# Patient Record
Sex: Female | Born: 1965
Health system: Southern US, Community
[De-identification: ages and names within clinical notes are randomized; demographics above are authoritative.]

## PROBLEM LIST (undated history)

## (undated) DIAGNOSIS — E785 Hyperlipidemia, unspecified: Secondary | ICD-10-CM

## (undated) DIAGNOSIS — F329 Major depressive disorder, single episode, unspecified: Secondary | ICD-10-CM

## (undated) DIAGNOSIS — I10 Essential (primary) hypertension: Secondary | ICD-10-CM

## (undated) DIAGNOSIS — K219 Gastro-esophageal reflux disease without esophagitis: Secondary | ICD-10-CM

## (undated) DIAGNOSIS — F32A Depression, unspecified: Secondary | ICD-10-CM

## (undated) HISTORY — PX: VESICOVAGINAL FISTULA CLOSURE W/ TAH: SUR271

## (undated) HISTORY — PX: GANGLION CYST EXCISION: SHX1691

## (undated) HISTORY — DX: Depression, unspecified: F32.A

## (undated) HISTORY — PX: ROTATOR CUFF REPAIR: SHX139

## (undated) HISTORY — PX: LUMBAR MICRODISCECTOMY: SHX99

## (undated) HISTORY — DX: Gastro-esophageal reflux disease without esophagitis: K21.9

## (undated) HISTORY — DX: Hyperlipidemia, unspecified: E78.5

## (undated) HISTORY — PX: ABDOMINAL HYSTERECTOMY: SHX81

## (undated) HISTORY — PX: MARSUPIALIZATION URETHRAL DIVERTICULUM: SUR844

## (undated) HISTORY — DX: Essential (primary) hypertension: I10

## (undated) HISTORY — DX: Major depressive disorder, single episode, unspecified: F32.9

---

## 1997-11-07 ENCOUNTER — Encounter: Admission: RE | Admit: 1997-11-07 | Discharge: 1998-02-05 | Payer: Self-pay | Admitting: Orthopedic Surgery

## 1999-01-22 ENCOUNTER — Other Ambulatory Visit: Admission: RE | Admit: 1999-01-22 | Discharge: 1999-01-22 | Payer: Self-pay | Admitting: Obstetrics and Gynecology

## 1999-02-18 ENCOUNTER — Emergency Department (HOSPITAL_COMMUNITY): Admission: EM | Admit: 1999-02-18 | Discharge: 1999-02-18 | Payer: Self-pay | Admitting: *Deleted

## 1999-02-18 ENCOUNTER — Encounter: Payer: Self-pay | Admitting: *Deleted

## 1999-05-26 ENCOUNTER — Other Ambulatory Visit: Admission: RE | Admit: 1999-05-26 | Discharge: 1999-05-26 | Payer: Self-pay | Admitting: Obstetrics and Gynecology

## 1999-07-20 ENCOUNTER — Encounter: Payer: Self-pay | Admitting: Orthopedic Surgery

## 1999-07-20 ENCOUNTER — Encounter: Admission: RE | Admit: 1999-07-20 | Discharge: 1999-07-20 | Payer: Self-pay | Admitting: Orthopedic Surgery

## 1999-08-26 ENCOUNTER — Encounter: Payer: Self-pay | Admitting: Orthopedic Surgery

## 1999-08-28 ENCOUNTER — Encounter (INDEPENDENT_AMBULATORY_CARE_PROVIDER_SITE_OTHER): Payer: Self-pay | Admitting: *Deleted

## 1999-08-28 ENCOUNTER — Ambulatory Visit (HOSPITAL_COMMUNITY): Admission: RE | Admit: 1999-08-28 | Discharge: 1999-08-30 | Payer: Self-pay | Admitting: Orthopedic Surgery

## 2000-02-12 ENCOUNTER — Other Ambulatory Visit: Admission: RE | Admit: 2000-02-12 | Discharge: 2000-02-12 | Payer: Self-pay | Admitting: *Deleted

## 2000-04-25 ENCOUNTER — Encounter: Payer: Self-pay | Admitting: *Deleted

## 2000-04-25 ENCOUNTER — Ambulatory Visit (HOSPITAL_COMMUNITY): Admission: RE | Admit: 2000-04-25 | Discharge: 2000-04-25 | Payer: Self-pay | Admitting: *Deleted

## 2000-07-11 ENCOUNTER — Encounter: Payer: Self-pay | Admitting: Obstetrics and Gynecology

## 2000-07-11 ENCOUNTER — Ambulatory Visit (HOSPITAL_COMMUNITY): Admission: RE | Admit: 2000-07-11 | Discharge: 2000-07-11 | Payer: Self-pay | Admitting: Obstetrics and Gynecology

## 2000-07-22 ENCOUNTER — Inpatient Hospital Stay (HOSPITAL_COMMUNITY): Admission: AD | Admit: 2000-07-22 | Discharge: 2000-07-22 | Payer: Self-pay | Admitting: Obstetrics and Gynecology

## 2000-08-02 ENCOUNTER — Inpatient Hospital Stay (HOSPITAL_COMMUNITY): Admission: AD | Admit: 2000-08-02 | Discharge: 2000-08-02 | Payer: Self-pay | Admitting: Obstetrics and Gynecology

## 2000-08-12 ENCOUNTER — Ambulatory Visit (HOSPITAL_COMMUNITY): Admission: RE | Admit: 2000-08-12 | Discharge: 2000-08-12 | Payer: Self-pay | Admitting: Obstetrics and Gynecology

## 2000-08-12 ENCOUNTER — Encounter: Payer: Self-pay | Admitting: Obstetrics and Gynecology

## 2000-08-25 ENCOUNTER — Inpatient Hospital Stay (HOSPITAL_COMMUNITY): Admission: AD | Admit: 2000-08-25 | Discharge: 2000-08-25 | Payer: Self-pay | Admitting: Obstetrics and Gynecology

## 2000-09-11 ENCOUNTER — Inpatient Hospital Stay (HOSPITAL_COMMUNITY): Admission: AD | Admit: 2000-09-11 | Discharge: 2000-09-14 | Payer: Self-pay | Admitting: Obstetrics and Gynecology

## 2000-09-16 ENCOUNTER — Encounter: Admission: RE | Admit: 2000-09-16 | Discharge: 2000-10-16 | Payer: Self-pay | Admitting: Obstetrics and Gynecology

## 2000-11-30 ENCOUNTER — Encounter: Admission: RE | Admit: 2000-11-30 | Discharge: 2001-02-28 | Payer: Self-pay | Admitting: Internal Medicine

## 2001-04-03 ENCOUNTER — Other Ambulatory Visit: Admission: RE | Admit: 2001-04-03 | Discharge: 2001-04-03 | Payer: Self-pay | Admitting: Obstetrics and Gynecology

## 2001-07-17 ENCOUNTER — Encounter: Admission: RE | Admit: 2001-07-17 | Discharge: 2001-07-17 | Payer: Self-pay | Admitting: Internal Medicine

## 2001-07-24 ENCOUNTER — Observation Stay (HOSPITAL_COMMUNITY): Admission: AD | Admit: 2001-07-24 | Discharge: 2001-07-26 | Payer: Self-pay | Admitting: Internal Medicine

## 2001-07-24 ENCOUNTER — Encounter: Payer: Self-pay | Admitting: Internal Medicine

## 2002-06-25 ENCOUNTER — Other Ambulatory Visit: Admission: RE | Admit: 2002-06-25 | Discharge: 2002-06-25 | Payer: Self-pay | Admitting: Obstetrics and Gynecology

## 2002-07-04 ENCOUNTER — Ambulatory Visit (HOSPITAL_COMMUNITY): Admission: RE | Admit: 2002-07-04 | Discharge: 2002-07-04 | Payer: Self-pay | Admitting: Obstetrics and Gynecology

## 2002-07-04 ENCOUNTER — Encounter: Payer: Self-pay | Admitting: Obstetrics and Gynecology

## 2002-10-04 ENCOUNTER — Encounter: Admission: RE | Admit: 2002-10-04 | Discharge: 2002-10-04 | Payer: Self-pay | Admitting: Internal Medicine

## 2002-11-09 ENCOUNTER — Encounter: Payer: Self-pay | Admitting: Internal Medicine

## 2002-11-09 ENCOUNTER — Encounter: Admission: RE | Admit: 2002-11-09 | Discharge: 2002-11-09 | Payer: Self-pay | Admitting: Internal Medicine

## 2003-07-26 ENCOUNTER — Emergency Department (HOSPITAL_COMMUNITY): Admission: AD | Admit: 2003-07-26 | Discharge: 2003-07-26 | Payer: Self-pay | Admitting: Family Medicine

## 2004-06-17 ENCOUNTER — Encounter: Admission: RE | Admit: 2004-06-17 | Discharge: 2004-06-17 | Payer: Self-pay | Admitting: Occupational Medicine

## 2004-11-26 ENCOUNTER — Emergency Department (HOSPITAL_COMMUNITY): Admission: EM | Admit: 2004-11-26 | Discharge: 2004-11-26 | Payer: Self-pay | Admitting: Family Medicine

## 2004-12-17 ENCOUNTER — Encounter: Admission: RE | Admit: 2004-12-17 | Discharge: 2004-12-17 | Payer: Self-pay | Admitting: Orthopedic Surgery

## 2005-01-18 ENCOUNTER — Ambulatory Visit (HOSPITAL_COMMUNITY): Admission: RE | Admit: 2005-01-18 | Discharge: 2005-01-18 | Payer: Self-pay | Admitting: Obstetrics and Gynecology

## 2005-10-10 ENCOUNTER — Emergency Department (HOSPITAL_COMMUNITY): Admission: AD | Admit: 2005-10-10 | Discharge: 2005-10-10 | Payer: Self-pay | Admitting: Family Medicine

## 2005-10-13 ENCOUNTER — Ambulatory Visit: Payer: Self-pay | Admitting: Internal Medicine

## 2006-01-27 ENCOUNTER — Ambulatory Visit: Payer: Self-pay | Admitting: Internal Medicine

## 2006-03-15 ENCOUNTER — Ambulatory Visit (HOSPITAL_COMMUNITY): Admission: RE | Admit: 2006-03-15 | Discharge: 2006-03-15 | Payer: Self-pay | Admitting: Obstetrics and Gynecology

## 2006-06-24 ENCOUNTER — Observation Stay (HOSPITAL_COMMUNITY): Admission: RE | Admit: 2006-06-24 | Discharge: 2006-06-25 | Payer: Self-pay | Admitting: Obstetrics & Gynecology

## 2006-06-24 ENCOUNTER — Encounter (INDEPENDENT_AMBULATORY_CARE_PROVIDER_SITE_OTHER): Payer: Self-pay | Admitting: Specialist

## 2006-09-16 ENCOUNTER — Emergency Department (HOSPITAL_COMMUNITY): Admission: EM | Admit: 2006-09-16 | Discharge: 2006-09-16 | Payer: Self-pay | Admitting: Family Medicine

## 2006-12-30 ENCOUNTER — Emergency Department (HOSPITAL_COMMUNITY): Admission: EM | Admit: 2006-12-30 | Discharge: 2006-12-30 | Payer: Self-pay | Admitting: Emergency Medicine

## 2007-03-20 ENCOUNTER — Ambulatory Visit (HOSPITAL_COMMUNITY): Admission: RE | Admit: 2007-03-20 | Discharge: 2007-03-20 | Payer: Self-pay | Admitting: Obstetrics and Gynecology

## 2007-05-20 ENCOUNTER — Emergency Department (HOSPITAL_COMMUNITY): Admission: EM | Admit: 2007-05-20 | Discharge: 2007-05-20 | Payer: Self-pay | Admitting: Emergency Medicine

## 2007-11-20 ENCOUNTER — Emergency Department (HOSPITAL_COMMUNITY): Admission: EM | Admit: 2007-11-20 | Discharge: 2007-11-20 | Payer: Self-pay | Admitting: Emergency Medicine

## 2008-03-25 ENCOUNTER — Ambulatory Visit (HOSPITAL_COMMUNITY): Admission: RE | Admit: 2008-03-25 | Discharge: 2008-03-25 | Payer: Self-pay | Admitting: Obstetrics and Gynecology

## 2008-08-12 ENCOUNTER — Emergency Department (HOSPITAL_COMMUNITY): Admission: EM | Admit: 2008-08-12 | Discharge: 2008-08-12 | Payer: Self-pay | Admitting: Emergency Medicine

## 2008-09-11 ENCOUNTER — Ambulatory Visit: Payer: Self-pay | Admitting: Sports Medicine

## 2008-09-11 DIAGNOSIS — M25539 Pain in unspecified wrist: Secondary | ICD-10-CM | POA: Insufficient documentation

## 2008-09-11 DIAGNOSIS — M6740 Ganglion, unspecified site: Secondary | ICD-10-CM | POA: Insufficient documentation

## 2008-11-30 ENCOUNTER — Emergency Department (HOSPITAL_COMMUNITY): Admission: EM | Admit: 2008-11-30 | Discharge: 2008-11-30 | Payer: Self-pay | Admitting: Family Medicine

## 2009-01-17 ENCOUNTER — Ambulatory Visit: Payer: Self-pay | Admitting: Family Medicine

## 2009-01-17 DIAGNOSIS — M76899 Other specified enthesopathies of unspecified lower limb, excluding foot: Secondary | ICD-10-CM | POA: Insufficient documentation

## 2009-01-17 DIAGNOSIS — M2170 Unequal limb length (acquired), unspecified site: Secondary | ICD-10-CM | POA: Insufficient documentation

## 2009-02-04 ENCOUNTER — Ambulatory Visit: Payer: Self-pay | Admitting: Sports Medicine

## 2009-04-25 ENCOUNTER — Encounter: Admission: RE | Admit: 2009-04-25 | Discharge: 2009-04-25 | Payer: Self-pay | Admitting: Internal Medicine

## 2009-09-24 ENCOUNTER — Ambulatory Visit (HOSPITAL_COMMUNITY): Admission: RE | Admit: 2009-09-24 | Discharge: 2009-09-24 | Payer: Self-pay | Admitting: Internal Medicine

## 2010-02-11 ENCOUNTER — Encounter: Admission: RE | Admit: 2010-02-11 | Discharge: 2010-05-08 | Payer: Self-pay | Admitting: Internal Medicine

## 2010-06-20 ENCOUNTER — Ambulatory Visit: Payer: Self-pay | Admitting: Diagnostic Radiology

## 2010-06-20 ENCOUNTER — Emergency Department (HOSPITAL_BASED_OUTPATIENT_CLINIC_OR_DEPARTMENT_OTHER): Admission: EM | Admit: 2010-06-20 | Discharge: 2010-06-20 | Payer: Self-pay | Admitting: Emergency Medicine

## 2010-09-10 ENCOUNTER — Encounter (HOSPITAL_COMMUNITY)
Admission: RE | Admit: 2010-09-10 | Discharge: 2010-09-10 | Disposition: A | Discharge status: Home / Self Care | Payer: 59 | Source: Ambulatory Visit | Attending: Obstetrics & Gynecology | Admitting: Obstetrics & Gynecology

## 2010-09-10 DIAGNOSIS — Z01812 Encounter for preprocedural laboratory examination: Secondary | ICD-10-CM | POA: Insufficient documentation

## 2010-09-10 LAB — CBC
Hemoglobin: 10.6 g/dL — ABNORMAL LOW (ref 12.0–15.0)
MCH: 26.9 pg (ref 26.0–34.0)
MCV: 84 fL (ref 78.0–100.0)
Platelets: 267 10*3/uL (ref 150–400)
RBC: 3.94 MIL/uL (ref 3.87–5.11)
WBC: 10.5 10*3/uL (ref 4.0–10.5)

## 2010-09-10 LAB — BASIC METABOLIC PANEL
BUN: 8 mg/dL (ref 6–23)
CO2: 30 mEq/L (ref 19–32)
Calcium: 9.1 mg/dL (ref 8.4–10.5)
Chloride: 101 mEq/L (ref 96–112)
Creatinine, Ser: 0.92 mg/dL (ref 0.4–1.2)
GFR calc Af Amer: >60 mL/min (ref >60)
Glucose, Bld: 96 mg/dL (ref 70–99)
Potassium: 3.3 mEq/L — ABNORMAL LOW (ref 3.5–5.1)
Sodium: 137 mEq/L (ref 135–145)

## 2010-09-18 ENCOUNTER — Ambulatory Visit (HOSPITAL_COMMUNITY)
Admission: RE | Admit: 2010-09-18 | Discharge: 2010-09-18 | Disposition: A | Discharge status: Home / Self Care | Payer: 59 | Source: Ambulatory Visit | Attending: Obstetrics & Gynecology | Admitting: Obstetrics & Gynecology

## 2010-09-18 DIAGNOSIS — N750 Cyst of Bartholin's gland: Secondary | ICD-10-CM | POA: Insufficient documentation

## 2010-09-18 LAB — PREGNANCY, URINE: Preg Test, Ur: NEGATIVE

## 2010-09-18 LAB — GLUCOSE, CAPILLARY: Glucose-Capillary: 103 mg/dL — ABNORMAL HIGH (ref 70–99)

## 2010-10-20 LAB — COMPREHENSIVE METABOLIC PANEL
ALT: 21 U/L (ref 0–35)
AST: 19 U/L (ref 0–37)
Albumin: 3.9 g/dL (ref 3.5–5.2)
Alkaline Phosphatase: 81 U/L (ref 39–117)
BUN: 9 mg/dL (ref 6–23)
CO2: 27 mEq/L (ref 19–32)
Calcium: 9.2 mg/dL (ref 8.4–10.5)
Chloride: 104 mEq/L (ref 96–112)
Creatinine, Ser: 0.8 mg/dL (ref 0.4–1.2)
GFR calc Af Amer: >60 mL/min (ref >60)
GFR calc non Af Amer: >60 mL/min (ref >60)
Glucose, Bld: 111 mg/dL — ABNORMAL HIGH (ref 70–99)
Potassium: 3.9 mEq/L (ref 3.5–5.1)
Sodium: 143 mEq/L (ref 135–145)
Total Bilirubin: 0.4 mg/dL (ref 0.3–1.2)
Total Protein: 7.6 g/dL (ref 6.0–8.3)

## 2010-10-20 LAB — CBC
HCT: 31.5 % — ABNORMAL LOW (ref 36.0–46.0)
Hemoglobin: 10.8 g/dL — ABNORMAL LOW (ref 12.0–15.0)
MCH: 28.4 pg (ref 26.0–34.0)
MCHC: 34.3 g/dL (ref 30.0–36.0)
MCV: 82.8 fL (ref 78.0–100.0)
Platelets: 242 10*3/uL (ref 150–400)
RBC: 3.8 MIL/uL — ABNORMAL LOW (ref 3.87–5.11)
RDW: 14.4 % (ref 11.5–15.5)
WBC: 9.7 10*3/uL (ref 4.0–10.5)

## 2010-10-20 LAB — DIFFERENTIAL
Basophils Absolute: 0 10*3/uL (ref 0.0–0.1)
Basophils Relative: 0 % (ref 0–1)
Eosinophils Absolute: 0 10*3/uL (ref 0.0–0.7)
Eosinophils Relative: 0 % (ref 0–5)
Lymphocytes Relative: 25 % (ref 12–46)
Lymphs Abs: 2.4 10*3/uL (ref 0.7–4.0)
Monocytes Absolute: 0.6 10*3/uL (ref 0.1–1.0)
Monocytes Relative: 6 % (ref 3–12)
Neutro Abs: 6.7 10*3/uL (ref 1.7–7.7)
Neutrophils Relative %: 69 % (ref 43–77)

## 2010-10-20 LAB — URINALYSIS, ROUTINE W REFLEX MICROSCOPIC
Bilirubin Urine: NEGATIVE
Glucose, UA: NEGATIVE mg/dL
Hgb urine dipstick: NEGATIVE
Ketones, ur: NEGATIVE mg/dL
Nitrite: NEGATIVE
Protein, ur: NEGATIVE mg/dL
Specific Gravity, Urine: 1.012 (ref 1.005–1.030)
Urobilinogen, UA: 0.2 mg/dL (ref 0.0–1.0)
pH: 6.5 (ref 5.0–8.0)

## 2010-10-20 LAB — LIPASE, BLOOD: Lipase: 48 U/L (ref 23–300)

## 2010-10-21 NOTE — Op Note (Signed)
  NAME:  Denise Stevens, Denise Stevens                ACCOUNT NO.:  [id number]  MEDICAL RECORD NO.:  [id number]           PATIENT TYPE:  O  LOCATION:  [facility]                          FACILITY:  [facility]  PHYSICIAN:  Genia Del, M.D.DATE OF BIRTH:  1966/02/07  DATE OF PROCEDURE:  09/18/2010 DATE OF DISCHARGE:                              OPERATIVE REPORT   PREOPERATIVE DIAGNOSIS:  Right recurrent Bartholin gland cyst.  POSTOPERATIVE DIAGNOSIS:  Right recurrent Bartholin gland cyst.  PROCEDURE:  Right Bartholin gland marsupialization.  SURGEON:  Genia Del, MD  ASSISTANT:  None.  PROCEDURE:  Under general anesthesia with endotracheal intubation, the patient was in lithotomy position.  She was prepped with Betadine on the suprapubic vulvar and vaginal areas and draped as usual.  The bladder was catheterized.  We palpated the right Bartholin gland which was not very large at this point, maximum 2 cm.  We incised at the site of previous drainage with the scalpel over 1 cm.  We drained the Bartholin gland.  A dark fluid was evacuated, probably old blood as part of it. No evidence of pus.  We verified hemostasis and it was good.  There was no loculation inside.  We then proceeded with the marsupialization.  We made separate stitches with Vicryl Rapide 4-0 all around the opening starting at 12 and coming back to the same place with about 6 stitches. Hemostasis was adequate.  We infiltrated the subcutaneous tissue with lidocaine 1%, 10 mL.  We packed with a very small length of half plain packing.  The estimated blood loss was minimal.  The patient received Flagyl 500 IV before the procedure.  The count of instruments and sponges was complete.  The patient was brought to the recovery room in good and stable status.     Genia Del, M.D.     ML/MEDQ  D:  09/18/2010  T:  09/19/2010  Job:  [id number]  Electronically Signed by Genia Del M.D. on 10/21/2010 06:22:37 PM

## 2010-11-02 ENCOUNTER — Other Ambulatory Visit (HOSPITAL_COMMUNITY): Payer: Self-pay | Admitting: Obstetrics & Gynecology

## 2010-11-02 DIAGNOSIS — Z1231 Encounter for screening mammogram for malignant neoplasm of breast: Secondary | ICD-10-CM

## 2010-11-24 ENCOUNTER — Ambulatory Visit (HOSPITAL_COMMUNITY)
Admission: RE | Admit: 2010-11-24 | Discharge: 2010-11-24 | Disposition: A | Discharge status: Home / Self Care | Payer: 59 | Source: Ambulatory Visit | Attending: Obstetrics & Gynecology | Admitting: Obstetrics & Gynecology

## 2010-11-24 DIAGNOSIS — Z1231 Encounter for screening mammogram for malignant neoplasm of breast: Secondary | ICD-10-CM | POA: Insufficient documentation

## 2010-12-25 NOTE — Op Note (Signed)
Denise Stevens, Denise Stevens                ACCOUNT NO.:  [id number]   MEDICAL RECORD NO.:  [id number]          PATIENT TYPE:  INP   LOCATION:  1619                         FACILITY:  [facility]   PHYSICIAN:  Genia Del, M.D.DATE OF BIRTH:  06-Mar-1966   DATE OF PROCEDURE:  06/24/2006  DATE OF DISCHARGE:                               OPERATIVE REPORT   PREOPERATIVE DIAGNOSIS:  Menorrhagia secondary to uterine myoma.   POSTOPERATIVE DIAGNOSIS:  Menorrhagia secondary to uterine myoma.   INTERVENTION:  Total laparoscopy-assisted hysterectomy with Engineer, building services  robot.   SURGEON:  Genia Del, M.D.   ASSISTANT:  Richardean Sale, M.D.   PROCEDURE:  Under general anesthesia with endotracheal intubation, the  patient is in lithotomy position.  She is prepped with Betadine in the  abdominal, suprapubic vulvar and vaginal areas and draped as usual.  The  bladder is catheterized and the RUMI with the co-ring are put vaginally  to manipulate the uterus.  We measure the first trocar position for the  camera at about 5 cm above the umbilicus.  We inject with Marcaine 0.25%  plain at all incision sites and use the scalpel to make the incision.  We open layer by layer at the supraumbilical incision.  We open the  aponeurosis with Mayo scissors under direct vision and open the parietal  peritoneum bluntly with the finger.  We then put a pursestring stitch of  0 Vicryl at the aponeurosis and insert the Hasson at that level.  The  camera was inserted.  The abdominal and pelvic cavities are within  normal with no adhesions.  We have created a pneumoperitoneum with CO2.  We then measure all other trocar position sites make the incision with a  scalpel at all levels after injecting with Marcaine.  We insert the  trocars at those levels.  The two robotic arms are put in place.  The  fourth arm of the robot was put in but because of crowding with the left  arm, the decision was made not to pursue that fourth  arm.  We also  insert the assistant port.  We put the patient in steep Trendelenburg,  the robot is docked and the instruments are put in place, a fenestrated  bipolar on the left arm and the shear scissors on the right arm.  We  start then at the console.  We start on the left side, coagulating and  sectioning successively the left round ligament, the left tube and the  left utero-ovarian ligament.  The left ovary is normal in appearance and  will be left in place.  We then go down on the lateral side of the  uterus, open the visceral peritoneum over the lower uterine segment  anteriorly and recline the bladder downward.  We proceed exactly the  same way on the right side.  We then coagulate and section the left  uterine artery and the right uterine artery.  We see the uterus  blanching after that.  We then push the co-ring in as much as possible  and use that as a guide to make the  circumferential incision with the  tip of the shear scissors all around the superior vaginal vault.  The  uterus is completely detached that way and removed from the pelvic  cavity.  It is left in the mid vagina to assure that we will keep the  pneumoperitoneum during the closure of the vaginal vault.  We change  instruments to two needle drivers and close the vaginal vault with 0  Vicryl on a CT1.  We closed with figure-of-eights all along the vaginal  vault.  Hemostasis is adequate at all levels.  The abdominopelvic cavity  is irrigated and suctioned.  All instruments are then removed.  We  undock the robot and evacuate the CO2.  Trocars are then removed and the  suture at the aponeurosis of the supraumbilical incision is  attached.  We close the skin with a subcuticular Vicryl 4-0 at that  incision and at the assistant port.  We close the other incisions with  Dermabond.  The count of instruments and sponges was complete.  The  estimated blood loss was 100 mL.  No complications occurred, and the  patient  was brought to recovery room in good status.      Genia Del, M.D.  Electronically Signed     ML/MEDQ  D:  06/24/2006  T:  06/24/2006  Job:  [id number]

## 2010-12-25 NOTE — Discharge Summary (Signed)
[facility]  Patient:    Denise Stevens, Denise Stevens                       MRN: [id number] Adm. Date:  [id number] Disc. Date: [id number] Attending:  Shaune Spittle Dictator:   Mack Guise, C.N.M.                           Discharge Summary  ADMITTING DIAGNOSES:          Intrauterine pregnancy at 38 weeks, chronic high blood pressure, preeclampsia superimposed on chronic high blood pressure, Bells palsy, fetal macrosomia, maternal age 44, amniocentesis declined.  PROCEDURE:                    Normal spontaneous vaginal delivery, repair of second degree midline laceration.  DISCHARGE DIAGNOSES:          Intrauterine pregnancy at term, delivered, preeclampsia, Bells palsy, normal spontaneous vaginal delivery, repair of second degree midline laceration.  HISTORY:                      Ms. Tullius is a 45 year old gravida 2, para 1-0-0-1 at 21 weeks who presents with complaint of right-sided facial weakness and was subsequently diagnosed with Bells palsy.  On admission her blood pressures were elevated and she has a history of chronic high blood pressure. Proteinuria on admission signified preeclampsia and decision was made for induction of labor.  She went on to a normal spontaneous vaginal delivery with the birth of a 7 pound 2 ounce female infant named Lobbyist.  Apgars were 8 at one minute, 9 at five minutes.  Ms. Salmi has done well in the postpartum period and was continued on magnesium sulfate for 24 hours and then begun on Aldomet 500 p.o. t.i.d.  Her hemoglobin on the first postpartum day was 10.0.  Her blood pressures have been stable and on the second postpartum day she is judged to be in satisfactory condition for discharge.  DISCHARGE INSTRUCTIONS:       Per [facility] handout.  DISCHARGE MEDICATIONS:        1. Motrin 600 mg p.o. q.6h. p.r.n. pain.                               2. Prenatal vitamins.  DISCHARGE FOLLOW-UP:          At  [facility] in six weeks. DD:  09/14/00 TD:  09/14/00 Job: [id number]

## 2010-12-25 NOTE — Discharge Summary (Signed)
[place]. [facility]  Patient:    Denise Stevens, Denise Stevens Visit Number: [id number] MRN: [id number]          Service Type: MED Location: [phone] Attending Physician:  Alva Garnet. Dictated by:   Merlene Laughter Renae Gloss, M.D. Admit Date:  07/24/2001 Discharge Date: 07/26/2001                             Discharge Summary  DISCHARGE DIAGNOSES: 1. Atypical chest pain. 2. Hypertension. 3. Obesity. 4. Hyperlipidemia. 5. Hypokalemia.  HOSPITAL COURSE:  Denise Stevens presented with the sudden onset of chest pain that radiates to her back and was associated with nausea and dizziness.  It was also noted that she had an abnormal EKG with T wave inversions in V2 through V6.  She was admitted for further cardiology evaluation.  Her cardiac enzymes including her CK-MB and troponin I enzymes were unremarkable as well as her telemetry.  However given recurrence of chest pain while hospitalized as well as significant cardiac risk factors including obesity, hypertension, family history of hypertension, it was decided that a catheterization procedure would be the best diagnostic study to rule out myocardial ischemia. Her cardiac catheterization was done on day #2 of admission and was absolutely normal.  Protonix was also begun on day #2 of admission and she did not have any further serious chest pain episodes.  I suspected her chest pain was largely due to a GI etiology such as gastroesophageal reflux disease or maybe esophageal spasm.  She will continue Protonix for now and further GI workup will be persued as an outpatient if her chest pain persists or worsens.  DISCHARGE MEDICATIONS: 1. Celexa 20 mg one p.o. q.h.s. 2. Maxzide 37.5/25 one p.o. q.d. 3. Protonix 40 mg p.o. q.d. 4. Potassium 10 mEq p.o. q.d.  FOLLOW-UP:  Denise Stevens will be seen by Merlene Laughter. Renae Gloss, M.D. on Thursday, December 19, at 10 a.m.  DISPOSITION:  Upon discharge, Denise Stevens had had no  further episodes of chest pain. She was ambulating and eating without complications. Dictated by:   Merlene Laughter Renae Gloss, M.D. Attending Physician:  Andi Devon R. DD:  07/26/01 TD:  07/26/01 Job: [id number]

## 2010-12-25 NOTE — H&P (Signed)
[place]. [facility]  Patient:    Denise Stevens, Denise Stevens Visit Number: [id number] MRN: [id number]          Service Type: MED Location: [phone] Attending Physician:  Alva Garnet. Dictated by:   Merlene Laughter Renae Gloss, M.D. Admit Date:  07/24/2001                           History and Physical  CHIEF COMPLAINT:  Chest pain.  HISTORY OF PRESENT ILLNESS:  Denise Stevens is a 45 year old lady who presents with a sudden one-day history of midsternal chest pain which radiates to the back.  She states that the pain is associated with nausea and vomiting as well as dizziness.  She denies shortness of breath or palpitations.  She has had no previous episodes of chest pain.  EKG that was obtained in the office revealed ST depression in III and aVF as well as T wave inversions in V2 through V6. She is admitted for further evaluation of her chest pain.  ALLERGIES:  HYDROCODONE.  MEDICATIONS:  Maxzide 37.5/25 one p.o. q day, Celexa 20 mg p.o. q day.  PAST MEDICAL HISTORY:  Hypertension, obesity, hyperlipidemia.  FAMILY HISTORY:  Consistent with diabetes in mother and grandmother, hypertension in mother, heart disease in grandmother.  SOCIAL HISTORY:  Denise Stevens is employed at [employer].  She denies alcohol, tobacco, or drugs of abuse.  REVIEW OF SYSTEMS:  As per HPI and patient history assessment.  Greater than 10 systems were reviewed.  PHYSICAL EXAMINATION:  GENERAL APPEARANCE:  Well-developed, well-nourished black female in no acute distress.  VITAL SIGNS:  Blood pressure 142/84, pulse 96.  Height five feet six inches. Weight 296.5 pounds.  Temperature 97.5 degrees.  HEENT:  TMs within normal limits bilaterally.  PERRLA.  NECK:  Supple, no masses.  Carotids 2+.  No bruits.  No thyromegaly.  LUNGS:  Clear to auscultation bilaterally.  HEART:  S1, S2, regular rate and rhythm.  No murmurs, rubs, or gallops.  ABDOMEN:  Soft, nontender,  nondistended.  Positive bowel sounds.  EXTREMITIES:  No clubbing, cyanosis, or edema.  NEUROLOGIC:  Alert and oriented x 3.  Cranial nerves intact.  ASSESSMENT AND PLAN: 1. Precordial chest pain.  Denise Stevens chest pain is concerning for a    possible cardiac etiology, especially considering her cardiac risk factors    which include hyperlipidemia, obesity, hypertension, and family history.    She will be admitted to telemetry and serial EKG and troponin as well as    CK-MB labs will be obtained.  A cardiology consult will be obtained since    she does have several risk factors as well as an abnormal EKG. 2. Hypertension.  Improving control.  Her blood pressure will be closely    monitored and her antihypertensive regimen will be adjusted if needed. 3. Hyperlipidemia.  In April 2002, Denise Stevens total cholesterol was 206 with    an LDL of 139.  Her triglyceride level was 124, HDL 42.  She has been    trying to reduce her cholesterol values with behavior modifications which    have been strongly emphasized as an outpatient. 4. Obesity. Dictated by:   Merlene Laughter Renae Gloss, M.D. Attending Physician:  Andi Devon R. DD:  07/24/01 TD:  07/24/01 Job: [id number]
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
## 2010-12-25 NOTE — Consult Note (Signed)
[facility]  Patient:    Denise Stevens, Denise Stevens                       MRN: [id number] Adm. Date:  [id number] Attending:  Shaune Spittle                          Consultation Report  EMERGENCY ROOM CONSULT  DATE OF BIRTH:                Jun 06, 1966  REQUESTING PHYSICIAN:         Dr. Dierdre Forth.  REASON FOR EVALUATION:        Right facial weakness.  HISTORY OF PRESENT ILLNESS:   The initial emergency room consultation evaluation of this 45 year old, black female with past medical history of pregnancy induced hypertension and obesity who presents to the Maternity Admissions Unit with weakness of the right face of about four hours duration. The patient reports that she noticed about 3 oclock this afternoon that she was having some "film" over the right eye with some visual changes. She also noticed that she was not talking very well, her speech was a little bit slurred. She also then went to drink from a straw and found that she was unable to pucker her lips due to weakness on the right side. She reports having a little bit of an ear infection for the past few days but has not really had any pain in that distribution. Other than the film over the eye, she has had no visual changes and denies diplopia. She has had no facial numbness. She has no weakness or numbness of the extremities, no dysphagia, no bowel or bladder symptoms, no changes in gait, and no falls. She has never had anything like this before.  PAST MEDICAL HISTORY:         Remarkable for pregnancy induced hypertension with this pregnancy and with her first. She is presently 38 weeks into an intrauterine pregnancy and is actually presently being admitted over concern over preeclampsia.  REVIEW OF SYSTEMS:            Neurologic: See above. Specifically, no facial numbness. No diplopia. No dysphasia. No extremity weakness or numbness. No bowel or bladder symptoms. No gait changes. No  falls.  PHYSICAL EXAMINATION:  VITAL SIGNS:                  Blood pressure 140-164/80-102, pulse 100-120, respirations 20.  GENERAL:                      She is alert in no evidence of distress. Her speech is fluent and not dysarthric.  NEUROLOGIC:                   Mental status: She is awake, alert, and completely oriented. The speech is not dysarthric. Mood is euthymic and affect appropriate. Cranial nerves, funduscopic exam is benign. Pupils are equal and briskly reactive. Extraocular movements are normal. Visual fields are full to confrontation. Visual acuity is 20/20 OS and 20/25 OD. There is an incomplete right facial paresis with weakness of frontalis and eye closure muscles as well as intrinsic muscles of the face. Tongue and palate move normally in the midline. Facial sensation is normal. Motor exam: Normal bulk and tone. Normal strength in all tested extremity muscles. Sensation: Tactile, light touch, vibration, and pinprick in all extremities. Cerebellar: Rapid alternating  movements are normal. Finger-to-nose is performed well. Reflexes are 2+ and symmetric. Gait exam is deferred.  IMPRESSION:                   1. Right facial nerve palsy (Bells palsy).                               2. A 38-week intrauterine pregnancy, with                                  pregnancy induced hypertension and presumed                                  preeclampsia.  RECOMMENDATIONS:              1. Due to the patients gravid state, would not                                  treat with medications at this time. She has                                  an incomplete lesion and her prognosis for                                  full recovery is very favorable.                               2. Advised her to wear an eye patch. I advised                                  to keep the eye patched at night and                                  intermittently throughout the day and to use                                   artificial tears to keep the right eye from                                  drying out.                               3. If pain develops, would treat with pain                                  medications, trial of low-dose Neurontin,  100 to 300 mg t.i.d. may be helpful.                               4. Further neuro imaging or neurological workup                                  is not necessary at this time.                               5. If symptoms persist, she may call [facility] at [phone] for                                  followup. More likely, symptoms will                                  resolve in a few weeks. DD:  09/11/00 TD:  09/12/00 Job: [id number]

## 2010-12-25 NOTE — Cardiovascular Report (Signed)
[place]. [facility]  Patient:    FORREST, DEMURO Visit Number: [id number] MRN: [id number]          Service Type: MED Location: [phone] Attending Physician:  Alva Garnet. Dictated by:   Pearletha Furl Alanda Amass, M.D. Proc. Date: 07/25/01 Admit Date:  07/24/2001 Discharge Date: 07/26/2001   CC:         Cala Bradford R. Renae Gloss, M.D.  CP Lab  Lenise Herald, M.D.   Cardiac Catheterization  PROCEDURES:  Retrograde central aortic catheterization; selective coronary angiography by Judkins technique; left ventricular angiogram RAO, LAO projection; abdominal aortic angiogram, midstream PA projection.  DESCRIPTION OF PROCEDURE:  The patient was brought to the second floor CP lab in a postabsorptive state after premedication with 5 mg Valium p.o.  She was given 2 mg of Versed and 2 mg of Nubain for sedation in the lab in divided doses.  Xylocaine 1% was used for local anesthesia.  The RFA was entered with a single anterior puncture using an 18 thin-walled needle, and a #4 [place] short Cordis sidearm sheath was inserted without difficulty.  Catheterization was done with 4 cm tapered 4 [place] Cordis preformed coronary and pigtail catheters.  LV angiogram was done in the RAO and LAO projection at 25 cc, 14 cc per second, 20 cc, 12 cc per second, respectively.  Pullback pressure to the CA showed no gradient across the aortic valve.  Abdominal aortic angiogram was done above the level of the renal arteries in the midstream PA projection due to the patients known hypertension at 25 cc, 20 cc per second.  Catheters were removed, sidearm sheath was flushed.  Patient tolerated the procedure well.  She was brought to the holding area for sheath removal and pressure hemostasis in stable condition.  RESULTS:  PRESSURES:  LV:  130/0; LVEDP 14 mmHg.  CA:  130/75 mmHg.  There was no gradient across the aortic valve on catheter pullback.  Fluoroscopy did not show  any coronary, intracardiac, or valvular calcification.  The main left coronary was normal.  The left anterior descending artery was widely patent and normal throughout its course.  It was tortuous and coursed to the apex of the heart, where it bifurcated.  There were two small diagonal branches after SP-2 in the junction of the proximal third and the mid-third that were normal.  The circumflex gave off a large first marginal branch very proximally that was widely patent, tortuous, and normal.  The remainder of the circumflex was of moderate size and nondominant, with a very small OM-2 and OM-3 branches, which were normal, normal PAVG and left atrial branch.  The right coronary artery was a dominant and large vessel.  There was very minimal luminal irregularity in the proximal third with no significant narrowing (less than 10%) with very large residual lumen.  The remainder of the vessel was widely patent and smooth throughout its course with a normal large PDA and PLA and normal large RV branch from the midportion and before the acute margin.  Abdominal aortic angiogram in the midstream PA projection revealed widely patent, normal single renal arteries bilaterally and normal infrarenal abdominal aorta.  The SMA and celiac axis proximally appeared normal.  LV angiogram in the RAO and LAO projection showed a normally-contracting ventricle with sinus beats.  There were no segmental wall motion abnormalities.  Estimated EF was approximately 60%.  There was angiographic LVH present of at least a mild degree.  There was no mitral regurgitation  present.  DISCUSSION:  This very pleasant 45 year old married nonsmoker, mother of two has a history of hypertension, hyperlipidemia, severe exogenous obesity.  She was admitted with hypokalemia that has been treated on diuretic therapy. Admission symptoms included substernal chest pain radiating to the interscapular area, lasting three to four  minutes at a time, over the last week.  She had a nocturnal episode in the hospital that was relieved with nitroglycerin administration.  She has also had some atypical left upper chest pain with radiation to the left shoulder area.  There is a remote history of hiatal hernia on endoscopy in the early 1990s, but these symptoms were similar to that.  Myocardial infarction was ruled out by serial enzymes.  EKG showed lateral T-wave inversion and nonspecific inferolateral ST changes. Catheterization was prompted by symptoms, EKG abnormality, risk factors as outlined above.  Fortunately, the patient has normal coronary arteries and left ventricle and renal arteries with no evidence of FMD.  Recommend reassurance, continued medical therapy of her systemic hypertension, and assessment of her lipid status.  Empiric GI therapy and further follow-up as per Dr. Renae Gloss.  CATHETERIZATION DIAGNOSES: 1. Chest pain, etiology not determined. 2. Essentially normal coronary arteries and left ventricle. 3. Systemic hypertension with probable left ventricular hypertrophy. 4. Hypokalemia on diuretic therapy, treated. 5. History of hiatal hernia. 6. Possible gastroesophageal reflux disease, esophageal spasm as the etiology    of her chest symptoms. Dictated by:   Pearletha Furl Alanda Amass, M.D. Attending Physician:  Andi Devon R. DD:  07/25/01 TD:  07/26/01 Job: [id number]
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
## 2011-01-22 ENCOUNTER — Ambulatory Visit: Payer: 59 | Admitting: Family Medicine

## 2011-04-02 ENCOUNTER — Encounter: Payer: Self-pay | Admitting: Internal Medicine

## 2011-04-02 ENCOUNTER — Ambulatory Visit (INDEPENDENT_AMBULATORY_CARE_PROVIDER_SITE_OTHER): Payer: 59 | Admitting: Internal Medicine

## 2011-04-02 VITALS — BP 120/70 | HR 58 | Temp 97.7°F | Wt 313.6 lb

## 2011-04-02 DIAGNOSIS — M549 Dorsalgia, unspecified: Secondary | ICD-10-CM

## 2011-04-02 NOTE — Patient Instructions (Signed)
You were seen today for trigger point injections in your shoulder and back. Avoid heavy lifting today. If possible, ice the areas today. You may get return of pain or stiffness after the lidocaine wears off in 5 hours. If you need to return please feel free to call our office. Our number is [phone].  Trigger Point Therapy Your exam shows your pain is coming from one or more trigger points. These points are places where the muscles of the neck, shoulder, back, and legs are tender to touch and cause pain, spasm, and fatigue. This condition can cause headaches, painful neck spasms, and low back pain. Trigger points can be thought of as weak areas of muscle that spasm and form hard knots when under chronic strain or stress. Treatment of trigger points may include medicines to reduce pain and inflammation and relax the muscles. Physical therapy can include several techniques. Deep friction massage over the tender areas can often help relieve pain and stiffness. Relaxation exercises and stretching may be helpful; cervical and lumbar traction can be used in severe cases. Cold therapy and hot packs have both proven helpful. Injection therapy with saline, anesthetics, or cortisone medicines, has also been very successful at reducing symptoms. Two or more injections are often needed over several weeks to gain the greatest benefit. Call your doctor to arrange for further treatment as recommended. Document Released: 09/02/2004 Document Re-Released: 10/20/2009 [facility] Patient Information 2011 [place], [place].
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
## 2011-10-20 ENCOUNTER — Other Ambulatory Visit (HOSPITAL_COMMUNITY): Payer: Self-pay | Admitting: Obstetrics & Gynecology

## 2011-10-20 DIAGNOSIS — Z1231 Encounter for screening mammogram for malignant neoplasm of breast: Secondary | ICD-10-CM

## 2011-11-06 ENCOUNTER — Encounter: Payer: Self-pay | Admitting: Emergency Medicine

## 2011-11-06 ENCOUNTER — Emergency Department: Admit: 2011-11-06 | Discharge: 2011-11-06 | Disposition: A | Discharge status: Home / Self Care | Payer: 59

## 2011-11-06 ENCOUNTER — Emergency Department
Admission: EM | Admit: 2011-11-06 | Discharge: 2011-11-06 | Disposition: A | Discharge status: Home / Self Care | Payer: 59 | Source: Home / Self Care

## 2011-11-06 DIAGNOSIS — S93409A Sprain of unspecified ligament of unspecified ankle, initial encounter: Secondary | ICD-10-CM

## 2011-11-06 DIAGNOSIS — S93401A Sprain of unspecified ligament of right ankle, initial encounter: Secondary | ICD-10-CM

## 2011-11-06 DIAGNOSIS — M25571 Pain in right ankle and joints of right foot: Secondary | ICD-10-CM

## 2011-11-06 MED ORDER — DICLOFENAC POTASSIUM 50 MG PO TABS: 50.0000 mg | ORAL_TABLET | Freq: Three times a day (TID) | ORAL | Status: DC

## 2011-11-06 NOTE — ED Provider Notes (Signed)
History     CSN: [id number]  Arrival date & time 11/06/11  1048   None     Chief Complaint  Patient presents with  . Ankle Pain    (Consider location/radiation/quality/duration/timing/severity/associated sxs/prior treatment) HPI Pt reports right ankle pain after falling coming down steps four days ago, impact to bilateral knees but during fall her right ankle twisted inward and back. Ace wrap applied to ankle within two hours and she began to take Ibuprofen 800mg  TID for pain.  Some relief noted but continues to have pain. Pain is worse with driving and walking, does not walk with limp.  Denies hx of bone diseases or previous ankle injuries.    Past Medical History  Diagnosis Date  . HTN (hypertension)   . Hyperlipidemia   . Depression   . Asthma     no intubation or hospitalization hx  . GERD (gastroesophageal reflux disease)   . Diabetes mellitus     Past Surgical History  Procedure Date  . Vesicovaginal fistula closure w/ tah     nov 07  . Rotator cuff repair     R 2000  . Ganglion cyst excision     R wrist 2000  . Marsupialization urethral diverticulum     2012  . Abdominal hysterectomy     Family History  Problem Relation Age of Onset  . Hyperlipidemia Mother   . Hypertension Mother   . Kidney disease Mother   . Cancer Father     prostate    History  Substance Use Topics  . Smoking status: Never Smoker   . Smokeless tobacco: Not on file  . Alcohol Use: Not on file    OB History    Grav Para Term Preterm Abortions TAB SAB Ect Mult Living                  Review of Systems  All other systems reviewed and are negative.    Allergies  Codeine and Morphine and related  Home Medications   Current Outpatient Rx  Name Route Sig Dispense Refill  . BUPROPION HCL ER (XL) 150 MG PO TB24 Oral Take 150 mg by mouth daily.      Marland Kitchen VITAMIN D3 2000 UNITS PO TABS Oral Take by mouth.      . DICLOFENAC POTASSIUM 50 MG PO TABS Oral Take 1 tablet (50 mg  total) by mouth 3 (three) times daily. As needed for pain 1 tablet 0  . ESCITALOPRAM OXALATE 10 MG PO TABS Oral Take 10 mg by mouth daily.      Marland Kitchen ESOMEPRAZOLE MAGNESIUM 40 MG PO CPDR Oral Take 40 mg by mouth daily before breakfast.      . OMEGA-3 FATTY ACIDS 1000 MG PO CAPS Oral Take 2 g by mouth daily.      Marland Kitchen METFORMIN HCL ER (MOD) 500 MG PO TB24 Oral Take 500 mg by mouth daily with breakfast.      . OLMESARTAN MEDOXOMIL-HCTZ 40-25 MG PO TABS Oral Take 1 tablet by mouth daily.      Marland Kitchen VITAMIN B-12 100 MCG PO TABS Oral Take 50 mcg by mouth daily. Extended release OTC       BP 98/63  Pulse 92  Temp(Src) 98.6 F (37 C) (Oral)  Resp 18  Ht 5' 6.5" (1.689 m)  Wt 311 lb (141.069 kg)  BMI 49.44 kg/m2  SpO2 98%  Physical Exam  Constitutional: She is oriented to person, place, and time. Vital signs are normal.  She appears well-developed and well-nourished. She is active and cooperative.  HENT:  Head: Normocephalic.  Eyes: Conjunctivae are normal. Pupils are equal, round, and reactive to light. No scleral icterus.  Neck: Trachea normal. Neck supple.  Cardiovascular: Normal rate and regular rhythm.   Pulmonary/Chest: Effort normal and breath sounds normal.  Musculoskeletal: Normal range of motion.       Right ankle: She exhibits swelling. She exhibits normal range of motion, no ecchymosis and no deformity. tenderness. Lateral malleolus and medial malleolus tenderness found. Achilles tendon normal.       Left ankle: Normal.  Neurological: She is alert and oriented to person, place, and time. She has normal reflexes. No cranial nerve deficit or sensory deficit.  Skin: Skin is warm and dry.  Psychiatric: She has a normal mood and affect. Her speech is normal and behavior is normal. Judgment and thought content normal. Cognition and memory are normal.    ED Course  Procedures (including critical care time)  Labs Reviewed - No data to display Dg Ankle Complete Right  11/06/2011  *RADIOLOGY  REPORT*  Clinical Data: Lateral pain after fall  RIGHT ANKLE - COMPLETE 3+ VIEW  Comparison: July 26, 2003  Findings: There is lateral soft tissue swelling; however, there is no evidence of fracture or dislocation.  The ankle mortise is intact.  Posterior and plantar calcaneal spurs are present.  IMPRESSION: There is no evidence of fracture or dislocation.  Original Report Authenticated By: Brandon Melnick, M.D.     1. Ankle pain, right   2. Right ankle sprain       MDM  No ankle fracture found on xray.  Begin Cataflam twice daily, may increase to three times a day for the next two weeks.  Apply ice pack for 30 minutes every 1 to 2 hours today and tomorrow.  Elevate as much as possible.  Wear ankle brace for about 2 to 3 weeks.  Begin range of motion and stretching exercises in about 5 days.  If symptoms not improved follow up with orthopedist.        Johnsie Kindred, NP 11/06/11 1221

## 2011-11-06 NOTE — Discharge Instructions (Signed)
No ankle fracture found on xray.  Begin Cataflam twice daily, may increase to three times a day for the next two weeks.  Apply ice pack for 30 minutes every 1 to 2 hours today and tomorrow.  Elevate as much as possible.  Wear ankle brace for about 2 to 3 weeks.  Begin range of motion and stretching exercises in about 5 days.  If symptoms not improved follow up with orthopedist.  Ankle Sprain An ankle sprain is an injury to the strong, fibrous tissues (ligaments) that hold the bones of your ankle joint together.  CAUSES Ankle sprain usually is caused by a fall or by twisting your ankle. People who participate in sports are more prone to these types of injuries.  SYMPTOMS  Symptoms of ankle sprain include:  Pain in your ankle. The pain may be present at rest or only when you are trying to stand or walk.   Swelling.   Bruising. Bruising may develop immediately or within 1 to 2 days after your injury.   Difficulty standing or walking.  DIAGNOSIS  Your caregiver will ask you details about your injury and perform a physical exam of your ankle to determine if you have an ankle sprain. During the physical exam, your caregiver will press and squeeze specific areas of your foot and ankle. Your caregiver will try to move your ankle in certain ways. An X-ray exam may be done to be sure a bone was not broken or a ligament did not separate from one of the bones in your ankle (avulsion).  TREATMENT  Certain types of braces can help stabilize your ankle. Your caregiver can make a recommendation for this. Your caregiver may recommend the use of medication for pain. If your sprain is severe, your caregiver may refer you to a surgeon who helps to restore function to parts of your skeletal system (orthopedist) or a physical therapist. HOME CARE INSTRUCTIONS  Apply ice to your injury for 1 to 2 days or as directed by your caregiver. Applying ice helps to reduce inflammation and pain.  Put ice in a plastic bag.    Place a towel between your skin and the bag.   Leave the ice on for 15 to 20 minutes at a time, every 2 hours while you are awake.   Take over-the-counter or prescription medicines for pain, discomfort, or fever only as directed by your caregiver.   Keep your injured leg elevated, when possible, to lessen swelling.   If your caregiver recommends crutches, use them as instructed. Gradually, put weight on the affected ankle. Continue to use crutches or a cane until you can walk without feeling pain in your ankle.   If you have a plaster splint, wear the splint as directed by your caregiver. Do not rest it on anything harder than a pillow the first 24 hours. Do not put weight on it. Do not get it wet. You may take it off to take a shower or bath.   You may have been given an elastic bandage to wear around your ankle to provide support. If the elastic bandage is too tight (you have numbness or tingling in your foot or your foot becomes cold and blue), adjust the bandage to make it comfortable.   If you have an air splint, you may blow more air into it or let air out to make it more comfortable. You may take your splint off at night and before taking a shower or bath.   Wiggle  your toes in the splint several times per day if you are able.  SEEK MEDICAL CARE IF:   You have an increase in bruising, swelling, or pain.   Your toes feel cold.   Pain relief is not achieved with medication.  SEEK IMMEDIATE MEDICAL CARE IF: Your toes are numb or blue or you have severe pain. MAKE SURE YOU:   Understand these instructions.   Will watch your condition.   Will get help right away if you are not doing well or get worse.  Document Released: 07/26/2005 Document Revised: 07/15/2011 Document Reviewed: 02/28/2008 [facility] Patient Information 2012 [place], [place].

## 2011-11-06 NOTE — ED Notes (Signed)
Fell on 2 steps 3 days ago and rolled right ankle.

## 2011-11-08 NOTE — ED Provider Notes (Signed)
Agree with exam, assessment, and plan.    Lattie Haw, MD 11/08/11 [phone]

## 2011-11-19 ENCOUNTER — Encounter: Payer: Self-pay | Admitting: Family Medicine

## 2011-11-19 ENCOUNTER — Ambulatory Visit (INDEPENDENT_AMBULATORY_CARE_PROVIDER_SITE_OTHER): Payer: 59 | Admitting: Family Medicine

## 2011-11-19 VITALS — BP 121/78 | HR 101 | Temp 98.2°F | Ht 67.0 in | Wt 311.0 lb

## 2011-11-19 DIAGNOSIS — S93409A Sprain of unspecified ligament of unspecified ankle, initial encounter: Secondary | ICD-10-CM

## 2011-11-19 DIAGNOSIS — S93401A Sprain of unspecified ligament of right ankle, initial encounter: Secondary | ICD-10-CM

## 2011-11-19 NOTE — Patient Instructions (Signed)
You have an ankle sprain. Ice the area for 15 minutes at a time, 3-4 times a day Take diclofenac twice a day with food as you have been until pain has resolved. Elevate above the level of your heart when possible Cane if needed to help with walking Bear weight when tolerated Wear laceup ankle brace to help with stability while you recover from this injury - only wear when up and walking around. Come out of the brace twice a day to do Up/down and alphabet exercises 2-3 sets of each - add strengthening with theraband 3 sets of 10 (can start 3 sets of 6) once a day each direction. Consider physical therapy for strengthening and balance exercises. If not improving as expected, we may repeat x-rays or consider further testing like an MRI. Follow up with me in 4 weeks for reevaluation.

## 2011-11-21 ENCOUNTER — Encounter: Payer: Self-pay | Admitting: Family Medicine

## 2011-11-21 DIAGNOSIS — S93401A Sprain of unspecified ligament of right ankle, initial encounter: Secondary | ICD-10-CM | POA: Insufficient documentation

## 2011-11-21 NOTE — Assessment & Plan Note (Signed)
likely Grade 2.  Given different ASO that felt more comfortable to her and is the right size.  Continue diclofenac.  Discussed icing, elevation.  Cane to help with ambulation.  Start ROM exercises - shown strengthening exercises as well to do once daily.  F/u in 4 weeks for recheck.  See instructions for further.

## 2011-11-21 NOTE — Progress Notes (Signed)
Subjective:    Patient ID: Denise Stevens, female    DOB: [birth year], 46 y.o.   MRN: [id number]  PCP: Genella Mech  HPI 46 yo F here for right ankle injury.  Patient reports she was coming down stairs on 3/27. She missed on the last two and inverted/hyperdorsiflexed right ankle. DIfficulty bearing weight right after this. Has improved since then. Had gone to ED on 3/30 and had x-rays negative for fracture. Has been using ASO but this is too large. + swelling and did have some bruising but none now. No prior ankle injuries.  Past Medical History  Diagnosis Date  . HTN (hypertension)   . Hyperlipidemia   . Depression   . Asthma     no intubation or hospitalization hx  . GERD (gastroesophageal reflux disease)   . Diabetes mellitus     Current Outpatient Prescriptions on File Prior to Visit  Medication Sig Dispense Refill  . buPROPion (WELLBUTRIN XL) 150 MG 24 hr tablet Take 150 mg by mouth daily.        . Cholecalciferol (VITAMIN D3) 2000 UNITS TABS Take by mouth.        . diclofenac (CATAFLAM) 50 MG tablet Take 1 tablet (50 mg total) by mouth 3 (three) times daily. As needed for pain  1 tablet  0  . escitalopram (LEXAPRO) 10 MG tablet Take 10 mg by mouth daily.        Marland Kitchen esomeprazole (NEXIUM) 40 MG capsule Take 40 mg by mouth daily before breakfast.        . fish oil-omega-3 fatty acids 1000 MG capsule Take 2 g by mouth daily.        . metFORMIN (GLUMETZA) 500 MG (MOD) 24 hr tablet Take 500 mg by mouth daily with breakfast.        . olmesartan-hydrochlorothiazide (BENICAR HCT) 40-25 MG per tablet Take 1 tablet by mouth daily.        . vitamin B-12 (CYANOCOBALAMIN) 100 MCG tablet Take 50 mcg by mouth daily. Extended release OTC         Past Surgical History  Procedure Date  . Vesicovaginal fistula closure w/ tah     nov 07  . Rotator cuff repair     R 2000  . Ganglion cyst excision     R wrist 2000  . Marsupialization urethral diverticulum     2012  . Abdominal  hysterectomy     Allergies  Allergen Reactions  . Codeine Itching  . Morphine And Related Itching    History   Social History  . Marital Status: Married    Spouse Name: N/A    Number of Children: N/A  . Years of Education: N/A   Occupational History  . Not on file.   Social History Main Topics  . Smoking status: Never Smoker   . Smokeless tobacco: Not on file  . Alcohol Use: Not on file  . Drug Use: Not on file  . Sexually Active: Not on file   Other Topics Concern  . Not on file   Social History Narrative  . No narrative on file    Family History  Problem Relation Age of Onset  . Hyperlipidemia Mother   . Hypertension Mother   . Kidney disease Mother   . Diabetes Mother   . Cancer Father     prostate  . Heart attack Neg Hx     BP 121/78  Pulse 101  Temp(Src) 98.2 F (36.8 C) (Oral)  Ht 5\' 7"  (1.702 m)  Wt 311 lb (141.069 kg)  BMI 48.71 kg/m2  Review of Systems See HPI above.    Objective:   Physical Exam Gen:NAD  R ankle: Mild lateral ankle swelling.  No bruising or other deformity. Mild limitation motion all directions - able to resist all motions. TTP over ATFL.  No medial/lateral malleoli, base 5th, navicular, fibular head, other ankle TTP. 2+ talar tilt, 1+ ant drawer. Negative syndesmotic compression. Thompsons test negative. NV intact distally.    Assessment & Plan:  1. Right ankle sprain - likely Grade 2.  Given different ASO that felt more comfortable to her and is the right size.  Continue diclofenac.  Discussed icing, elevation.  Cane to help with ambulation.  Start ROM exercises - shown strengthening exercises as well to do once daily.  F/u in 4 weeks for recheck.  See instructions for further.

## 2011-11-25 ENCOUNTER — Ambulatory Visit (HOSPITAL_COMMUNITY)
Admission: RE | Admit: 2011-11-25 | Discharge: 2011-11-25 | Disposition: A | Discharge status: Home / Self Care | Payer: 59 | Source: Ambulatory Visit | Attending: Obstetrics & Gynecology | Admitting: Obstetrics & Gynecology

## 2011-11-25 DIAGNOSIS — Z1231 Encounter for screening mammogram for malignant neoplasm of breast: Secondary | ICD-10-CM | POA: Insufficient documentation

## 2011-12-17 ENCOUNTER — Ambulatory Visit: Payer: 59 | Admitting: Family Medicine

## 2012-03-13 ENCOUNTER — Ambulatory Visit (INDEPENDENT_AMBULATORY_CARE_PROVIDER_SITE_OTHER): Payer: 59 | Admitting: Internal Medicine

## 2012-03-13 VITALS — BP 124/77 | HR 96 | Temp 97.5°F | Resp 20 | Ht 66.5 in | Wt 310.1 lb

## 2012-03-13 DIAGNOSIS — R51 Headache: Secondary | ICD-10-CM

## 2012-03-13 MED ORDER — KETOROLAC TROMETHAMINE 30 MG/ML IM SOLN: 30.0000 mg | Freq: Once | INTRAMUSCULAR | Status: DC

## 2012-03-13 MED ORDER — KETOROLAC TROMETHAMINE 30 MG/ML IJ SOLN: 30.0000 mg | Freq: Once | INTRAMUSCULAR | Status: DC

## 2012-03-13 MED ORDER — KETOROLAC TROMETHAMINE 30 MG/ML IJ SOLN
30.0000 mg | Freq: Once | INTRAMUSCULAR | Status: AC
  Administered 2012-03-13: 30 mg via INTRAMUSCULAR

## 2012-03-13 NOTE — Patient Instructions (Signed)
You were seen today for a headache. We think this is coming from muscle stress and strain. We have given you an injection (shot) of toradol which will help to reduce inflammation and irritation of the muscles in your neck and head. This shot will take 30-60 minutes to take affect and should break the headache. If possible, please avoid tylenol and ibuprofen for headache in the next several days as intake of these medicines can cause headaches to come when you stop taking these medicines giving you another headache. If this headache should come back or worsen please call our office for further instructions. Our number is [phone]. We will see you back as needed.

## 2012-03-13 NOTE — Progress Notes (Signed)
Subjective:     Patient ID: Denise Stevens, female   DOB: 1965/12/23, 46 y.o.   MRN: [id number]  HPI The patient is a 46 year-old female who comes in today for a headache. This headache has been going on off and on for 2 weeks. She states that she has been using ibuprofen and Tylenol quite regularly during this time period. She states that she did use to get some migraine headaches before hysterectomy that were alleviated by Toradol when they got especially severe. She does have some new stressors in her life however they do not appear to be especially severe. She's not had any recent trauma or falls. She does not have any blurry vision, shortness of breath, chest pain, fevers, chills, weight loss, weight gain. She has been taking her medications as prescribed. Her blood pressure is not elevated at today's visit. She is not having light sensitivity however she is having sensitivity to sound. She was feeling slightly nauseous earlier however she's not thrown up. The headache did get acutely worse this morning around 3 to 4 AM.  the pain does start in the back of her neck/upper shoulders and does radiate into the front of her head.   Review of Systems  Constitutional: Negative for fever, chills, diaphoresis, activity change, appetite change, fatigue and unexpected weight change.  HENT: Positive for neck pain. Negative for hearing loss, ear pain, nosebleeds, congestion, sore throat, facial swelling, rhinorrhea, sneezing, drooling, mouth sores, trouble swallowing, neck stiffness, dental problem, voice change, postnasal drip, sinus pressure, tinnitus and ear discharge.   Respiratory: Negative.   Cardiovascular: Negative.   Gastrointestinal: Positive for nausea. Negative for vomiting, abdominal pain, constipation and abdominal distention.  Skin: Negative.   Neurological: Positive for headaches. Negative for dizziness, tremors, seizures, syncope, facial asymmetry, speech difficulty, weakness, light-headedness  and numbness.  Hematological: Negative.   Psychiatric/Behavioral: Negative.        Objective:   Physical Exam  Constitutional: She is oriented to person, place, and time. She appears well-developed and well-nourished.  HENT:  Head: Normocephalic and atraumatic.  Eyes: Conjunctivae and EOM are normal. Pupils are equal, round, and reactive to light. Right eye exhibits no discharge. Left eye exhibits no discharge. No scleral icterus.  Neck: Normal range of motion. Neck supple. No JVD present. No tracheal deviation present. No thyromegaly present.       Some tenderness and muscular spasm in the neck area.   Cardiovascular: Normal rate and regular rhythm.   Pulmonary/Chest: Effort normal and breath sounds normal. No stridor.  Abdominal: Soft. Bowel sounds are normal. She exhibits no distension and no mass. There is no tenderness. There is no rebound and no guarding.  Musculoskeletal: She exhibits tenderness.       In the neck.  Lymphadenopathy:    She has no cervical adenopathy.  Neurological: She is alert and oriented to person, place, and time.  Skin: Skin is warm and dry.  Psychiatric: She has a normal mood and affect. Her behavior is normal. Judgment and thought content normal.       Assessment/Plan:   1. Headache-patient does seem to have a stress headache at this time. Other differential include migraine versus rebound headache. No worrisome signs symptoms or recent head trauma. Will try a shot of Toradol to see if this does improve the pain and breakthrough the headache cycle. Advised that if this does take away the pain she should avoid taking ibuprofen or Tylenol next several days to avoid rebound headaches. Advised  her that she should work on loosing of her neck and shoulder muscles. Advised massage if able or water exercises. She does have access to a pool.  2. Disposition-patient be seen back as needed if this pain worsens or does not alleviate.

## 2012-03-13 NOTE — Progress Notes (Signed)
Pt sent message to physician:I feel like a new woman.  Thank You so much!  I FEEL GREAT!Mon 03/13/2012 12:26 PM - Pain level decreased but pt not available in clinic to assign numeric relief. Trixie Dredge, late entry 03/13/2012, [phone]

## 2012-03-13 NOTE — Addendum Note (Signed)
Addended by: Dorie Rank E on: 03/13/2012 12:04 PM   Modules accepted: Orders

## 2012-07-28 ENCOUNTER — Ambulatory Visit: Payer: 59

## 2012-08-04 ENCOUNTER — Ambulatory Visit: Payer: 59

## 2012-08-14 ENCOUNTER — Ambulatory Visit: Payer: 59

## 2012-09-15 ENCOUNTER — Ambulatory Visit (INDEPENDENT_AMBULATORY_CARE_PROVIDER_SITE_OTHER): Payer: Self-pay | Admitting: Family Medicine

## 2012-09-15 DIAGNOSIS — E119 Type 2 diabetes mellitus without complications: Secondary | ICD-10-CM

## 2012-09-15 NOTE — Progress Notes (Signed)
Patient presents today for 3 month DM follow-up as part of the employer-sponsored Link to Wellness program. Medications and glucose readings have been reviewed. I have also discussed with patient lifestyle interventions such as healthy eating choices and physical activity. Details of this visit can be found in Care Tracker documenting program available through [facility] ([facility]). Patient has set a series of personal goals and will follow-up in 3 months for further review of DM.  

## 2012-09-23 ENCOUNTER — Other Ambulatory Visit: Payer: Self-pay

## 2012-09-25 ENCOUNTER — Encounter: Payer: Self-pay | Admitting: Family Medicine

## 2012-09-25 NOTE — Progress Notes (Signed)
Patient ID: Denise Stevens, female   DOB: 03-29-1966, 47 y.o.   MRN: [id number] Reviewed: Agree with the documentation and management.

## 2012-12-08 ENCOUNTER — Other Ambulatory Visit: Payer: Self-pay

## 2012-12-08 DIAGNOSIS — Z1231 Encounter for screening mammogram for malignant neoplasm of breast: Secondary | ICD-10-CM

## 2013-01-05 ENCOUNTER — Ambulatory Visit (INDEPENDENT_AMBULATORY_CARE_PROVIDER_SITE_OTHER): Payer: Self-pay | Admitting: Family Medicine

## 2013-01-05 VITALS — BP 137/90 | HR 81 | Ht 66.0 in | Wt 308.8 lb

## 2013-01-05 DIAGNOSIS — E1169 Type 2 diabetes mellitus with other specified complication: Secondary | ICD-10-CM | POA: Insufficient documentation

## 2013-01-05 DIAGNOSIS — E119 Type 2 diabetes mellitus without complications: Secondary | ICD-10-CM

## 2013-01-05 NOTE — Progress Notes (Signed)
Established Patient - Follow-Up Evaluation  Link To Wellness Class - Clinical Pharmacist  Care Team Member: Gerre Pebbles    Subjective:  Patient presents today for 3 month diabetes follow-up as part of the employer-sponsored Link to Wellness program. Current diabetes regimen includes metformin 250 mg once daily (although it is prescribed as 500 mg once daily). Patient also continues on daily ARB and statin. Most recent MD follow-up was in April with Dr. Renae Gloss. No med changes or major health changes at this time.   Other Diabetes History: She has not been checking her blood sugar. She has checked it a few times since her last appointment.   She has been taking metformin on a more frequent basis.    Social History:  Exercise habits: minimal; Denies alcohol use; Denies drug use; Diet adherence 50-75% of the time; Exercise adherence Never; Patient cannot afford medications; Patient knows the purpose/use of medications; Pharmacy assist consult was not needed; Pharmacist consult was done; Caffeine use: soda 1 per day [facility]; 0 minutes of exercise per week; Medication adherence adherent.  Home Environment: SafeRacial background: African American  Occupation: Diplomatic Services operational officer   Physical Activity- Since her last visit she is walking 4 days a week for 15 minutes (started April 21st). She has lost 4 pounds since her last appointment with me and she reports that she is sleeping better. She went to zumba for one class and hurt her hip.   Nutrition- typical day  B- 2 pieces wheat toast, 3 slices bacon, Diet Mountain Dew, boiled egg  L- grilled chicken breast, salad, green beans. She has not been eating the rolls. She is still drinking sweet tea.   afternoon snack- grapes and cheese cup from the cafeteria; or the pepperoni crackers and cheese.   D- this past week she reports that it hasn't been as good. She had pizza every day (thin crust), 2 slices, water, no vegetables.   Testing:   Blood Sugar Tests:  Hemoglobin A1c: 7.1       Assessment/Plan:  Patient is a 47 year old female with DM2 who is currently slightly above A1C goal with an A1C of 7.1% at her last visit with Dr. Renae Gloss (this is increased from 6.6% at her last visit with me). Patient presents today in good spirits. She has lost 4 pounds since her last appointment with me. I congratulated patient on her success so far and encouraged her to continue making healthy changes. She has started walking daily at work for about 15 minutes with a Radio broadcast assistant. This is a big change as she was completing zero physical activity previously. She has been walking for over a month so I am hoping she is developing walking as a habit. She states that she would like to do water aerobics again but cannot afford a membership to the [employer] at the moment. She has a pool in her apartment complex but hasn't gone to use it yet. She is anxious to get in the pool and be seen in a bathing suit. I encouraged to use the pool during the week when it isn't as busy.   Eating habits have also improved. She is only eating 1 brownie at lunch and is sending the rest of the brownies to the internal medicine residents. She is eating more salads and vegetables at lunch. The rest of the day, however, she is hardly getting any fruits or vegetables. She lives alone and states that she doesn't like to cook just for herself. I  encouraged her to add raw vegetables with dinner or for snacks and to use frozen vegetables in a microwavable steam bag with dinner. She is still drinking sweet tea. We reviewed nutritional information for sweet tea and discussed calorie balance for weight loss. She agreed to cut back sweet tea consumption to once a week.   Patient recently had a physical exam with Dr. Renae Gloss. At that visit Dr. Renae Gloss discussed with her starting Bydureon, but we never received a prescription at the pharmacy to start the medication. I called Dr. Mathews Robinsons office today  but was only able to leave a message with the triage nurse. I will follow up with their office to see if we can get a prescription called over to the pharmacy.   She is not currently checking her blood sugar. She states that she will try to start checking once a week. Patient has started to be more consistent with taking her medication on a daily basis. She has restarted metformin and is taking 250 mg once daily at dinner. She states that she wants to increase to 1 whole tablet with dinner (how it is prescribed), but is worried about side effects as she has had N/V issues in the past when she has taken it.   Follow up with patient in 3 months..    Goals for Next Visit-  1. Eat one serving of vegetables with dinner. Ideas- microwave steam vegetable bags, cucumbers and carrots, or broccoli.  2. Limit sweet tea to once a week. Get the crystal light or mio flavoring.  3. Continue physical activity. To increase physical activity either add a lap of walking at work OR start using the pool for water jogging/water aerobics.   Next appointment with me is Friday August 29th at 8:30 AM.

## 2013-01-11 ENCOUNTER — Encounter: Payer: Self-pay | Admitting: Internal Medicine

## 2013-01-11 ENCOUNTER — Ambulatory Visit (INDEPENDENT_AMBULATORY_CARE_PROVIDER_SITE_OTHER): Payer: 59 | Admitting: Internal Medicine

## 2013-01-11 VITALS — BP 132/85 | HR 99 | Temp 98.7°F | Ht 66.0 in | Wt 315.8 lb

## 2013-01-11 DIAGNOSIS — R52 Pain, unspecified: Secondary | ICD-10-CM

## 2013-01-11 DIAGNOSIS — R1031 Right lower quadrant pain: Secondary | ICD-10-CM

## 2013-01-11 LAB — COMPLETE METABOLIC PANEL WITH GFR
ALT: 11 U/L (ref 0–35)
AST: 11 U/L (ref 0–37)
Albumin: 3.4 g/dL — ABNORMAL LOW (ref 3.5–5.2)
BUN: 7 mg/dL (ref 6–23)
Calcium: 8.9 mg/dL (ref 8.4–10.5)
Chloride: 104 mEq/L (ref 96–112)
Potassium: 3.8 mEq/L (ref 3.5–5.3)
Total Protein: 6.7 g/dL (ref 6.0–8.3)

## 2013-01-11 NOTE — Progress Notes (Signed)
Patient ID: Denise Stevens, female   DOB: Sep 06, 1965, 47 y.o.   MRN: [id number]  Subjective:   Patient ID: Denise Stevens female   DOB: October 02, 1965 47 y.o.   MRN: [id number]  HPI: Denise Stevens is a 47 y.o. female with history of HTN, HLD, DM presenting to the clinic for acute visit for R side pain. She follows at [facility] for her primary care.  Patient presents for evaluation of right sided abdominal pain. She noticed discomfort after eating the [employer] meal and also eating blueberries or free. Over the past couple days, she's had intermittent right mid to lower abdominal pain. She describes it as dull in nature and 6/10 on pain scale. Pain is improved with lying on her right side. She says she's been a little bit constipated over the past few days, with stools hard than usual. She also feels a little bit bloated and has been belching more than usual. Prior to coming in for her appointment just now, she had a loose bowel movement. She's not had any blood in her stool. She cannot identify any aggravating factors of pain. She denies any recent vigorous exercise, muscle strain, or injury. She's not had any fever, chills, nausea, or vomiting. She has not had any dysuria, frequency, urgency, hematuria, or pain radiating to back. She denies any vaginal discharge or bleeding. She is status post partial hysterectomy. She is concerned that pain may be related to restarting her statin recently.  Past Medical History  Diagnosis Date  . HTN (hypertension)   . Hyperlipidemia   . Depression   . Asthma     no intubation or hospitalization hx  . GERD (gastroesophageal reflux disease)   . Diabetes mellitus    Current Outpatient Prescriptions  Medication Sig Dispense Refill  . buPROPion (WELLBUTRIN XL) 150 MG 24 hr tablet Take 150 mg by mouth daily.        . diclofenac sodium (VOLTAREN) 1 % GEL Apply 2 g topically 4 (four) times daily.      Marland Kitchen escitalopram (LEXAPRO) 10 MG tablet Take 10 mg  by mouth daily.        Marland Kitchen esomeprazole (NEXIUM) 40 MG capsule Take 40 mg by mouth daily before breakfast.      . metFORMIN (GLUMETZA) 500 MG (MOD) 24 hr tablet Take 250 mg by mouth daily before supper.       . pravastatin (PRAVACHOL) 40 MG tablet Take 40 mg by mouth daily.      . valsartan-hydrochlorothiazide (DIOVAN-HCT) 160-25 MG per tablet Take 1 tablet by mouth daily.       No current facility-administered medications for this visit.   Family History  Problem Relation Age of Onset  . Hyperlipidemia Mother   . Hypertension Mother   . Kidney disease Mother   . Diabetes Mother   . Cancer Father     prostate  . Heart attack Neg Hx    History   Social History  . Marital Status: Married    Spouse Name: N/A    Number of Children: N/A  . Years of Education: N/A   Social History Main Topics  . Smoking status: Never Smoker   . Smokeless tobacco: None  . Alcohol Use: Yes     Comment: wine  . Drug Use: No  . Sexually Active: None   Other Topics Concern  . None   Social History Narrative  . None   Review of Systems: 10 pt ROS performed, pertinent positives  and negatives noted in HPI Objective:  Physical Exam: Filed Vitals:   01/11/13 1412  BP: 132/85  Pulse: 99  Temp: 98.7 F (37.1 C)  TempSrc: Oral  Height: 5\' 6"  (1.676 m)  Weight: 315 lb 12.8 oz (143.246 kg)  SpO2: 96%   Vitals reviewed. General: pleasant woman sitting in chair, NAD HEENT: PERRL, EOMI, no scleral icterus Cardiac: RRR, no rubs, murmurs or gallops Pulm: clear to auscultation bilaterally, no wheezes, rales, or rhonchi Abd: soft w normoactive BS. Tenderness w deep palpation of R lower and upper quadrants that radiates to mid abdomen. No rebound or guarding. No CVA or suprapubic tenderness Ext: warm and well perfused Neuro: alert and oriented X3, cranial nerves II-XII grossly intact, strength and sensation to light touch equal in bilateral upper and lower extremities  Assessment & Plan:   Please  see problem-based charting for assessment and plan.

## 2013-01-11 NOTE — Assessment & Plan Note (Signed)
Has R sided abdominal pain primarily in lower quadrant but also w some pain in upper quadrant on palpation. Increased bloating/belching. Recent constipation then loose stool prior to visit.  Nontoxic and nonacute abdomen. Low suspicion for appendicitis, diverticulitis/losis. No dysuria/hematuria/flank pain or other sx to point to urinary source. No vaginal discharge or bleeding.  Ultimately I think pain may be related to constipation. Other ddx include a viral gastroenteritis syndrome or biliary colic. She did have some RUQ TTP and possibly a fatty meal trigger.  - Will check a CMet to look for any derangements of hepatic or biliary systems - Recommended conservative Rx w bland diet, fluids, tylenol/motrin for pain. Also recommended bulk-forming laxative (psyllium) for constipation. - Return/call parameters discussed: dizziness, intolerable pain, fevers, persistent N/V/D, blood in stool, dysuria, flank pain, hematuria

## 2013-01-11 NOTE — Patient Instructions (Addendum)
1. Keep hydrated as much as you can.  2. Try taking an over the counter bulk-forming laxative like psyllium powder - you can add this to yogurt or cereal. You can take tylenol or motrin for pain. Do not take more than 800 mg (4 pills) of motrin every 8 hours.  3. Avoid fried foods.  4. If you have: fevers, persistent nausea/vomiting, blood in your stools or urine, or intolerable pain, please seek immediate attention.  I will let you know if there is anything abnormal in your lab results.    Psyllium granules or powder for solution What is this medicine? PSYLLIUM (SIL i yum) is a bulk-forming fiber laxative. This medicine is used to treat constipation. This medicine may be used for other purposes; ask your health care provider or pharmacist if you have questions. What should I tell my health care provider before I take this medicine? They need to know if you have any of these conditions: -change in bowel habits for more than 14 days -blocked intestines or bowel -phenylketonuria -stomach pain, nausea, or vomiting -trouble swallowing -an unusual or allergic reaction to psyllium, tartrazine dye, other medicines, dyes, or preservatives -pregnant or trying or get pregnant -breast-feeding How should I use this medicine? Mix this medicine into a full glass of water or other drink. Take by mouth. Follow the directions on the package labeling, or take as directed by your health care professional. Mix this medicine with 8 ounces or more of fluid. If the drink thickens add more fluid before drinking. Take your medicine at regular intervals. Do not take your medicine more often than directed. Talk to your pediatrician regarding the use of this medicine in children. While this drug may be prescribed for children as young as 66 years old for selected conditions, precautions do apply. Overdosage: If you think you have taken too much of this medicine contact a poison control center or emergency room at  once. NOTE: This medicine is only for you. Do not share this medicine with others. What if I miss a dose? If you miss a dose, take it as soon as you can. If it is almost time for your next dose, take only that dose. Do not take double or extra doses. What may interact with this medicine? Interactions are not expected. This list may not describe all possible interactions. Give your health care provider a list of all the medicines, herbs, non-prescription drugs, or dietary supplements you use. Also tell them if you smoke, drink alcohol, or use illegal drugs. Some items may interact with your medicine. What should I watch for while using this medicine? This medicine can take up to 3 days to work. Check with your doctor or health care professional if your symptoms do not start to get better or if they get worse. See your doctor if you have to treat your constipation for more than 1 week. Avoid taking other medicines within 2 hours of taking this medicine. Drink several glasses of water a day while you are taking this medicine. This will help to relieve constipation and prevent dehydration. Avoid breathing in this medicine. This can cause an allergic reaction or make it difficult to breathe. What side effects may I notice from receiving this medicine? Side effects that you should report to your doctor or health care professional as soon as possible: -allergic reactions like skin rash, itching or hives, swelling of the face, lips, or tongue -breathing problems -chest pain -nausea, vomiting -rectal bleeding -trouble swallowing Side  effects that usually do not require medical attention (report to your doctor or health care professional if they continue or are bothersome): -bloated or 'gassy' feeling -diarrhea -headache -stomach cramps This list may not describe all possible side effects. Call your doctor for medical advice about side effects. You may report side effects to FDA at  1-800-FDA-1088. Where should I keep my medicine? Keep out of the reach of children. Store at room temperature between 15 and 30 degrees C (59 and 86 degrees F). Protect from moisture. Throw away any unused medicine after the expiration date. NOTE: This sheet is a summary. It may not cover all possible information. If you have questions about this medicine, talk to your doctor, pharmacist, or health care provider.  2012, Elsevier/Gold Standard. (02/26/2008 4:57:28 PM)

## 2013-01-15 NOTE — Progress Notes (Signed)
Case discussed with Dr. Ziemer immediately after the resident saw the patient.  We reviewed the residens history and exam and pertinent patient test results.  I agree with the assessment, diagnosis and plan of care documented in the resident's note. 

## 2013-01-23 ENCOUNTER — Ambulatory Visit
Admission: RE | Admit: 2013-01-23 | Discharge: 2013-01-23 | Disposition: A | Discharge status: Home / Self Care | Payer: 59 | Source: Ambulatory Visit

## 2013-01-23 DIAGNOSIS — Z1231 Encounter for screening mammogram for malignant neoplasm of breast: Secondary | ICD-10-CM

## 2013-01-29 NOTE — Progress Notes (Signed)
Patient ID: Denise Stevens, female   DOB: 1966/02/05, 47 y.o.   MRN: [id number] ATTENDING PHYSICIAN NOTE: I have reviewed the chart and agree with the plan as detailed above. Denny Levy MD Pager [phone]

## 2013-04-27 ENCOUNTER — Ambulatory Visit (INDEPENDENT_AMBULATORY_CARE_PROVIDER_SITE_OTHER): Payer: Self-pay | Admitting: Family Medicine

## 2013-04-27 VITALS — BP 131/78 | HR 93 | Ht 66.0 in | Wt 311.0 lb

## 2013-04-27 DIAGNOSIS — E119 Type 2 diabetes mellitus without complications: Secondary | ICD-10-CM

## 2013-04-27 NOTE — Progress Notes (Signed)
Subjective:  Patient presents today for 3 month diabetes follow-up as part of the employer-sponsored Link to Wellness program. Current diabetes regimen includes metformin, however patient is not taking it at present. Patient also continues on daily ARB and statin.   Patient states that her last appointment with Dr. Renae Gloss was over the summer. She had an abnormal pap smear result and Dr. Renae Gloss wants her to go and see an OB/GYN for further evaluation. Patient had a complete hysterectomy in 2007 so I am unsure what tissue was tested for the Pap smear. Patient states that she tested positive for HPV. She cannot afford to see an OB/GYN now so she will wait.   The last A1C she had was in April, with a result of 7.1%. She has an appointment pending with Dr. Renae Gloss in a few weeks. I will defer A1C testing to that visit.   Medication Issues- She is not taking the metformin because of GI upset. At last visit I had the metformin changed to the ER formulation. She took it for 2 weeks and could not handle the gas, diarrhea and the sounds her stomach would make.   She is 30 days late for a refill on escitalopram and 11 days late for bupropion. She admits that sometimes she does not take her medication on the weekend.    Disease Assessments:  Diabetes:  Type of Diabetes: Type 2; Year of diagnosis 2011; Sees Diabetes provider 3 times per year; Diabetes Education Formal classes at the [facility]; MD managing Diabetes Dr. Renae Gloss; checks feet daily; does not take an aspirin a day; does not use glucometer; hypoglycemia frequency never; takes medications as prescribed; checks blood glucose less than 1 time a week;   7 day CBG average 101; 14 day CBG average 98; 30 day CBG average 112; Highest CBG 78; Lowest CBG 130;   Other Diabetes History: She has not been checking her blood sugar. She has checked it a few times since her last appointment. She has checked a few times this past week and then no other checks since  August.    Physical Activity-   She was walking for about 6 weeks over the summer with a coworker but then stopped because she was sweating too much. Her cousins want to do a 5K in November.   Nutrition-   B- steak biscuit from the cafeteria (she states she has been on this kick for the last several weeks). Sometimes instead of the biscuit she has pancakes and scrambled eggs.  L- three times a week her department hosts lunch for the residents. She mostly has sandwiches or sometimes the pinwheel sandwiches or grilled chicken. Occasionally she has chicken wings from the cafeteria.   She has dropped back to just 1 brownie at lunch. She reports she has been better with snacking on sweets.   Afternoon snack- sugar free vanilla pudding or greek yogurt.   Dinner- last night was fried chicken and pinto beans. She reports that she isn't getting vegetables as much as she was before. Sometimes she has a salad but that is not common. She is eating beans. She picks up dinner from the cafeteria before she goes home. Usually it is a starchy vegetable.   She is not cooking at home at all, and mostly eats from [employer].   Assessment/Plan: Patient is a 47 year old female with DM2. She has not had an A1C since her last appointment with me, but she will have one at her upcoming appointment  with Dr. Renae Gloss in a few weeks. Her last A1C was close to goal at 7.1%.   Patient admits that since her last appointment she has fallen off the wagon. She is no longer exercising and no longer cooking for herself. She lives alone and states she has a hard time just cooking for 1 person. Weight is stable from her appointment 6 months ago, but she has gained back the few pounds she had lost at her last appointment.   Patient expressed the desire to walk a 5K with her sister and cousins in honor of her Aunt who died of breast cancer. I told her I thought this was a great idea that she have something she can work towards.  I encouraged her to start walking again. Patient admits that she felt better when she was walking and slept better. Patient agreed to start walking again with her coworker over lunch and to also start walking in the evening twice a week.   I discussed with patient ways that she can increase her vegetable intake and decrease carbohydrate intake with dinner. I encouraged her to start eating either salad or steamed vegetables with her dinner in the evening.   Patient admits to skipping doses of escitalopram and bupropion. I explained that skipping doses of these medications could be harmful and that it was more beneficial to take these medications on a regular basis. Patient reports she has had some side effects (dizziness) when she doesn't take her medication and agreed to be more consistent. She also agreed to restart metformin and to give it a 3 week trial before she discontinues. I encouraged her to take it with dinner and to start with a half tablet.   Follow up with patient in 3 months..    Goals for Next Visit-  1. Try taking the metformin again. Take 1/2 tablet with dinner. Try it for at least 3 weeks before you give up on it. See if the GI issues get better. 2. Add a non starchy vegetable with dinner. Ideas- bagged salad with cucumber and tomato or steamed frozen vegetables. Try to get 1 serving of vegetables with dinner every night. 3. Physical Activity- get yourself ready for the 5K in November. Start walking on your lunch break with your coworker most days of the week. In the evenings start with two nights a week walking around your complex. Best days to do it- Mondays and Thursdays.  Next appointment with me is Friday December 19th at 8:30 AM.

## 2013-06-14 ENCOUNTER — Other Ambulatory Visit: Payer: Self-pay

## 2013-06-22 ENCOUNTER — Other Ambulatory Visit: Payer: Self-pay | Admitting: Internal Medicine

## 2013-06-22 ENCOUNTER — Other Ambulatory Visit (INDEPENDENT_AMBULATORY_CARE_PROVIDER_SITE_OTHER): Payer: 59

## 2013-06-22 DIAGNOSIS — R1011 Right upper quadrant pain: Secondary | ICD-10-CM

## 2013-06-22 DIAGNOSIS — E119 Type 2 diabetes mellitus without complications: Secondary | ICD-10-CM

## 2013-06-22 LAB — COMPLETE METABOLIC PANEL WITH GFR
AST: 13 U/L (ref 0–37)
Albumin: 4 g/dL (ref 3.5–5.2)
BUN: 12 mg/dL (ref 6–23)
CO2: 32 mEq/L (ref 19–32)
Calcium: 9.6 mg/dL (ref 8.4–10.5)
Chloride: 98 mEq/L (ref 96–112)
Creat: 0.8 mg/dL (ref 0.50–1.10)
GFR, Est African American: >89 mL/min
GFR, Est Non African American: 87 mL/min
Glucose, Bld: 112 mg/dL — ABNORMAL HIGH (ref 70–99)
Potassium: 3.9 mEq/L (ref 3.5–5.3)

## 2013-06-22 LAB — CBC
HCT: 34.7 % — ABNORMAL LOW (ref 36.0–46.0)
Hemoglobin: 11.7 g/dL — ABNORMAL LOW (ref 12.0–15.0)
MCV: 80.9 fL (ref 78.0–100.0)
RBC: 4.29 MIL/uL (ref 3.87–5.11)
RDW: 15.3 % (ref 11.5–15.5)
WBC: 10.5 10*3/uL (ref 4.0–10.5)

## 2013-07-27 ENCOUNTER — Ambulatory Visit (INDEPENDENT_AMBULATORY_CARE_PROVIDER_SITE_OTHER): Payer: Self-pay | Admitting: Family Medicine

## 2013-07-27 VITALS — BP 149/82 | HR 80 | Ht 66.0 in | Wt 312.6 lb

## 2013-07-27 DIAGNOSIS — E119 Type 2 diabetes mellitus without complications: Secondary | ICD-10-CM

## 2013-07-27 NOTE — Progress Notes (Signed)
Subjective:  Patient presents today for 3 month diabetes follow-up as part of the employer-sponsored Link to Wellness program. She is not currently taking any medication for diabetes. Patient also continues on daily ARB and statin.   Last visit with Renae Gloss was in November. A1C at that point was 6.8%. She was having the abdominal pain and she had an ultrasound that showed fatty liver. Liver enzymes were all WNL. She follows up with Dr. Renae Gloss in February.   Patient is on a waiting list for the BELT program at [facility]. She will likely be able to start the program sometime this spring.   Patient is no longer taking Tanzeum (d/c due to N/V) or metformin (also d/c due to N/V).    Disease Assessments:  Diabetes:  Type of Diabetes: Type 2; Year of diagnosis 2011; Sees Diabetes provider 3 times per year; Diabetes Education Formal classes at the [facility]; MD managing Diabetes Dr. Renae Gloss; checks feet daily; does not take an aspirin a day; does not use glucometer; checks blood glucose Never; hypoglycemia frequency rare; does not take medications as prescribed;   Other Diabetes History: Patient has not been checking her blood sugar.   Patient states that she has felt like her blood sugar has gotten low a few times since her last appointment with me. She does not check her blood sugar at this point but has something to eat. She feels better after she has something to eat.   She requested a refill on her testing supplies today. She has not had test strips filled since 2013.     Physical Activity-   She is counting her steps with an app on her phone (she keeps the phone in her pocket). She states that on average she is getting 1800 steps a day. She reports that since she started tracking her steps they have increased some. One day she got 5000 steps and she reports ","I thought I was going to die",".   Nutrition-   No major changes. Patient is still eating breakfast from the cafeteria, lunch is catered and  then she is picking up dinner from the cafeteria to eat when she goes home. She doesn't cook at home during the week; she does cook some on the weekends. She usually eats out at [facility] on Saturdays with her son.   One of her goals last time was to increase vegetable intake with dinner. She has done this and is picking up a vegetable with dinner when she gets it from the cafeteria.   She requested a referral to the dietitian.        Hemoglobin A1c: 06/11/2013  via dr. Renae Gloss 6.8    Dilated Eye Exam: 09/08/2012  Flu vaccine: 04/09/2013  Foot Exam: 10/18/2011  Other Preventive Care Notes:  Dental visit- October        Vital Signs:  07/27/2013 9:20 AM (EST) BMI 50.4; Height 5 ft 6 in; Weight 312.6 lbs    Assessment/Plan: Patient is a 47 year old female with DM2. Most recent A1C was 6.8% and is meeting goal of less than 7%. She has made some beneficial changes since her last appointment with me. She is now eating more vegetables with lunch and dinner. However, she still continues to consume most of her meals from the cafeteria here at work or from eating out fast food. Patient expressed a desire to lose weight and asked for a referral for a dietitian. I gave her the information on how to schedule an appointment to  see either Wyona Almas or an RD with the [facility]. I also gave patient information on the weigh to wellness class offered by [facility]. Especially given other emotional and mental health issues, I think that the class could be very beneficial for her.   Patient has not been exercising since her last appointment with me. Both her sister and Mom backed out of the 5K in November and she never started training for it. Patient is now on the waiting list to star the BELT program at [facility], but she will likely not be able to start the program until April or May. We discussed ways that she could increased her physical activity now. She is tracking her steps with her phone and she is getting  around 1800. I explained that the eventual goal would be to get to 10,000 steps. Patient laughed out loud when I told her this and she told me there was "no way" she could do that. We set a short term goal to increase to 2500 steps.   We spent part of the visit discussing blood sugar goals. Patient agreed to start checking blood sugar daily in the morning. I contacted Dr. Mathews Robinsons office to get a refill on her testing supplies. Patient had several questions about the benefits of losing weight- whether it was better for her health or just better for her blood sugar to lose weight. I explained to her that losing weight would be beneficial for both her glycemic control and overall health. I encouraged her to shoot for a 7-10% weight loss and explained that even that small amount of weight loss could have a dramatic impact on her glycemic control.   Follow up with patient in 3 months..    Goals for Next Visit-  1. Increase your overall step count to 2500 steps a day. Ideas to accomplish this- walk around the halls of the hospital before you leave for the day; walk loops around the parking lot at your apartment complex before you go inside, walk the parking lot of the bowling alley on Saturday mornings when you are there to bowl.  2. Dietitian options:  Decide whether you want to take the weigh to wellness class or if you want to meet with a dietitian one on one.   [facility], [phone] [facility]) Wyona Almas, [facility], [phone] [facility]) (Please note that [facility] is currently not accepting new patients but is hoping to start again in January)  3. Start checking your blood sugar every day in the morning. Fasting goal is between 80-120; 2 hours after you eat the goal is less than 140.   Next appointment to see me is Friday March 20th at 8:45 AM.

## 2013-10-16 NOTE — Progress Notes (Signed)
Patient ID: Denise Stevens, female   DOB: 06/27/1966, 47 y.o.   MRN: [id number] ATTENDING PHYSICIAN NOTE: I have reviewed the chart and agree with the plan as detailed above. Sara Neal MD Pager [phone]  

## 2013-10-16 NOTE — Progress Notes (Signed)
Patient ID: Denise Stevens, female   DOB: 1966-06-01, 48 y.o.   MRN: [id number] ATTENDING PHYSICIAN NOTE: I have reviewed the chart and agree with the plan as detailed above. Dorcas Mcmurray MD Pager [phone]

## 2013-10-26 ENCOUNTER — Ambulatory Visit (INDEPENDENT_AMBULATORY_CARE_PROVIDER_SITE_OTHER): Payer: Self-pay | Admitting: Family Medicine

## 2013-10-26 VITALS — BP 124/78 | HR 78 | Ht 66.0 in | Wt 311.6 lb

## 2013-10-26 DIAGNOSIS — E119 Type 2 diabetes mellitus without complications: Secondary | ICD-10-CM

## 2013-10-26 NOTE — Progress Notes (Signed)
Subjective:  Patient presents today for 3 month diabetes follow-up as part of the employer-sponsored Link to Wellness program. Current diabetes regimen includes no medications for diabetes. Patient also continues on daily ARB and statin.   Patient states that she has not been doing well since her last appointment with me. She was walking for a few weeks and then stopped. She states that she doesn't know why she quit. She states that she thinks she will always be overweight and doesn't think that she will be able to lose weight.   However, she signed up for the the BELT program at [facility] and is signed up for the 16 week program. Her first day is next Friday March 27th.   She has a pending visit with Dr. Karlton Lemon in April. She needs to find a new PCP because Dr. Karlton Lemon is changing practices and will be charging a $1000 yearly fee which patient cannot afford.    Disease Assessments:  Diabetes:  Type of Diabetes: Type 2; Year of diagnosis 2011; Sees Diabetes provider 3 times per year; Diabetes Education Formal classes at the [facility]; MD managing Diabetes Dr. Karlton Lemon; checks feet daily; does not take an aspirin a day; does not use glucometer; checks blood glucose Never; hypoglycemia frequency rare; does not take medications as prescribed; Other Diabetes History: Patient has not been checking her blood sugar. She lost her blood sugar meter and just found it a few weeks ago. She did not bring her meter with her to the appointment.     Physical Activity-   She is counting her steps with an app on her phone (she keeps the phone in her pocket). She reports that she is usually getting 1500 steps daily. She was walking for a few weeks after her last appointment, but stopped that after February. She is signed up for the BELT program and starts next week.   Nutrition-   No major changes. Patient is still eating breakfast from the cafeteria, lunch is catered and then she is picking up dinner from the cafeteria to eat  when she goes home. She doesn't cook at home during the week; she does cook some on the weekends. She reports that she cooks at home every several weeks.   She reports that she has been doing better about eating vegetables with dinner. She is eating cabbage, green beans and some asparagus. She has also been eating fruits- oranges. At her last visit she wanted to see a dietitian. She states that she didn't follow through with the referral and is not interested in seeing an RD now.      Hemoglobin A1c: 10/26/2013  Via POCT 7.0    Dilated Eye Exam: 09/08/2012  Flu vaccine: 04/09/2013  Foot Exam: 10/18/2011  Other Preventive Care Notes:  Dental visit- October        Vital Signs:  10/26/2013 5:58 PM (EST) Blood Pressure 124 / 78 mm/HgBMI 50.3; Height 5 ft 6 in; Pulse Rate 78 bpm; Weight 311.6 lbs    Testing:  Blood Sugar Tests:  Hemoglobin A1c: 7.0 via POCT resulted on 10/26/2013    Assesment/Plan: Patient is a 48 year old female with DM2. A1C today is 7.0% and is meeting goal of 7% or less. However, this represents an increased since last visit when A1C was 6.8%, and a gradual increase from 6.6% in 2013. Patient was disappointed with the result today. Weight is stable from last visit with me. In the past patient was taking metformin but was unable to  tolerate the side effects. To my knowledge she hasn't taken any other medications for diabetes. She has a pending appointment with Dr. Karlton Lemon soon.   Patient could benefit from both changes in physical activity and in eating habits. She was walking for a few weeks at one point but states that it was difficult to stay motivated to keep walking. She is now signed up for the BELT program with [facility] and starts next week. We spent the majority of the visit discussing preparations for the BELT program and I tried to eliminate any excuses or reasons that patient might use to talk herself out of going. If she can stick with the program I think she could  be very successful and I explained to patient that regular physical activity and weight loss would lead to improved glycemic control.   I told patient that for now she needs to focus on physical activity, and at the next visit we would discuss nutrition changes. She previously was interested in seeing a dietitian, but then decided against it. She rarely cooks for herself and the majority of her meals come from the hospital cafeteria and from catered lunches through the internal medicine teaching service. She states that she cannot afford to eat healthy. I explained to her that it may be less expensive for her to cook for herself than to eat in the cafeteria.   I will fax A1C results to Dr. Karlton Lemon. Follow up with patient regarding physical activity. At next visit discuss nutrition changes.   Follow up with patient in 4 months when I return from maternity leave..    Goals for Next Visit-  1. This weekend, find clothes that you can wear for the BELT program. T-Shirt, shorts or capris, athletic socks and sports bra.  2. Bring a water bottle and towel with you to the BELT program.  3. Before Friday, pack a bag that has everything you will need to get ready for work. 4. Start checking your blood sugar again at least once a week.   Next appointment to see me is Monday August 3rd at 8:30 AM.

## 2014-01-04 ENCOUNTER — Other Ambulatory Visit: Payer: Self-pay

## 2014-01-04 DIAGNOSIS — Z1231 Encounter for screening mammogram for malignant neoplasm of breast: Secondary | ICD-10-CM

## 2014-01-21 ENCOUNTER — Ambulatory Visit (INDEPENDENT_AMBULATORY_CARE_PROVIDER_SITE_OTHER): Payer: 59 | Admitting: Internal Medicine

## 2014-01-21 ENCOUNTER — Encounter: Payer: Self-pay | Admitting: Internal Medicine

## 2014-01-21 VITALS — BP 122/78 | HR 88 | Ht 65.5 in | Wt 312.8 lb

## 2014-01-21 DIAGNOSIS — G4733 Obstructive sleep apnea (adult) (pediatric): Secondary | ICD-10-CM

## 2014-01-21 NOTE — Progress Notes (Signed)
01/21/14- 52 yoF referred for sleep medicine evaluation by Dr Ronal Fear using CPAP as she should.  NPSG 10/22/06 -mild OSA AHI 13.8/ hr, [facility] Dr Jaynie Collins. CPAP titrated to 8 CWP and she wore CPAP from [facility]. Leaking mask bothers her and her DME company no longer has a local office. She pulls the mask off after 4 hours at night, bothered by the noise. Nasal pillows mask. Bedtime 9:30 to 11 PM, sleep latency 40 minutes, waking once or twice before up at 8 AM. No ENT surgery. CPAP did seem to help her sleep better and prevented headaches which are now quite frequent. She is complaining of trouble concentrating and poor memory as she tries to study Careers information officer. She describes episodes of waking coughing and choking at night, having to go into the bathroom. 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 A brother and sister have sleep apnea  Prior to Admission medications   Medication Sig Start Date End Date Taking? Authorizing Provider  ALPRAZolam (XANAX) 0.25 MG tablet Take 0.25 mg by mouth 3 (three) times daily as needed for anxiety.   Yes Historical Provider, MD  buPROPion (WELLBUTRIN XL) 150 MG 24 hr tablet Take 150 mg by mouth daily.     Yes Historical Provider, MD  cetirizine (ZYRTEC) 10 MG tablet Take 10 mg by mouth at bedtime.   Yes Historical Provider, MD  cycloSPORINE (RESTASIS) 0.05 % ophthalmic emulsion Place 1 drop into both eyes 2 (two) times daily as needed.    Yes Historical Provider, MD  escitalopram (LEXAPRO) 10 MG tablet Take 10 mg by mouth daily.     Yes Historical Provider, MD  mometasone (ASMANEX) 220 MCG/INH inhaler Inhale 1 puff into the lungs daily.   Yes Historical Provider, MD  pantoprazole (PROTONIX) 40 MG tablet Take 40 mg by mouth daily.   Yes Historical Provider, MD  pravastatin (PRAVACHOL) 40 MG tablet Take 40 mg by mouth daily.   Yes  Historical Provider, MD  ranitidine (ZANTAC) 300 MG tablet Take 300 mg by mouth at bedtime.   Yes Historical Provider, MD  valsartan-hydrochlorothiazide (DIOVAN-HCT) 160-25 MG per tablet Take 1 tablet by mouth daily.   Yes Historical Provider, MD   Past Medical History  Diagnosis Date  . HTN (hypertension)   . Hyperlipidemia   . Depression   . Asthma     no intubation or hospitalization hx  . GERD (gastroesophageal reflux disease)   . Diabetes mellitus    Past Surgical History  Procedure Laterality Date  . Vesicovaginal fistula closure w/ tah      nov 07  . Rotator cuff repair      R 2000  . Ganglion cyst excision      R wrist 2000  . Marsupialization urethral diverticulum      2012  . Abdominal hysterectomy     Family History  Problem Relation Age of Onset  . Hyperlipidemia Mother   . Hypertension Mother   . Kidney disease Mother   . Diabetes Mother   . Cancer Father     prostate  . Heart attack Neg Hx    History   Social History  . Marital Status: Married    Spouse Name: N/A    Number of Children: N/A  . Years of Education: N/A   Occupational History  . Not on file.   Social History  Main Topics  . Smoking status: Never Smoker   . Smokeless tobacco: Not on file  . Alcohol Use: Yes     Comment: wine  . Drug Use: No  . Sexual Activity: Not on file   Other Topics Concern  . Not on file   Social History Narrative  . No narrative on file   ROS-see HPI Constitutional:   No-   weight loss, night sweats, fevers, chills, +fatigue, lassitude. HEENT:   + headaches,no- difficulty swallowing, +tooth/dental problems, +sore throat,       No-  sneezing, itching, ear ache, +nasal congestion, post nasal drip,  CV:  No-   chest pain, orthopnea, PND, swelling in lower extremities, anasarca,                                  dizziness, palpitations Resp: No-   shortness of breath with exertion or at rest.              No-   productive cough,  + non-productive cough,  No-  coughing up of blood.              No-   change in color of mucus.  No- wheezing.   Skin: No-   rash or lesions. GI:  +heartburn,+ indigestion, abdominal pain, nausea, vomiting, diarrhea,                 change in bowel habits, loss of appetite GU: No-   dysuria, change in color of urine, no urgency or frequency.  No- flank pain. MS:  + joint pain or swelling.  No- decreased range of motion.  No- back pain. Neuro-     nothing unusual Psych:  No- change in mood or affect. No depression or anxiety.  + memory loss.  OBJ- Physical Exam General- Alert, Oriented, Affect-appropriate, Distress- none acute, +obese Skin- rash-none, lesions- none, excoriation- none Lymphadenopathy- none Head- atraumatic            Eyes- Gross vision intact, PERRLA, conjunctivae and secretions clear            Ears- Hearing, canals-normal            Nose- Clear, no-Septal dev, mucus, polyps, erosion, perforation             Throat- Mallampati II , mucosa clear , drainage- none, tonsils- small Neck- flexible , trachea midline, no stridor , thyroid nl, carotid no bruit Chest - symmetrical excursion , unlabored           Heart/CV- RRR , no murmur , no gallop  , no rub, nl s1 s2                           - JVD- none , edema- none, stasis changes- none, varices- none           Lung- clear to P&A, wheeze- none, cough- none , dullness-none, rub- none           Chest wall-  Abd- tender-no, distended-no, bowel sounds-present, HSM- no Br/ Gen/ Rectal- Not done, not indicated Extrem- cyanosis- none, clubbing, none, atrophy- none, strength- nl Neuro- grossly intact to observation

## 2014-01-21 NOTE — Assessment & Plan Note (Signed)
Original study is now 50 years old and her DME company is no longer accessible to her. She is frustrated by poor mask fit. Plan-we reviewed the medical concerns of untreated sleep apnea and compared CPAP, oral appliances, surgery and weight loss interventions. Plan- update documentation with new sleep study. Finances are a concern and we will look at options.

## 2014-01-21 NOTE — Patient Instructions (Signed)
Order- Schedule Unattended home sleep study    Dx OSA  Please call as needed

## 2014-01-24 ENCOUNTER — Ambulatory Visit
Admission: RE | Admit: 2014-01-24 | Discharge: 2014-01-24 | Disposition: A | Discharge status: Home / Self Care | Payer: 59 | Source: Ambulatory Visit

## 2014-01-24 DIAGNOSIS — Z1231 Encounter for screening mammogram for malignant neoplasm of breast: Secondary | ICD-10-CM

## 2014-02-19 ENCOUNTER — Other Ambulatory Visit: Payer: Self-pay | Admitting: Internal Medicine

## 2014-02-19 DIAGNOSIS — R51 Headache: Secondary | ICD-10-CM

## 2014-02-19 MED ORDER — KETOROLAC TROMETHAMINE 30 MG/ML IJ SOLN
30.0000 mg | Freq: Once | INTRAMUSCULAR | Status: AC
  Administered 2014-02-19: 30 mg via INTRAMUSCULAR

## 2014-02-19 MED ORDER — KETOROLAC TROMETHAMINE 30 MG/ML IM SOLN: 30.0000 mg | Freq: Once | INTRAMUSCULAR | Status: DC

## 2014-02-26 ENCOUNTER — Telehealth: Payer: Self-pay | Admitting: Internal Medicine

## 2014-02-26 NOTE — Telephone Encounter (Signed)
Er 01/21/14; Return in about 1 month (around 02/20/2014 Order- Schedule Unattended home sleep study    Dx OSA --  Pt has pending appt 02/28/14. Pt has not heard back regarding HST. Please advise PCC's thanks

## 2014-02-26 NOTE — Telephone Encounter (Signed)
Called and spoke with patient. My device is going out tomorrow, however, Alida's device will be here. Pt can come and pick up machine on Wed 02/27/14 between 2:00 to 4:30 and return device on 02/28/14 when she has her appointment with Dr. Annamaria Boots. I checked with Dr. Annamaria Boots and he stated that patient could keep the appointment on 02/28/14 and he can go over the sleep study at the time of her appointment. Rhonda J Cobb LMOAM for pt to return my call. Rhonda J Cobb

## 2014-02-27 NOTE — Telephone Encounter (Signed)
Pt returned call and r/s HST for Wed 03/06/14. Pt will p/u device and return device on Thurs. 03/07/14. Pt is aware to come to front desk and ask for me. Rhonda J Cobb

## 2014-02-27 NOTE — Telephone Encounter (Signed)
lmomtcb x 2  

## 2014-02-27 NOTE — Telephone Encounter (Signed)
Attempted to call patient back at [phone], no answer and no voice mail to r/s her HST for next week. Will attempt to call back later. Rhonda J Cobb

## 2014-02-27 NOTE — Telephone Encounter (Signed)
Pt returned call.  Spoke with patient and discussed with her about the home sleep study machine will be available for pick up this afternoon and she may return the machine when she comes for her appt with [facility] tomorrow 7/23.  Pt stated that she is having some transportation/car issues and unfortunately will not be able to come pick up the machine today, nor will she be able to keep her appt tomorrow.  Pt is asking if there is anyway that she can pick up a machine next week?  She is aware that there is a waiting list.  PCC's please advise, thank you.  Pt's 7/23 appt has been cancelled.  **call pt back at her desk phone [phone]**

## 2014-02-28 ENCOUNTER — Ambulatory Visit: Payer: 59 | Admitting: Internal Medicine

## 2014-03-04 ENCOUNTER — Telehealth: Payer: Self-pay | Admitting: Internal Medicine

## 2014-03-04 NOTE — Telephone Encounter (Signed)
OK 

## 2014-03-04 NOTE — Telephone Encounter (Signed)
Denise Stevens had scheduled pt to come pick up her Denise Stevens to do HST on 5/99/35.  Due to Denise Stevens being off for bereavement leave until 03/07/14, I called pt to confirm the time she had set up to pick up Denise Stevens from [place].  Pt stated she was supposed to come at approx 4:30 pm on 03/06/14.  I advised since Denise Stevens is off due to a death in her family I would be the one setting her up now with the [place].  I advised pt I would be leaving the office early on 03/06/14 and asked if she could come a little earlier, by 3:30 if possible.  Pt stated that "my job is in jeopardy with [employer] & I do not need to take any more time off" therefore she stated she wished to cancel the HST.  I offered to get Denise Stevens or Denise Stevens to set the pt up for a later time but she still stated she wished to just cancel the HST Denise Stevens

## 2014-03-11 ENCOUNTER — Encounter: Payer: Self-pay | Admitting: Family Medicine

## 2014-03-11 NOTE — Progress Notes (Signed)
Patient ID: Denise Stevens, female   DOB: [birth year], 48 y.o.   MRN: [id number] Reviewed: Agree with our Pharmacologist's documentation and management.

## 2014-04-05 ENCOUNTER — Ambulatory Visit (INDEPENDENT_AMBULATORY_CARE_PROVIDER_SITE_OTHER): Payer: Self-pay | Admitting: Family Medicine

## 2014-04-05 VITALS — BP 126/70 | Ht 66.0 in | Wt 315.8 lb

## 2014-04-05 DIAGNOSIS — E119 Type 2 diabetes mellitus without complications: Secondary | ICD-10-CM

## 2014-04-05 DIAGNOSIS — E1165 Type 2 diabetes mellitus with hyperglycemia: Secondary | ICD-10-CM

## 2014-04-05 NOTE — Progress Notes (Signed)
Subjective:  Patient presents today for 3 month diabetes follow-up as part of the employer-sponsored Link to Wellness program. Current diabetes regimen includes no medications. Patient also continues on daily ARB, and statin. No major health changes at this time. She used to see Dr. Karlton Lemon, but now she is seeing Priscille Loveless, [place]. She is looking for a new PCP. Her last A1C was in April, 7.7%. She wants to start seeing Dr. Doug Sou with [place]. She was not started on any medications. She has not tolerated metformin, Victoza or Tanzeum in the past due to GI concerns.  She went to see an allergist over the summer when she had a URI that would not heal. The allergist put her on Asmanex and ranitidine for GERD and that seems to help her breathing. She saw Dr. Annamaria Boots over the summer to try and get restarted on a CPAP machine. She did not complete the sleep study. Her department was reorganized along with [facility] and Pediatric Teaching. 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 This came with a pay raise but her responsibilities have significantly increased. She is an exempt employee now and is working longer hours. She reports that she is not sleeping as much and is more stressed.   Disease Assessments:  Diabetes: Type of Diabetes: Type 2; Year of diagnosis 2011; Sees Diabetes provider 3 times per year; Diabetes Education Formal classes at the [facility]; MD managing Diabetes Dr. Karlton Lemon; checks feet daily; does not take an aspirin a day;  does not use glucometer; checks blood glucose Never; hypoglycemia frequency rare; does not take medications as prescribed; Other Diabetes History:  She has not been checking her blood sugar. She did not bring her meter with her to this appointment. A1C today was improved- 7.1%. I strongly encouraged her to check her CBG at least once a week. If she restarts the BELT program she will need to check it prior to each exercise day  (MWF) as she has to have a CBG in order to exercise.       Social History:  Exercise habits: minimal; No drug use; Diet adherence 50-75% of the time;  Exercise adherence Never; Patient cannot afford medications; Patient knows the purpose/use of medications; Pharmacy assist consult was not needed; Pharmacist consult was done; Caffeine use: soda 1 per day [facility]; 0 minutes of exercise per week; Alcohol use: 1 - 2 drinks per month;   Medication adherence adherent, cannot afford Patient is late on several refills for Asmanex and ranitidine. Patient states that she cannot refill them until her next paycheck in 2 weeks.Marland Kitchen      Physical Activity-  She completed 6 weeks of the BELT program over the summer. She stopped because she was discouraged that she wasn't making progress losing weight or losing inches. She does report that she is able to use the treadmill longer than she was before. She wants to start exercising because she thinks that it will help with stress.  Nutrition-  She reports that she has decreased portion sizes in the past few weeks. For Breakfast now she is either doing a boiled egg and raisin bran with nonfat milk. She has tried to eat more salads at lunch and also resist sweet treats when they come into the office.  She would like to see a dietitian for further assistance with her diet.  Assessment/Plan:  Patient is a 48 year old female with DM2. A1C today was 7.1% and is slightly above goal of less than 7%. However, this is a decrease from 7.7% in April. Patient is not taking any medication for DM and is attempting to manage it with lifestyle changes. She has failed metformin, Tanzeum and Victoza in the past. She has not tried any additional DM medications. She is in the middle of changing primary care providers and would like to see Dr. Doug Sou with [facility] in October. If A1C is still elevated at that visit patient could  benefit from the addition of an oral agent such as Januvia. Patient has made some changes to her eating habits in the past few weeks. Patient voiced a desire to see a dietitian again. She has been resistant to see one in the past so this is a change for her. I encouraged her to make an appointment to see an RD at the [facility] and gave her the number to call to make an appointment. Patient was briefly enrolled in the BELT program earlier this year and completed several weeks of the program. She states that she stopped the program because her weight had not changed any. She does report though that she was happier when she was in the program, she felt better, her stress level was improved and her clothes were getting loose. I strongly encouraged her to return to the BELT program. She has been more successful when she is in an organized physical activity program. I also told her not to focus on her actual weight and to have the trainers there not tell her what her weight was during weigh ins. I asked her to instead judge her success based on how she felt, how her stress levels were, how her clothes fit, how her exercise tolerance was improving and overall how she felt about herself. I will fax A1C results to Priscille Loveless, FNP. Follow up with patient in 3 months.       Goals for Next Visit- 1. Drink more water during the day. 2. Make an appointment to see a RD at the [facility]. [phone]. 3. Call the BELT program today and see if you can start going next week. Remember, this will help with stress, this will help you feel better, your clothes will fit better. Instead of looking for a number (weight) to meausre your success in exercise, ask yourself the following questions- 1. How do I feel? How is my stress level since I've been exercising? 2. How do my clothes fit? Are they looser or tighter? 3. How do I feel about myself? Am I feeling more confident in my abilities? 4. Have I improved my exercise  tolerance?   4. Start checking your blood sugar once a week on Mondays. 5. Call and make an appointment to see Dr. Doug Sou. Next appointment to see me is Monday November 30th at 8:30 AM.

## 2014-06-14 ENCOUNTER — Encounter: Payer: Self-pay | Admitting: Family Medicine

## 2014-06-14 NOTE — Progress Notes (Signed)
Patient ID: Denise Stevens, female   DOB: 02/11/1966, 47 y.o.   MRN: [id number] Reviewed: Agree with the documentation and management of our [place] Pharmacologist. 

## 2014-07-01 ENCOUNTER — Other Ambulatory Visit: Payer: Self-pay | Admitting: Internal Medicine

## 2014-07-01 MED ORDER — ALPRAZOLAM 0.25 MG PO TABS: 0.2500 mg | ORAL_TABLET | Freq: Three times a day (TID) | ORAL | Status: DC | PRN

## 2014-07-01 NOTE — Progress Notes (Unsigned)
Denise Stevens is between physicians right now. Dr Donneta Romberg appt is Dec 29th. Out of Xanax 0.25 prn. Hasn't used any for 2 months. Anxiety increased now with mother's health issues. Takes one half pill when cannot sleep. Last Rx'd 10/2013 for 30. Low risk so will refill #15 until she can get into Dr Doug Sou.

## 2014-07-01 NOTE — Progress Notes (Signed)
Called into pharmacy

## 2014-07-08 ENCOUNTER — Ambulatory Visit (INDEPENDENT_AMBULATORY_CARE_PROVIDER_SITE_OTHER): Payer: Self-pay | Admitting: Family Medicine

## 2014-07-08 VITALS — BP 126/68 | Wt 314.0 lb

## 2014-07-08 DIAGNOSIS — E119 Type 2 diabetes mellitus without complications: Secondary | ICD-10-CM

## 2014-07-08 NOTE — Assessment & Plan Note (Signed)
Subjective:  Patient presents today for 3 month diabetes follow-up as part of the employer-sponsored Link to Wellness program. Current diabetes regimen includes no medication for diabetes management. Patient also continues on daily ARB, and statin. She was previously a patient of Dr. Karlton Lemon but hasn't seen her since several months. She has selected a new PCP and her next appointment with Dr. Doug Sou (new PCP) is 12/29.  Patient admits that she hasn't been doing very well in taking care of her diabetes. She has not started back with the BELT program and she hasn't seen a dietitian (2 goals from last visit). She states that she is joining a weight loss competition at the beginning of the year. It is her office ([employer]) versus the [place] team. There is a Physiological scientist at the end of the competition. She is very excited about this competition and seems motivated to try and lose weight in the new year. She also has a new boyfriend that has been encouraging her to exercise and improve her eating habits.  Refilled several medications for patient today. She is adherent to escitalopram and bupropion, but is 30-90 days late filling everything else (BP, cholesterol, GI meds).   Disease Assessments:  Diabetes: Type of Diabetes: Type 2; Year of diagnosis 2011; Sees Diabetes provider 3 times per year; Diabetes Education Formal classes at the [employer]; MD managing Diabetes Dr. Karlton Lemon; checks feet daily; does not take an aspirin a day; does not use glucometer; checks blood glucose Never; hypoglycemia frequency rare; does not take medications as prescribed;   Other Diabetes History:  She states that she has not been regularly checking her blood sugar. She denies hypoglycemia. She is not currently taking any medications for Victoza. She has failed metformin due to diarrhea and stomach cramping. Failed both Victoza and Tanzeum due to nausea.          Tobacco Assessment: Smoking Status: Never smoker; Last Reviewed: 07/08/2014    Social History:  Exercise habits: minimal; No drug use; Diet adherence 50-75% of the time; Exercise adherence Never; Patient cannot afford medications; Patient knows the purpose/use of medications; Pharmacy assist consult was not needed; Pharmacist consult was done; Caffeine use: soda 1 per day [facility]; 0 minutes of exercise per week; Alcohol use: 1 - 2 drinks per month; Medication adherence adherent, cannot afford Refilled medications today. Several were late in filling. Date of Assessment: 07/08/2014.  Home Environment: SafeRacial background: African American  Occupation: Network engineer  Physical Activity-  Nothing in addition to daily walking. She has a new boyfriend and he likes to walk outside so she has been walking a little more on the weekends.  Nutrition-  She reports that she continues to decrease portion sizes. She has cut out pork since she started dating her new boyfriend who is Muslim. She has replaced this with [place] products.  For Breakfast now she is either doing a boiled egg and raisin bran with nonfat milk. She has tried to eat more salads at lunch and also resist sweet treats when they come into the office.  One of her goals last time was to make an appointment to see a dietitian. She has not made this appointment yet.    Preventive Care:    Hemoglobin A1c: 07/08/2014  Via POCT 7.2    Dilated Eye Exam: 09/07/2013  DTaP Vaccine: 12/06/2013  Flu vaccine: 04/09/2014  Foot Exam: 10/18/2011  Pneumovax: 12/06/2013  Other Preventive Care Notes:  Dental visit- June 2015      Vital  Signs:  07/08/2014 10:19 AM (EST)Height 5 ft 6 in 07/08/2014 9:23 AM (EST)Blood Pressure 126 / 68 mm/HgBMI 50.7; Height 5 ft 6 in; Weight 314 lbs 07/08/2014 8:57 AM (EST)Height 5 ft 6 in 07/08/2014 8:57 AM (EST)Height 5 ft 6 in   Testing:  Blood Sugar Tests: Hemoglobin A1c: 7.2 resulted on 07/08/2014   Care Planning:  Learning Preference Assessment:  Learner: Patient   Readiness to Learn Barriers: Emotional, Fatigue, Financial  Teaching Method: Explanation  Evaluation of Learning: Needs review and assistance  Care Plan:  07/08/2014 10:19 AM (EST) (1)  Problem: Physical Inactivity  Role: Clinical Pharmacist  Long Term Goal Enter weight loss competition and start physical activity 3 days a week.  Date Started: 07/08/2014  Short Term Goal #1: Go to the employee gym once this week.  Date Started: 07/08/2014     Assessment/Plan:  Patient is a 48 year old female with DM2. A1C today was 7.2% and is slightly above goal of less than 7%. It is stable compared to last visit with me (7.1%). Weight is also stable since her last visit with me. I spent the majority of the visit discussing with patient her plans for weight loss. She struggles with physical activity and states that she does not enjoy it. She acknowledges that she has a lot to gain from physical activity but she has a hard time completing it. She is very excited about an upcoming weight loss challenge and I attempted to help her come up with a plan for physical activity during that challenge.  Patient has made some changes to her eating habits and states that she is still eating smaller portions. She is still interested in seeing an RD.  Helped patient claim her wellness rewards.  I will fax A1C results to Dr. Doug Sou. Patient has not tolerated metformin or GLP-1 agonists in the past. She may be a candidate for Januvia or another DPP-4 inhibitor.  Follow up with patient in 3 months.  Goals for Next Visit- 1. Make a plan for how you are going to compete in the weight loss challenge. Write it down. Email it to me. Things to think about- Where am I going to exercise? What type of exercise am I going to do? Try to anticipate some road blocks that have held you back from succeeding in the past. How are you going to get around those this time? Think about your motivations and the rewards that you will gain by  exercising (feel better, back won't hurt, blood sugar will improve, clothes will fit better, $ for pedicure). 2. This week, go exercise in the employee gym at least once. 3. Make an appointment to see a RD at the [facility] [phone]. 4. Wear your jawbone fitness tracker so that you can earn more badges under the wellness program. Next appointment to see me is Friday March 4th at 9 AM.

## 2014-07-29 NOTE — Progress Notes (Signed)
Patient ID: Denise Stevens, female   DOB: 1966/04/12, 48 y.o.   MRN: [id number] Reviewed: Agree with the documentation and management of our [place].

## 2014-08-06 ENCOUNTER — Other Ambulatory Visit (INDEPENDENT_AMBULATORY_CARE_PROVIDER_SITE_OTHER): Payer: 59

## 2014-08-06 ENCOUNTER — Ambulatory Visit (INDEPENDENT_AMBULATORY_CARE_PROVIDER_SITE_OTHER): Payer: 59 | Admitting: Internal Medicine

## 2014-08-06 ENCOUNTER — Encounter: Payer: Self-pay | Admitting: Internal Medicine

## 2014-08-06 VITALS — BP 128/78 | HR 91 | Temp 98.4°F | Resp 16 | Ht 65.0 in | Wt 318.0 lb

## 2014-08-06 DIAGNOSIS — I10 Essential (primary) hypertension: Secondary | ICD-10-CM

## 2014-08-06 DIAGNOSIS — R4589 Other symptoms and signs involving emotional state: Secondary | ICD-10-CM

## 2014-08-06 DIAGNOSIS — M76892 Other specified enthesopathies of left lower limb, excluding foot: Secondary | ICD-10-CM

## 2014-08-06 DIAGNOSIS — E119 Type 2 diabetes mellitus without complications: Secondary | ICD-10-CM

## 2014-08-06 DIAGNOSIS — G4733 Obstructive sleep apnea (adult) (pediatric): Secondary | ICD-10-CM

## 2014-08-06 DIAGNOSIS — F603 Borderline personality disorder: Secondary | ICD-10-CM

## 2014-08-06 LAB — LIPID PANEL
CHOLESTEROL: 161 mg/dL (ref 0–200)
HDL: 40.4 mg/dL (ref >39.00)
LDL CALC: 106 mg/dL — AB (ref 0–99)
NonHDL: 120.6
TRIGLYCERIDES: 74 mg/dL (ref 0.0–149.0)
Total CHOL/HDL Ratio: 4
VLDL: 14.8 mg/dL (ref 0.0–40.0)

## 2014-08-06 LAB — BASIC METABOLIC PANEL
BUN: 11 mg/dL (ref 6–23)
CALCIUM: 8.9 mg/dL (ref 8.4–10.5)
CO2: 29 meq/L (ref 19–32)
Chloride: 105 mEq/L (ref 96–112)
Creatinine, Ser: 0.8 mg/dL (ref 0.4–1.2)
GFR: 100.54 mL/min (ref >60.00)
GLUCOSE: 125 mg/dL — AB (ref 70–99)
POTASSIUM: 3.6 meq/L (ref 3.5–5.1)
SODIUM: 139 meq/L (ref 135–145)

## 2014-08-06 NOTE — Progress Notes (Signed)
Pre visit review using our clinic review tool, if applicable. No additional management support is needed unless otherwise documented below in the visit note. 

## 2014-08-06 NOTE — Assessment & Plan Note (Signed)
Will forward to Dr. Annamaria Boots that she is able to pursue the in home sleep study at this time to see if it can be arranged.

## 2014-08-06 NOTE — Assessment & Plan Note (Signed)
She is well controlled with valsartan/hctz.

## 2014-08-06 NOTE — Progress Notes (Signed)
   Subjective:    Patient ID: Denise Stevens, female    DOB: [birth year], 48 y.o.   MRN: [id number]  HPI The patient is a 48 YO female who is coming in today to establish care. She has PMH of diabetes mellitus type 2 without complications. She has been unable to tolerate metformin in the past due to GI side effects. She is going through a stressful time with her mom recently diagnosed with breast cancer. She is also having some stress with her siblings. She denies any problems with chest pains, SOB, abdominal pains. She was supposed to do a home sleep study but was unable due to life stressors. She was doing an exercise program but is no longer.   Review of Systems  Constitutional: Negative for fever, activity change, appetite change, fatigue and unexpected weight change.  HENT: Negative.   Respiratory: Negative for cough, chest tightness, shortness of breath and wheezing.   Cardiovascular: Negative for chest pain, palpitations and leg swelling.  Gastrointestinal: Negative for nausea, abdominal pain, diarrhea, constipation and abdominal distention.  Musculoskeletal: Positive for back pain and arthralgias. Negative for myalgias and gait problem.  Skin: Negative.   Neurological: Negative for dizziness, tremors, syncope, weakness, light-headedness and headaches.      Objective:   Physical Exam  Constitutional: She is oriented to person, place, and time. She appears well-developed and well-nourished.  Overweight  HENT:  Head: Normocephalic and atraumatic.  Eyes: EOM are normal.  Neck: Normal range of motion.  Cardiovascular: Normal rate and regular rhythm.   Pulmonary/Chest: Effort normal and breath sounds normal. No respiratory distress. She has no wheezes. She has no rales.  Abdominal: Soft. Bowel sounds are normal. She exhibits no distension. There is no tenderness. There is no rebound.  Neurological: She is alert and oriented to person, place, and time. Coordination normal.  Skin: Skin  is warm and dry.   Filed Vitals:   08/06/14 0915  BP: 128/78  Pulse: 91  Temp: 98.4 F (36.9 C)  TempSrc: Oral  Resp: 16  Height: 5\' 5"  (1.651 m)  Weight: 318 lb (144.244 kg)  SpO2: 96%      Assessment & Plan:  Foot exam performed.

## 2014-08-06 NOTE — Assessment & Plan Note (Signed)
Doing well with lexapro. Needs rx for xanax once per year and uses in extreme emotional situations.

## 2014-08-06 NOTE — Assessment & Plan Note (Signed)
She states that she had a bad experience with injection several years ago and is not ready to try again. This pain does not limit her walking but causes her pain and stiffness with sitting.

## 2014-08-06 NOTE — Assessment & Plan Note (Signed)
She is on ARB, spoke with her about the fact that her weight is likely contributing. Last HgA1c 7.2 and will check with her insurance. Would like to trial invokana or similar. Check BMP today as last about 1 year ago. Foot exam done today. See her back in 3 months for close follow up. Advised to exercise regularly and work on diet.

## 2014-08-06 NOTE — Patient Instructions (Signed)
Work on getting back into doing some exercise. We will work on getting the home sleep study arranged for you.  We will try you on a new medicine for your diabetes. We will check with your insurance to see which medicine will be covered and we will let you know.   Think about getting a colonoscopy to screen for colon cancer in the next year.   We will see you back in about 3 months to check on your diabetes and the new medicine. If you have any problems or questions before then please feel free to call us.  Colonoscopy A colonoscopy is an exam to look at the entire large intestine (colon). This exam can help find problems such as tumors, polyps, inflammation, and areas of bleeding. The exam takes about 1 hour.  LET [facility] CARE PROVIDER KNOW ABOUT:   Any allergies you have.  All medicines you are taking, including vitamins, herbs, eye drops, creams, and over-the-counter medicines.  Previous problems you or members of your family have had with the use of anesthetics.  Any blood disorders you have.  Previous surgeries you have had.  Medical conditions you have. RISKS AND COMPLICATIONS  Generally, this is a safe procedure. However, as with any procedure, complications can occur. Possible complications include:  Bleeding.  Tearing or rupture of the colon wall.  Reaction to medicines given during the exam.  Infection (rare). BEFORE THE PROCEDURE   Ask your health care provider about changing or stopping your regular medicines.  You may be prescribed an oral bowel prep. This involves drinking a large amount of medicated liquid, starting the day before your procedure. The liquid will cause you to have multiple loose stools until your stool is almost clear or light green. This cleans out your colon in preparation for the procedure.  Do not eat or drink anything else once you have started the bowel prep, unless your health care provider tells you it is safe to do so.  Arrange for  someone to drive you home after the procedure. PROCEDURE   You will be given medicine to help you relax (sedative).  You will lie on your side with your knees bent.  A long, flexible tube with a light and camera on the end (colonoscope) will be inserted through the rectum and into the colon. The camera sends video back to a computer screen as it moves through the colon. The colonoscope also releases carbon dioxide gas to inflate the colon. This helps your health care provider see the area better.  During the exam, your health care provider may take a small tissue sample (biopsy) to be examined under a microscope if any abnormalities are found.  The exam is finished when the entire colon has been viewed. AFTER THE PROCEDURE   Do not drive for 24 hours after the exam.  You may have a small amount of blood in your stool.  You may pass moderate amounts of gas and have mild abdominal cramping or bloating. This is caused by the gas used to inflate your colon during the exam.  Ask when your test results will be ready and how you will get your results. Make sure you get your test results. Document Released: 07/23/2000 Document Revised: 05/16/2013 Document Reviewed: 04/02/2013 [facility] Patient Information 2015 [place], [place]. This information is not intended to replace advice given to you by your health care provider. Make sure you discuss any questions you have with your health care provider.

## 2014-08-08 ENCOUNTER — Other Ambulatory Visit: Payer: Self-pay | Admitting: Internal Medicine

## 2014-08-08 MED ORDER — CANAGLIFLOZIN 100 MG PO TABS: 100.0000 mg | ORAL_TABLET | Freq: Every day | ORAL | Status: DC

## 2014-08-17 ENCOUNTER — Ambulatory Visit: Payer: 59

## 2014-08-29 ENCOUNTER — Encounter: Payer: Self-pay | Admitting: Internal Medicine

## 2014-09-04 ENCOUNTER — Other Ambulatory Visit: Payer: Self-pay | Admitting: Geriatric Medicine

## 2014-09-04 MED ORDER — BUPROPION HCL ER (XL) 150 MG PO TB24: 150.0000 mg | ORAL_TABLET | Freq: Every day | ORAL | Status: DC

## 2014-09-04 MED ORDER — EXENATIDE ER 2 MG ~~LOC~~ PEN: 2.0000 mg | PEN_INJECTOR | SUBCUTANEOUS | Status: DC

## 2014-09-04 MED ORDER — PANTOPRAZOLE SODIUM 40 MG PO TBEC: 40.0000 mg | DELAYED_RELEASE_TABLET | Freq: Every day | ORAL | Status: DC

## 2014-09-04 MED ORDER — VALSARTAN-HYDROCHLOROTHIAZIDE 160-25 MG PO TABS: 1.0000 | ORAL_TABLET | Freq: Every day | ORAL | Status: DC

## 2014-09-04 NOTE — Addendum Note (Signed)
Addended by: Vertell Novak A on: 09/04/2014 07:57 AM   Modules accepted: Orders, Medications

## 2014-09-10 ENCOUNTER — Other Ambulatory Visit: Payer: Self-pay | Admitting: Internal Medicine

## 2014-09-10 MED ORDER — METRONIDAZOLE 500 MG PO TABS: 500.0000 mg | ORAL_TABLET | Freq: Three times a day (TID) | ORAL | Status: DC

## 2014-10-18 ENCOUNTER — Encounter: Payer: Self-pay | Admitting: Internal Medicine

## 2014-11-11 ENCOUNTER — Encounter: Payer: Self-pay | Admitting: Internal Medicine

## 2014-11-11 ENCOUNTER — Ambulatory Visit (INDEPENDENT_AMBULATORY_CARE_PROVIDER_SITE_OTHER): Payer: 59 | Admitting: Internal Medicine

## 2014-11-11 ENCOUNTER — Other Ambulatory Visit (INDEPENDENT_AMBULATORY_CARE_PROVIDER_SITE_OTHER): Payer: 59

## 2014-11-11 VITALS — BP 124/78 | HR 91 | Temp 98.8°F | Resp 18 | Wt 318.4 lb

## 2014-11-11 DIAGNOSIS — E119 Type 2 diabetes mellitus without complications: Secondary | ICD-10-CM | POA: Diagnosis not present

## 2014-11-11 DIAGNOSIS — G4733 Obstructive sleep apnea (adult) (pediatric): Secondary | ICD-10-CM

## 2014-11-11 DIAGNOSIS — I10 Essential (primary) hypertension: Secondary | ICD-10-CM | POA: Diagnosis not present

## 2014-11-11 LAB — VITAMIN D 25 HYDROXY (VIT D DEFICIENCY, FRACTURES): VITD: 16.87 ng/mL — ABNORMAL LOW (ref 30.00–100.00)

## 2014-11-11 LAB — HEMOGLOBIN A1C: Hgb A1c MFr Bld: 6.7 % — ABNORMAL HIGH (ref 4.6–6.5)

## 2014-11-11 MED ORDER — METFORMIN HCL ER 500 MG PO TB24: 500.0000 mg | ORAL_TABLET | Freq: Every day | ORAL | Status: DC

## 2014-11-11 MED ORDER — PANTOPRAZOLE SODIUM 40 MG PO TBEC: 40.0000 mg | DELAYED_RELEASE_TABLET | Freq: Every day | ORAL | Status: DC

## 2014-11-11 MED ORDER — VALSARTAN-HYDROCHLOROTHIAZIDE 160-25 MG PO TABS: 1.0000 | ORAL_TABLET | Freq: Every day | ORAL | Status: DC

## 2014-11-11 MED ORDER — BUPROPION HCL ER (XL) 150 MG PO TB24: 150.0000 mg | ORAL_TABLET | Freq: Every day | ORAL | Status: DC

## 2014-11-11 MED ORDER — ALPRAZOLAM 0.25 MG PO TABS: 0.2500 mg | ORAL_TABLET | Freq: Three times a day (TID) | ORAL | Status: DC | PRN

## 2014-11-11 NOTE — Progress Notes (Signed)
   Subjective:    Patient ID: Denise Stevens, female    DOB: February 17, 1966, 49 y.o.   MRN: [id number]  HPI The patient is a 49 YO female who is coming in today to follow up on her diabetes. She did try to do invokana since last visit and this gave her severe urinary symptoms and she was forced to stop taking it. So we went back to bydureon which she was doing okay with but it gives her itching and lumps under her skin where she does the injection so she stopped that about 6 weeks ago. She is not exercising lately due to the stress of her mother's breast cancer. She has been craving chocolate and has been eating a little more candy. Also wants to know if we can do the home sleep study now that her job is more secure.   Review of Systems  Constitutional: Negative for fever, activity change, appetite change, fatigue and unexpected weight change.  HENT: Negative.   Respiratory: Negative for cough, chest tightness, shortness of breath and wheezing.   Cardiovascular: Negative for chest pain, palpitations and leg swelling.  Gastrointestinal: Negative for nausea, abdominal pain, diarrhea, constipation and abdominal distention.  Musculoskeletal: Positive for back pain and arthralgias. Negative for myalgias and gait problem.  Skin: Negative.   Neurological: Negative for dizziness, tremors, syncope, weakness, light-headedness and headaches.      Objective:   Physical Exam  Constitutional: She is oriented to person, place, and time. She appears well-developed and well-nourished.  Overweight  HENT:  Head: Normocephalic and atraumatic.  Eyes: EOM are normal.  Neck: Normal range of motion.  Cardiovascular: Normal rate and regular rhythm.   Pulmonary/Chest: Effort normal and breath sounds normal. No respiratory distress. She has no wheezes. She has no rales.  Abdominal: Soft. Bowel sounds are normal. She exhibits no distension. There is no tenderness. There is no rebound.  Neurological: She is alert and  oriented to person, place, and time. Coordination normal.  Skin: Skin is warm and dry.   Filed Vitals:   11/11/14 0852  BP: 124/78  Pulse: 91  Temp: 98.8 F (37.1 C)  TempSrc: Oral  Resp: 18  Weight: 318 lb 6.4 oz (144.425 kg)  SpO2: 97%      Assessment & Plan:

## 2014-11-11 NOTE — Assessment & Plan Note (Signed)
BP controlled today and continue her valsartan/hctz.

## 2014-11-11 NOTE — Assessment & Plan Note (Signed)
On ARB for renal protection, check HgA1c today. She has failed GLP-1 agonist, invokana since last visit. She does get significant side effects from metformin but would like to try the extended release form to see if this does better with her stomach. Will start with metformin xr 500 mg daily to see if she can tolerate. Could consider januvia if unable to tolerate. She has not been exercising and reminded her that this is very important for her health and her joints. Weight stable but not decreased.

## 2014-11-11 NOTE — Patient Instructions (Signed)
We will do the home sleep study so you should hear back about that.   We will check the vitamin D level and the HgA1c levels today.   Work on exercising at least 3-4 times per week. Every 1 pound that you lose takes 4 pounds of pressure off your knee.   Diabetes and Exercise Exercising regularly is important. It is not just about losing weight. It has many health benefits, such as:  Improving your overall fitness, flexibility, and endurance.  Increasing your bone density.  Helping with weight control.  Decreasing your body fat.  Increasing your muscle strength.  Reducing stress and tension.  Improving your overall health. People with diabetes who exercise gain additional benefits because exercise:  Reduces appetite.  Improves the body's use of blood sugar (glucose).  Helps lower or control blood glucose.  Decreases blood pressure.  Helps control blood lipids (such as cholesterol and triglycerides).  Improves the body's use of the hormone insulin by:  Increasing the body's insulin sensitivity.  Reducing the body's insulin needs.  Decreases the risk for heart disease because exercising:  Lowers cholesterol and triglycerides levels.  Increases the levels of good cholesterol (such as high-density lipoproteins [HDL]) in the body.  Lowers blood glucose levels. YOUR ACTIVITY PLAN  Choose an activity that you enjoy and set realistic goals. Your health care provider or diabetes educator can help you make an activity plan that works for you. Exercise regularly as directed by your health care provider. This includes:  Performing resistance training twice a week such as push-ups, sit-ups, lifting weights, or using resistance bands.  Performing 150 minutes of cardio exercises each week such as walking, running, or playing sports.  Staying active and spending no more than 90 minutes at one time being inactive. Even short bursts of exercise are good for you. Three 10-minute  sessions spread throughout the day are just as beneficial as a single 30-minute session. Some exercise ideas include:  Taking the dog for a walk.  Taking the stairs instead of the elevator.  Dancing to your favorite song.  Doing an exercise video.  Doing your favorite exercise with a friend. RECOMMENDATIONS FOR EXERCISING WITH TYPE 1 OR TYPE 2 DIABETES   Check your blood glucose before exercising. If blood glucose levels are greater than 240 mg/dL, check for urine ketones. Do not exercise if ketones are present.  Avoid injecting insulin into areas of the body that are going to be exercised. For example, avoid injecting insulin into:  The arms when playing tennis.  The legs when jogging.  Keep a record of:  Food intake before and after you exercise.  Expected peak times of insulin action.  Blood glucose levels before and after you exercise.  The type and amount of exercise you have done.  Review your records with your health care provider. Your health care provider will help you to develop guidelines for adjusting food intake and insulin amounts before and after exercising.  If you take insulin or oral hypoglycemic agents, watch for signs and symptoms of hypoglycemia. They include:  Dizziness.  Shaking.  Sweating.  Chills.  Confusion.  Drink plenty of water while you exercise to prevent dehydration or heat stroke. Body water is lost during exercise and must be replaced.  Talk to your health care provider before starting an exercise program to make sure it is safe for you. Remember, almost any type of activity is better than none. Document Released: 10/16/2003 Document Revised: 12/10/2013 Document Reviewed: 01/02/2013 [employer]  Patient Information 2015 [employer]. This information is not intended to replace advice given to you by your health care provider. Make sure you discuss any questions you have with your health care provider.  

## 2014-11-11 NOTE — Progress Notes (Signed)
Pre visit review using our clinic review tool, if applicable. No additional management support is needed unless otherwise documented below in the visit note. 

## 2014-11-11 NOTE — Assessment & Plan Note (Signed)
Needs new study to get CPAP mask and would like to do home study now that her life is a little more stable. Ordered today.

## 2014-11-18 ENCOUNTER — Encounter: Payer: Self-pay | Admitting: Internal Medicine

## 2014-11-18 MED ORDER — VITAMIN D3 125 MCG (5000 UT) PO CAPS: 5000.0000 [IU] | ORAL_CAPSULE | Freq: Every day | ORAL | Status: DC

## 2014-11-22 ENCOUNTER — Ambulatory Visit: Payer: 59 | Admitting: Pharmacist

## 2014-11-22 ENCOUNTER — Ambulatory Visit (INDEPENDENT_AMBULATORY_CARE_PROVIDER_SITE_OTHER): Payer: Self-pay | Admitting: Family Medicine

## 2014-11-22 VITALS — BP 138/80 | Ht 65.0 in | Wt 318.6 lb

## 2014-11-22 DIAGNOSIS — E119 Type 2 diabetes mellitus without complications: Secondary | ICD-10-CM

## 2014-11-22 NOTE — Patient Instructions (Signed)
Goals for Next Visit:  1. Make an appointment to see a RD at the [facility]. [phone]. 2. Continue the Time to Focus trial.  3. Make arrangements so that you can start water aerobics.  4. Get a combination lock for your locker and a pair of water shoes to wear at the pool.  5. Make it to one class before you come back and see me.      Next appointment to see me is: Friday July 22nd at 4 PM.

## 2014-11-22 NOTE — Progress Notes (Signed)
Subjective:  Patient presents today for 3 month diabetes follow-up as part of the employer-sponsored Link to Wellness program.  Current diabetes regimen includes metformin 250 mg once daily (set to increase to 500 mg daily next week). Patient also continues on daily ARB and statin.  No major health changes at this time.   Last visit with Dr. Doug Sou was a few weeks ago. Last A1C was 6.7% (down from 7.2%). Patient thinks that the Bydureon helped to bring A1C down. She has since stopped taking Bydureon because of hard, itchy knots she developed at the injection site. She still has 2 knots present; last dose was about 4 weeks ago.    She is still enrolled in the Time to Focus diabetes trial. She states that it has been going well. She has found the education to be useful.   Since her last visit with me patient has been taking care of her elderly mother who has breast cancer. This has been stressful for her.   Assessment/Plan:  Patient is a 49  yo female with DM 1/2. Most recent A1C was  6.7 % which is at goal/exceeding goal of less than 7%. Weight is increased from last visit with me.   Mood has improved since her last visit with me. She is engaged to be married and seems much happier than she has in previous months and years.   CBG Review: She states that when she was taking Bydureon CBG was dropping to 65-70. She is checking once a week now and she reports CBG is running around 100.   She is no longer taking Bydureon but has restarted metformin 250 mg daily. She reports some stomach upset but she wants to keep taking it. I encouraged patient to continue and explained that Gi side effects usually resolve within the first few weeks of taking metformin.   Lifestyle improvements:  Physical Activity-  No consistent physical activity. She was wearing a jawbone fitness tracker previously but isn't wearing it anymore.   She states she is interested in water aerobics and going with her fiancee. He  has offered to go with her and is encouraging her to be more physically active and healthy.   Nutrition-  She states she is eating more salads lately. She also reports that her appetite has decreased. At lunch she cannot eat everything on her plate. Since she has completed the education through the Time to Focus trial she has been paying more attention to carbohydrates and aiming for 45-60 grams of carbohydrates.    Follow up with me in 3 months.    Goals for Next Visit:  1. Make an appointment to see a RD at the [facility]. [phone]. 2. Continue the Time to Focus trial.  3. Make arrangements so that you can start water aerobics.  4. Get a combination lock for your locker and a pair of water shoes to wear at the pool.  5. Make it to one class before you come back and see me.    [place]        Office Visit from 11/22/2014 in [place] Problem One  Physical Inactivity   Care Plan for Problem One  Active   Interventions for Problem One Long Term Goal  Discussed with patient barriers to starting water aerobics and how to eliminate them. Patient has a swimsuit already but wants water shoes and will need a combination lock. Provided patient with the schedule  for the [facility] water aerobics and how to sign up for classes.    [facility] Long Term Goal (31-90 days)  Start water aerobics at the [facility] Term Goal Start Date  11/22/14      Next appointment to see me is: Friday July 22nd at 4 PM.    Jinny Blossom D. Donneta Romberg, PharmD, BCPS, CDE [facility] [place] Coordinator [phone]

## 2014-11-29 NOTE — Progress Notes (Signed)
Patient ID: Denise Stevens, female   DOB: Apr 22, 1966, 49 y.o.   MRN: [id number] ATTENDING PHYSICIAN NOTE: I have reviewed the chart and agree with the plan as detailed above. Dorcas Mcmurray MD Pager [phone]

## 2014-12-12 ENCOUNTER — Other Ambulatory Visit (INDEPENDENT_AMBULATORY_CARE_PROVIDER_SITE_OTHER): Payer: 59

## 2014-12-12 ENCOUNTER — Ambulatory Visit (INDEPENDENT_AMBULATORY_CARE_PROVIDER_SITE_OTHER): Payer: 59 | Admitting: Internal Medicine

## 2014-12-12 ENCOUNTER — Encounter: Payer: Self-pay | Admitting: Internal Medicine

## 2014-12-12 VITALS — BP 140/72 | HR 96 | Temp 98.8°F | Resp 16 | Wt 318.0 lb

## 2014-12-12 DIAGNOSIS — R0789 Other chest pain: Secondary | ICD-10-CM

## 2014-12-12 DIAGNOSIS — G5602 Carpal tunnel syndrome, left upper limb: Secondary | ICD-10-CM | POA: Diagnosis not present

## 2014-12-12 LAB — FOLATE: FOLATE: 13.2 ng/mL (ref >5.9)

## 2014-12-12 LAB — VITAMIN B12: VITAMIN B 12: 316 pg/mL (ref 211–911)

## 2014-12-12 LAB — TSH: TSH: 0.85 u[IU]/mL (ref 0.35–4.50)

## 2014-12-12 NOTE — Patient Instructions (Signed)
Your EKG looks normal but we will still send you back to cardiology for a stress test. The pains in the chest are not typical for the heart.   Get a brace for the wrist and wear it at night time for the numbness in the hands.   Stress can affect the health and the sleep.   The sleep study has been sent and you should hear from [facility] in pulmonology but they are backed up with scheduling.

## 2014-12-12 NOTE — Progress Notes (Signed)
Pre visit review using our clinic review tool, if applicable. No additional management support is needed unless otherwise documented below in the visit note. 

## 2014-12-15 DIAGNOSIS — G5602 Carpal tunnel syndrome, left upper limb: Secondary | ICD-10-CM | POA: Insufficient documentation

## 2014-12-15 NOTE — Progress Notes (Signed)
   Subjective:    Patient ID: Denise Stevens, female    DOB: 1966/06/13, 49 y.o.   MRN: [id number]  HPI The patient is a 49 YO female who is coming in for two new complaints. She is having left hand numbness throughout the day. 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 Not in the upper arm. Goes away with manipulation of the hands. Denies association with other symptoms.  The other problem is some chest pains. Right side of the chest. Lasts a few seconds. She is very stressed right now with her mother going through breast cancer therapy and she is planning her wedding. Happened about 2-3 times. Has had normal cath in the past (many years ago now). Denies SOB or diaphoresis or nausea or jaw pain in association with the episodes.   Review of Systems  Constitutional: Negative for fever, activity change, appetite change, fatigue and unexpected weight change.  HENT: Negative.   Respiratory: Negative for cough, chest tightness, shortness of breath and wheezing.   Cardiovascular: Positive for chest pain. Negative for palpitations and leg swelling.  Gastrointestinal: Negative for nausea, abdominal pain, diarrhea, constipation and abdominal distention.  Musculoskeletal: Positive for back pain and arthralgias. Negative for myalgias and gait problem.  Skin: Negative.   Neurological: Positive for numbness. Negative for dizziness, tremors, syncope, weakness, light-headedness and headaches.      Objective:   Physical Exam  Constitutional: She is oriented to person, place, and time. She appears well-developed and well-nourished.  Overweight  HENT:  Head: Normocephalic and atraumatic.  Eyes: EOM are normal.  Neck: Normal range of motion.  Cardiovascular: Normal rate and regular rhythm.   No murmur heard. Pulmonary/Chest: Effort normal and breath sounds normal. No respiratory distress. She has no wheezes. She has no rales.  Abdominal: Soft. Bowel sounds are normal. She exhibits no distension. There is no  tenderness. There is no rebound.  Musculoskeletal: She exhibits no edema.  Tinnel's sign positive  Neurological: She is alert and oriented to person, place, and time. Coordination normal.  Skin: Skin is warm and dry.   Filed Vitals:   12/12/14 1505  BP: 140/72  Pulse: 96  Temp: 98.8 F (37.1 C)  TempSrc: Oral  Resp: 16  Weight: 318 lb (144.244 kg)  SpO2: 97%   EKG: rate 87, axis normal, intervals normal, regular sinus, no st or t wave changes, no change from prior.     Assessment & Plan:

## 2014-12-15 NOTE — Assessment & Plan Note (Addendum)
Relatively infrequent at this time. Trial night time splinting to see if this reduces symptoms. If no relief or worsening of symptoms refer to orthopedics for possible injections. Check B12 and folate to ensure no nutritional deficiency that we are missing.

## 2014-12-15 NOTE — Assessment & Plan Note (Addendum)
EKG without changes, likely related to stress but she does have other conditions that make her high risk for cardiac. Refer to cardiology for consideration of stress testing. If she has more episodes or worsening or chest pain that does not resolve she will seek care sooner.

## 2014-12-18 LAB — HM DIABETES EYE EXAM

## 2014-12-20 ENCOUNTER — Other Ambulatory Visit: Payer: Self-pay | Admitting: Geriatric Medicine

## 2014-12-20 MED ORDER — PRAVASTATIN SODIUM 40 MG PO TABS: 40.0000 mg | ORAL_TABLET | Freq: Every day | ORAL | Status: DC

## 2014-12-25 ENCOUNTER — Other Ambulatory Visit: Payer: Self-pay

## 2014-12-25 DIAGNOSIS — Z1231 Encounter for screening mammogram for malignant neoplasm of breast: Secondary | ICD-10-CM

## 2014-12-26 ENCOUNTER — Telehealth: Payer: Self-pay | Admitting: Internal Medicine

## 2014-12-26 NOTE — Telephone Encounter (Signed)
Rec'd from [facility] forward 5 pages to Dr. Herbert Deaner

## 2014-12-30 DIAGNOSIS — G4730 Sleep apnea, unspecified: Secondary | ICD-10-CM | POA: Diagnosis not present

## 2015-01-03 ENCOUNTER — Encounter: Payer: Self-pay | Admitting: Pharmacist

## 2015-01-07 ENCOUNTER — Other Ambulatory Visit: Payer: Self-pay | Admitting: *Deleted

## 2015-01-07 ENCOUNTER — Encounter: Payer: Self-pay | Admitting: Geriatric Medicine

## 2015-01-07 DIAGNOSIS — G4733 Obstructive sleep apnea (adult) (pediatric): Secondary | ICD-10-CM

## 2015-01-08 DIAGNOSIS — G4730 Sleep apnea, unspecified: Secondary | ICD-10-CM | POA: Diagnosis not present

## 2015-01-28 ENCOUNTER — Ambulatory Visit: Payer: 59

## 2015-01-30 ENCOUNTER — Other Ambulatory Visit: Payer: Self-pay | Admitting: Internal Medicine

## 2015-01-30 MED ORDER — METAXALONE 800 MG PO TABS: 800.0000 mg | ORAL_TABLET | Freq: Three times a day (TID) | ORAL | Status: DC | PRN

## 2015-01-30 MED ORDER — HYDROCODONE-ACETAMINOPHEN 5-325 MG PO TABS: 1.0000 | ORAL_TABLET | Freq: Four times a day (QID) | ORAL | Status: DC | PRN

## 2015-01-30 NOTE — Progress Notes (Signed)
Agree with plan, thanks for the assistance!

## 2015-01-30 NOTE — Progress Notes (Signed)
Madai called me instead of PCP bc I was at my desk early today. In usual state of health yesterday except ate cesear salad which freq causes GI upset. Went home and had urgent BM and D. When getting up off toilet had sudden severe onset sharp pain. R side right where bra strap would lie. Goes around to side. Severe even at rest, worse with movement. No sleep last night bc even breathing makes it worse. There is a knot there where the pain is located. Never had this pain before. No RF for bony met, compression fx, traumatic fx, PE, other life threatening cause. Has been on flexeril before (made sleepy) and skelaxin (did not make sleepy).   ASSESSMENT : presumed pulled muscle  PLAN Will Rx skelaxin.  Also few hydrocodone in case the skelaxin is not enough.  Goal is to return to work 24th as no vacation days left Provided home # and pager for issues Will need appt if no better CC to PCP Dr Doug Sou

## 2015-02-03 ENCOUNTER — Other Ambulatory Visit: Payer: Self-pay

## 2015-02-06 ENCOUNTER — Encounter: Payer: Self-pay | Admitting: Internal Medicine

## 2015-02-12 ENCOUNTER — Encounter: Payer: Self-pay | Admitting: Internal Medicine

## 2015-02-13 ENCOUNTER — Other Ambulatory Visit: Payer: Self-pay | Admitting: Gastroenterology

## 2015-02-26 ENCOUNTER — Ambulatory Visit: Payer: 59

## 2015-02-27 ENCOUNTER — Other Ambulatory Visit: Payer: Self-pay | Admitting: Geriatric Medicine

## 2015-02-27 ENCOUNTER — Ambulatory Visit
Admission: RE | Admit: 2015-02-27 | Discharge: 2015-02-27 | Disposition: A | Discharge status: Home / Self Care | Payer: 59 | Source: Ambulatory Visit

## 2015-02-27 DIAGNOSIS — Z1231 Encounter for screening mammogram for malignant neoplasm of breast: Secondary | ICD-10-CM

## 2015-02-27 MED ORDER — METFORMIN HCL ER 500 MG PO TB24: 500.0000 mg | ORAL_TABLET | Freq: Every day | ORAL | Status: DC

## 2015-02-28 ENCOUNTER — Ambulatory Visit (INDEPENDENT_AMBULATORY_CARE_PROVIDER_SITE_OTHER): Payer: Self-pay | Admitting: Family Medicine

## 2015-02-28 ENCOUNTER — Ambulatory Visit: Payer: Self-pay | Admitting: Pharmacist

## 2015-02-28 VITALS — BP 112/72 | Ht 65.5 in | Wt 321.8 lb

## 2015-02-28 DIAGNOSIS — E119 Type 2 diabetes mellitus without complications: Secondary | ICD-10-CM

## 2015-02-28 NOTE — Progress Notes (Signed)
Subjective:  Patient presents today for 3 month diabetes follow-up as part of the employer-sponsored Link to Wellness program.  Current diabetes regimen includes metformin 250 mg once daily at bedtime.  Patient also continues on daily ARB and statin.  Most recent MD follow-up was in May with Dr. Doug Sou. Patient has a pending appt for August with Dr. Doug Sou. No major health changes at this time.   2 new medications- hydrocodone and skelaxin were for back pain. She has not taken either recently.    Assessment/Plan:  Patient is a 49 y.o. female with DM 2. Most recent A1C was 6.7% which is at goal of less than 7%. Weight is increased from last visit with me.   She has started metformin again and states that she is tolerating it so-so. She is having daily diarrhea but states it is not as explosive as it was previously.   CBG Review: She is not currently checking CBGs. Last checks were about 5-6 weeks ago.   Lifestyle improvements:  Physical Activity-  None currently. One of her previous goals was to take a water aerobics class. She says she has the schedule but that she never went to a class.    Nutrition-  One of her last goals at our previous appointment was to see a dietitian so she could claim the Healthy Weight badge. She states she has the contact information for the RD but did not call to make the appointment stating that it is difficult for her to take PAL at this time.   She states that she knows how to count carbohydrates but that she just doesn't do it when she eats.    I spent a large part of the visit discussing with patient her reasons for joining the Link to Wellness program and what she hoped to accomplish while in the program. She states that when she gets home in the evening she just wants to sit on the couch and watch TV or play on the ipad. She doesn't want her A1C to increase but she is not willing to put in the effort to make changes to her lifestyle. She does not want to  sweat and is adamant about this.   Patient states she thinks she should start exercise of some kind and that she thinks it would help improve her energy and it would help improve the pain she experiences in her hips, knees and back. I encouraged her to start walking at the mall immediately after she got off work. She states that she thinks she could do it.   I reassured her that she was worth it to be in good health and it would be worthwhile to make these changes.     Patient requested that she meet with me in 1 month to follow up on her goals.    Goals for Next Visit:  1. Talk to Dr. Doug Sou about changing metformin to something else for diabetes at your visit in August.  2. Start checking your blood sugar first thing in the morning. Aim for once a week on Mondays. 3. Start physical activity twice a week on Tuesdays and Thursdays after work. Ideas- keep your sneakers in the car and head straight to the mall after work. Walk for 15 minutes.     Next appointment to see me is: Friday August 26th at 8:30 AM.    Truett Mainland. Donneta Romberg, PharmD, BCPS, CDE [facility] [place] Coordinator [phone]

## 2015-03-04 NOTE — Progress Notes (Signed)
ATTENDING PHYSICIAN NOTE: I have reviewed the chart and agree with the plan as detailed above. Michaell Grider MD Pager [phone]  

## 2015-03-13 ENCOUNTER — Ambulatory Visit: Payer: 59 | Admitting: Internal Medicine

## 2015-03-21 ENCOUNTER — Ambulatory Visit: Payer: 59 | Admitting: Internal Medicine

## 2015-03-31 ENCOUNTER — Ambulatory Visit (INDEPENDENT_AMBULATORY_CARE_PROVIDER_SITE_OTHER): Payer: 59 | Admitting: Internal Medicine

## 2015-03-31 ENCOUNTER — Encounter: Payer: Self-pay | Admitting: Internal Medicine

## 2015-03-31 DIAGNOSIS — E119 Type 2 diabetes mellitus without complications: Secondary | ICD-10-CM | POA: Diagnosis not present
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
## 2015-03-31 NOTE — Assessment & Plan Note (Signed)
Talked to her about the need for exercise, agreed goal of down 6 pounds by follow up in 3 months. She will go to a gym and work on food intake. This is complicated by hypertension, mild OSA, DM type 2 controlled uncomplicated.

## 2015-03-31 NOTE — Progress Notes (Signed)
Pre visit review using our clinic review tool, if applicable. No additional management support is needed unless otherwise documented below in the visit note. 

## 2015-03-31 NOTE — Patient Instructions (Signed)
Keep working on the exercise to get healthy.

## 2015-03-31 NOTE — Assessment & Plan Note (Signed)
Taking 1/2 pill metformin XR daily and cannot uptitrate due to GI symptoms. Last HgA1c 6.7 and will check again at next visit.

## 2015-04-04 ENCOUNTER — Ambulatory Visit: Payer: 59 | Admitting: Pharmacist

## 2015-04-11 ENCOUNTER — Encounter: Payer: Self-pay | Admitting: Pharmacist

## 2015-04-18 ENCOUNTER — Encounter (HOSPITAL_COMMUNITY): Payer: Self-pay | Admitting: *Deleted

## 2015-04-28 NOTE — Anesthesia Preprocedure Evaluation (Addendum)
Anesthesia Evaluation  Patient identified by MRN, date of birth, ID band Patient awake    Reviewed: Allergy & Precautions, H&P , NPO status , Patient's Chart, lab work & pertinent test results  Airway Mallampati: III  TM Distance: >3 FB Neck ROM: full    Dental no notable dental hx. (+) Dental Advisory Given, Teeth Intact   Pulmonary asthma , sleep apnea ,    Pulmonary exam normal breath sounds clear to auscultation       Cardiovascular hypertension, Pt. on medications Normal cardiovascular exam Rhythm:regular Rate:Normal  Atypical CP   Neuro/Psych negative neurological ROS  negative psych ROS   GI/Hepatic negative GI ROS, Neg liver ROS,   Endo/Other  diabetes, Well Controlled, Type 2, Oral Hypoglycemic AgentsMorbid obesity  Renal/GU negative Renal ROS  negative genitourinary   Musculoskeletal   Abdominal (+) + obese,   Peds  Hematology negative hematology ROS (+)   Anesthesia Other Findings   Reproductive/Obstetrics negative OB ROS                          Anesthesia Physical Anesthesia Plan  ASA: IV  Anesthesia Plan: MAC   Post-op Pain Management:    Induction:   Airway Management Planned:   Additional Equipment:   Intra-op Plan:   Post-operative Plan:   Informed Consent: I have reviewed the patients History and Physical, chart, labs and discussed the procedure including the risks, benefits and alternatives for the proposed anesthesia with the patient or authorized representative who has indicated his/her understanding and acceptance.   Dental Advisory Given  Plan Discussed with: CRNA and Surgeon  Anesthesia Plan Comments:        Anesthesia Quick Evaluation

## 2015-04-29 ENCOUNTER — Ambulatory Visit (HOSPITAL_COMMUNITY)
Admission: RE | Admit: 2015-04-29 | Discharge: 2015-04-29 | Disposition: A | Discharge status: Home / Self Care | Payer: 59 | Source: Ambulatory Visit | Attending: Gastroenterology | Admitting: Gastroenterology

## 2015-04-29 ENCOUNTER — Encounter (HOSPITAL_COMMUNITY): Payer: Self-pay | Admitting: *Deleted

## 2015-04-29 ENCOUNTER — Ambulatory Visit (HOSPITAL_COMMUNITY): Payer: 59 | Admitting: Anesthesiology

## 2015-04-29 ENCOUNTER — Encounter (HOSPITAL_COMMUNITY)
Admission: RE | Disposition: A | Discharge status: Home / Self Care | Payer: Self-pay | Source: Ambulatory Visit | Attending: Gastroenterology

## 2015-04-29 DIAGNOSIS — I10 Essential (primary) hypertension: Secondary | ICD-10-CM | POA: Insufficient documentation

## 2015-04-29 DIAGNOSIS — K219 Gastro-esophageal reflux disease without esophagitis: Secondary | ICD-10-CM | POA: Diagnosis not present

## 2015-04-29 DIAGNOSIS — Z7951 Long term (current) use of inhaled steroids: Secondary | ICD-10-CM | POA: Diagnosis not present

## 2015-04-29 DIAGNOSIS — J45909 Unspecified asthma, uncomplicated: Secondary | ICD-10-CM | POA: Diagnosis not present

## 2015-04-29 DIAGNOSIS — K648 Other hemorrhoids: Secondary | ICD-10-CM | POA: Insufficient documentation

## 2015-04-29 DIAGNOSIS — D175 Benign lipomatous neoplasm of intra-abdominal organs: Secondary | ICD-10-CM | POA: Diagnosis not present

## 2015-04-29 DIAGNOSIS — G4733 Obstructive sleep apnea (adult) (pediatric): Secondary | ICD-10-CM | POA: Insufficient documentation

## 2015-04-29 DIAGNOSIS — K5730 Diverticulosis of large intestine without perforation or abscess without bleeding: Secondary | ICD-10-CM | POA: Insufficient documentation

## 2015-04-29 DIAGNOSIS — E119 Type 2 diabetes mellitus without complications: Secondary | ICD-10-CM | POA: Diagnosis not present

## 2015-04-29 DIAGNOSIS — Z79899 Other long term (current) drug therapy: Secondary | ICD-10-CM | POA: Diagnosis not present

## 2015-04-29 DIAGNOSIS — Z6841 Body Mass Index (BMI) 40.0 and over, adult: Secondary | ICD-10-CM | POA: Diagnosis not present

## 2015-04-29 DIAGNOSIS — F329 Major depressive disorder, single episode, unspecified: Secondary | ICD-10-CM | POA: Diagnosis not present

## 2015-04-29 DIAGNOSIS — E785 Hyperlipidemia, unspecified: Secondary | ICD-10-CM | POA: Insufficient documentation

## 2015-04-29 DIAGNOSIS — Z1211 Encounter for screening for malignant neoplasm of colon: Secondary | ICD-10-CM | POA: Diagnosis not present

## 2015-04-29 HISTORY — PX: COLONOSCOPY WITH PROPOFOL: SHX5780

## 2015-04-29 LAB — GLUCOSE, CAPILLARY: Glucose-Capillary: 130 mg/dL — ABNORMAL HIGH (ref 65–99)

## 2015-04-29 SURGERY — COLONOSCOPY WITH PROPOFOL
Anesthesia: Monitor Anesthesia Care

## 2015-04-29 MED ORDER — PROPOFOL 10 MG/ML IV BOLUS
INTRAVENOUS | Status: AC
  Filled 2015-04-29: qty 20

## 2015-04-29 MED ORDER — PROPOFOL INFUSION 10 MG/ML OPTIME
INTRAVENOUS | Status: DC | PRN
  Administered 2015-04-29: 200 ug/kg/min via INTRAVENOUS

## 2015-04-29 MED ORDER — LACTATED RINGERS IV SOLN
INTRAVENOUS | Status: DC
  Administered 2015-04-29: 1000 mL via INTRAVENOUS

## 2015-04-29 MED ORDER — SODIUM CHLORIDE 0.9 % IV SOLN: INTRAVENOUS | Status: DC

## 2015-04-29 SURGICAL SUPPLY — 22 items

## 2015-04-29 NOTE — Transfer of Care (Signed)
Immediate Anesthesia Transfer of Care Note  Patient: Denise Stevens  Procedure(s) Performed: Procedure(s): COLONOSCOPY WITH PROPOFOL (N/A)  Patient Location: PACU  Anesthesia Type:MAC  Level of Consciousness: awake, alert , oriented and patient cooperative  Airway & Oxygen Therapy: Patient Spontanous Breathing and Patient connected to face mask oxygen  Post-op Assessment: Report given to RN, Post -op Vital signs reviewed and stable and Patient moving all extremities X 4  Post vital signs: stable  Last Vitals:  Filed Vitals:   04/29/15 0654  BP: 150/89  Temp: 36.8 C  Resp: 16    Complications: No apparent anesthesia complications

## 2015-04-29 NOTE — H&P (Addendum)
Denise Stevens is an 49 y.o. female.   Chief Complaint: Colorectal cancer screening. HPI: 49 year old black female here for colon cancer screening. See office notes for details.  Past Medical History  Diagnosis Date  . HTN (hypertension)   . Hyperlipidemia   . Depression   . Asthma     no intubation or hospitalization hx  . GERD (gastroesophageal reflux disease)   . Diabetes mellitus        Morbid obesity  Past Surgical History  Procedure Laterality Date  . Vesicovaginal fistula closure w/ partial hysterectomy-done robotically      nov 07  . Rotator cuff repair      R 2000  . Ganglion cyst excision      R wrist 2000  . Marsupialization urethral diverticulum      2012  . Abdominal hysterectomy     Family History  Problem Relation Age of Onset  . Hyperlipidemia Mother   . Hypertension Mother   . Kidney disease Mother   . Diabetes Mother   . Cancer Father     prostate  . Heart attack Neg Hx    Social History:  reports that she has never smoked. She does not have any smokeless tobacco history on file. She reports that she drinks alcohol. She reports that she does not use illicit drugs.  Allergies:  Allergies  Allergen Reactions  . Codeine Itching  . Morphine And Related Itching    Medications Prior to Admission  Medication Sig Dispense Refill  . buPROPion (WELLBUTRIN XL) 150 MG 24 hr tablet Take 1 tablet (150 mg total) by mouth daily. 90 tablet 3  . Cholecalciferol (VITAMIN D3) 5000 UNITS CAPS Take 5,000 Units by mouth daily. 90 capsule 3  . cycloSPORINE (RESTASIS) 0.05 % ophthalmic emulsion Place 1 drop into both eyes 2 (two) times daily as needed (dryness).     Marland Kitchen escitalopram (LEXAPRO) 10 MG tablet Take 10 mg by mouth every morning.     . Fiber, Guar Gum, CHEW Chew 10 mg by mouth every morning.    . metFORMIN (GLUCOPHAGE XR) 500 MG 24 hr tablet Take 1 tablet (500 mg total) by mouth daily with breakfast. (Patient taking differently: Take 250 mg by mouth at bedtime. )  90 tablet 3  . pantoprazole (PROTONIX) 40 MG tablet Take 1 tablet (40 mg total) by mouth daily. 90 tablet 3  . polyvinyl alcohol (LIQUIFILM TEARS) 1.4 % ophthalmic solution Place 1 drop into both eyes daily as needed for dry eyes.    . pravastatin (PRAVACHOL) 40 MG tablet Take 1 tablet (40 mg total) by mouth daily. 30 tablet 3  . valsartan-hydrochlorothiazide (DIOVAN-HCT) 160-25 MG per tablet Take 1 tablet by mouth daily. 90 tablet 3  . ALPRAZolam (XANAX) 0.25 MG tablet Take 1 tablet (0.25 mg total) by mouth 3 (three) times daily as needed for anxiety. 270 tablet 0  . cetirizine (ZYRTEC) 10 MG tablet Take 10 mg by mouth at bedtime.    . fluticasone (FLONASE) 50 MCG/ACT nasal spray Place 2 sprays into both nostrils daily as needed for allergies or rhinitis.    . naproxen sodium (ANAPROX) 220 MG tablet Take 440 mg by mouth 2 (two) times daily as needed (joint pain.).     Review of Systems  Constitutional: Negative.   HENT: Negative.   Eyes: Negative.   Respiratory: Negative.   Cardiovascular: Negative.   Gastrointestinal: Positive for heartburn and diarrhea. Negative for nausea, vomiting, abdominal pain, constipation, blood in  stool and melena.  Genitourinary: Negative.   Musculoskeletal: Negative.   Skin: Negative.   Neurological: Negative.   Endo/Heme/Allergies: Negative.   Psychiatric/Behavioral: Negative.    Blood pressure 150/89, temperature 98.2 F (36.8 C), temperature source Oral, resp. rate 16, height 5' 5.5" (1.664 m), weight 147.419 kg (325 lb), SpO2 99 %. Physical Exam  Constitutional: She is oriented to person, place, and time. She appears well-developed and well-nourished.  Morbidly obese  HENT:  Head: Normocephalic and atraumatic.  Eyes: Conjunctivae and EOM are normal. Pupils are equal, round, and reactive to light.  Neck: Normal range of motion. Neck supple.  Cardiovascular: Normal rate and regular rhythm.   Respiratory: Effort normal and breath sounds normal.  GI:  Soft. Bowel sounds are normal. She exhibits no distension and no mass. There is no tenderness. There is no rebound and no guarding.  Musculoskeletal: Normal range of motion.  Neurological: She is alert and oriented to person, place, and time.  Skin: Skin is warm and dry.  Psychiatric: She has a normal mood and affect. Her behavior is normal. Thought content normal.    Assessment/Plan Colorectal cancer screening-morbid obesity with OSA: proceed with a colonoscopy at this time.  MANN,JYOTHI 04/29/2015, 7:13 AM

## 2015-04-29 NOTE — Op Note (Signed)
[facility] [place], [id number]   OPERATIVE PROCEDURE REPORT  PATIENT: Denise Stevens, Denise Stevens  MR#: [id number] BIRTHDATE: 17-Mar-1966 GENDER: female ENDOSCOPIST: Edmonia James, MD ASSISTANT:  Cletis Athens, technician & Salli Real, RN. PROCEDURE DATE: 2015-05-15 PRE-PROCEDURE PREPARATION: Patient fasted for 4 hours prior to procedure. The patient was prepped with a gallon of Golytely the night prior to the procedure. PRE-PROCEDURE PHYSICAL: Patient has stable vital signs.  Neck is supple.  There is no JVD, thyromegaly or LAD.  Chest clear to auscultation.  S1 and S2 regular.  Abdomen soft, morbidly obese, non-distended, non-tender with NABS. PROCEDURE:     Colonoscopy with random biopsies. ASA CLASS:     Class IV INDICATIONS:     1.  Colorectal cancer screening-average risk patient for colon cancer. MEDICATIONS:     Monitored anesthesia care  DESCRIPTION OF PROCEDURE: After the risks, benefits, and alternatives of the procedure were thoroughly explained [including a 10% missed rate of cancer and polyps], informed consent was obtained. Digital rectal exam was performed. The Pentax adult Colonscope [id number]  was introduced through the anus  and advanced to the terminal ileum which was intubated for a short distance , limited by No adverse events experienced.  The quality of the prep was adequate. Multiple washes were done. Small lesions could be missed. The instrument was then slowly withdrawn as the colon was fully examined. Estimated blood loss is zero unless otherwise noted in this procedure report.     COLON FINDINGS: There was a single diverticulum noted in the sigmoid coplon. 2 large lipomas were noted in the proximal ascending colon. The examined terminal ileum appeared to be normal. The entire colonic mucosa appeared healthy with a normal vascular pattern.  No masses, polyps or AVMs were noted. Random biopsies were done to rule out  microscopic colitis. The appendiceal orifice and the ICV were identified and photographed. Retroflexed views revealed small internal hemorrhoids. The patient tolerated the procedure without immediate complications.  The scope was then withdrawn from the patient and the procedure terminated.   TIME TO CECUM:   7 minutes 00 seconds WITHDRAW TIME:  9 minutes 00 seconds  IMPRESSION:     1.  One isolated diverticulum in the sigmoid colon. 2.  Two large lipomas in the proximal ascending colon. 3. Normal terminal ilerum. 4. Small internal hemorrhoids. 5. Random biopsies done to rule out microscopic colitis.  RECOMMENDATIONS:     1.  Hold Aspirin and all other NSAIDS for 2 weeks. 2.  Continue current medications. 3.  High fiber, low fat diet with liberal fluid intake. 4.  OP follow-up is advised on a PRN basis.  REPEAT EXAM:      In 10 years  for a repeat colonoscopy. If the patient has any abnormal GI symptoms in the interim, she have been advised to contact the office as soon as possible for further recommendations.   REFERRED VH:QIONGEXBM Lynnae January, M.D. eSigned:  Edmonia James, MD 05-15-15 8:05 AM  CPT CODES:     [phone] Colonoscopy, flexible, proximal to splenic flexure; with biopsy, single or multiple ICD CODES:     Z12.11 Encounter for screening for malignant neoplasm of colon, R19.4 Change in bowel habits-diarrhea.  The ICD and CPT codes recommended by this software are interpretations from the data that the clinical staff has captured with the software.  The verification of the translation of this report to the ICD and CPT codes and modifiers is the sole responsibility of the  health care institution and practicing physician where this report was generated.  [employer] will not be held responsible for the validity of the ICD and CPT codes included on this report.  AMA assumes no liability for data contained or not contained herein. CPT is a  Designer, television/film set of the [employer].  PATIENT NAME:  Denise Stevens, Denise Stevens MR#: [id number]

## 2015-04-29 NOTE — Discharge Instructions (Signed)
Colonoscopy °A colonoscopy is an exam to look at your colon. This exam can help find lumps (tumors), growths (polyps), bleeding, and redness and puffiness (inflammation) in your colon.  °BEFORE THE PROCEDURE °· Ask your doctor about changing or stopping your regular medicines. °· You may need to drink a large amount of a special liquid (oral bowel prep). You start drinking this the day before your procedure. It will cause you to have watery poop (stool). This cleans out your colon. °· Do not eat or drink anything else once you have started the bowel prep, unless your doctor tells you it is safe to do so. °· Make plans for someone to drive you home after the procedure. °PROCEDURE °· You will be given medicine to help you relax (sedative). °· You will lie on your side with your knees bent. °· A tube with a camera on the end is put in the opening of your butt (anus) and into your colon. Pictures are sent to a computer screen. Your doctor will look for anything that is not normal. °· Your doctor may take a tissue sample (biopsy) from your colon to be looked at more closely. °· The exam is finished when your doctor has viewed all of the colon. °AFTER THE PROCEDURE °· Do not drive for 24 hours after the exam. °· You may have a small amount of blood in your poop. This is normal. °· You may pass gas and have belly (abdominal) cramps. This is normal. °· Ask when your test results will be ready. Make sure you get your test results. °Document Released: 08/28/2010 Document Revised: 07/31/2013 Document Reviewed: 04/02/2013 °ExitCare® Patient Information ©2015 [employer]. This information is not intended to replace advice given to you by your health care provider. Make sure you discuss any questions you have with your health care provider. ° °

## 2015-04-29 NOTE — Anesthesia Postprocedure Evaluation (Signed)
  Anesthesia Post-op Note  Patient: Denise Stevens  Procedure(s) Performed: Procedure(s) (LRB): COLONOSCOPY WITH PROPOFOL (N/A)  Patient Location: PACU  Anesthesia Type: MAC  Level of Consciousness: awake and alert   Airway and Oxygen Therapy: Patient Spontanous Breathing  Post-op Pain: mild  Post-op Assessment: Post-op Vital signs reviewed, Patient's Cardiovascular Status Stable, Respiratory Function Stable, Patent Airway and No signs of Nausea or vomiting  Last Vitals:  Filed Vitals:   04/29/15 0821  BP: 144/76  Pulse:   Temp:   Resp: 15    Post-op Vital Signs: stable   Complications: No apparent anesthesia complications

## 2015-05-02 ENCOUNTER — Ambulatory Visit: Payer: 59 | Admitting: Pharmacist

## 2015-05-05 ENCOUNTER — Ambulatory Visit (INDEPENDENT_AMBULATORY_CARE_PROVIDER_SITE_OTHER): Payer: Self-pay | Admitting: Family Medicine

## 2015-05-05 ENCOUNTER — Encounter: Payer: Self-pay | Admitting: Pharmacist

## 2015-05-05 VITALS — BP 132/78 | Ht 65.5 in | Wt 321.0 lb

## 2015-05-05 DIAGNOSIS — E119 Type 2 diabetes mellitus without complications: Secondary | ICD-10-CM

## 2015-05-05 NOTE — Progress Notes (Signed)
Subjective:  Patient presents today for 3 month diabetes follow-up as part of the employer-sponsored Link to Wellness program.  Current diabetes regimen includes a cinnamon supplement.  Patient also continues on daily ARB and statin. No major health changes at this time. She has added a cinnamon supplement to her medication list. She is taking this in place of metformin which she has tolerated poorly in the past.   Patient last saw Dr. Doug Sou in August. A1C was not taken at that visit. Most recent A1C was in April, 6.7%. I am not able to check A1C at this time. Patient is changing PCPs to Dr. Lynnae January in October and will have A1C drawn at that visit. She did have an A1C taken with the Time to Focus study earlier this month and a POC A1C test was 9.5%.  She states she had a colonscopy this week that came back clean.    Assessment:  Diabetes: Most recent A1C was 6.7  % which is at goal of less than 7%. Weight is stable from last visit with me.    CBG Review: Patient did not bring meter to the appointment.   She states she is checking CBG once or twice a week. She states that it has been running in the 140-150s in the past few weeks.   One low blood sugar when she was prepping for the colonoscopy- 88 mg/dL.   Lifestyle improvements:  Physical Activity-  After her last appointment with me she was walking for 3 weeks. She states that it was OK. She was walking in the mall and she stopped because she was feeling self conscious. She states that her son is helping to push her to be more physically active.   Patient has struggled with physical activity since she started seeing me several years ago. It seems that she will get started with doing something, whether it be walking, or the BELT program, and she puts up roadblocks in her way to prevent her from succeeding. I encouraged her to continue with physical activity and explained that it will help with stress relief and energy levels in addition to  the benefits of lowering blood sugar.    Nutrition-  Eating more fruit over the summer. She is eating apples and nectarines instead of oreos in the evening. She states that she would like to eat more vegetables but she states she struggles with diarrhea after eating certain vegetables.   Now that she will be a patient of the internal medicine teaching service she wants to start seeing Debera Lat, RD. Given that her A1C is likely elevated I explained to patient that I will follow up with her in 3 months if she will plan to visit the RD sometime this fall before she comes back to see me.    Follow up with me in 3 months.    Plan/Goals for Next Visit:  1. Start physical activity for 30 minutes twice a week. Ideas for exercising- walk away the pounds DVD or walking around your complex.  2. Start trying more vegetables and see if you can find some more that you can tolerate. Start with small servings first to see if you can tolerate it without diarrhea.  3. Continue checking your blood sugar once a week in the morning.  4. After your appointment with Dr. Lynnae January, see if you can get in to see Debera Lat, RD.     Next appointment to see me is: Monday December 19th at 4:30 PM.  Megan D. Donneta Romberg, PharmD, BCPS, CDE [facility] [place] Coordinator [phone]

## 2015-05-23 ENCOUNTER — Encounter: Payer: Self-pay | Admitting: Sports Medicine

## 2015-05-23 ENCOUNTER — Ambulatory Visit (INDEPENDENT_AMBULATORY_CARE_PROVIDER_SITE_OTHER): Payer: 59 | Admitting: Sports Medicine

## 2015-05-23 VITALS — BP 147/73 | HR 92 | Ht 65.5 in | Wt 321.0 lb

## 2015-05-23 DIAGNOSIS — M7741 Metatarsalgia, right foot: Secondary | ICD-10-CM | POA: Diagnosis not present

## 2015-05-23 NOTE — Progress Notes (Signed)
   Subjective:    Patient ID: Denise Stevens, female    DOB: 11/19/1965, 49 y.o.   MRN: [id number]  HPI chief complaint: Right foot pain  Very pleasant 49 year old female comes in today complaining of 1 month of right foot pain that she localizes to the plantar aspect of her foot. Specifically she localizes the pain to the second and third metatarsal heads. Her pain began after she was wearing shoes that had a bit of a heel lift in them. Since that time she's had persistent discomfort which is worse with weightbearing. She does find that well cushioned shoes do help. She denies numbness or tingling into her toes. She denies similar problems in the past. She has not noticed any swelling. She is diabetic and is somewhat concerned about diabetic neuropathy. She is also concerned about possible bone spur formation in this area.  Past medical history reviewed Medications reviewed Allergies reviewed    Review of Systems As above    Objective:   Physical Exam Well-developed, well-nourished. No acute distress. Obese. Vital signs reviewed.  Right foot: Patient is tender to palpation at the second metatarsal head, third metatarsal head, and between the second and third metatarsal heads. Negative Mudlers but she does have some discomfort with metatarsal squeeze. No soft tissue swelling. No significant callus formation. Skin is normal temperature and normal color. Sensation is intact to light touch. Good dorsalis pedis and posterior tibial pulses. Mild pes planus with standing. Mild transverse arch collapse with standing as well.       Assessment & Plan:  Right forefoot pain secondary to metatarsalgia versus Morton's neuroma  Patient's symptoms may be originating either from a Morton's neuroma or simple metatarsalgia. I think this is likely metatarsalgia given the absence of neuropathic symptoms into her toes. The initial treatment for both of these conditions is the same. That will be with a simple  metatarsal pad which we placed in her right shoe today. I gave her a second pad to place in another pair of well cushioned walking shoes. She will avoid any shoe with an elevated heel. We will schedule a follow-up appointment in 4 weeks but the patient may cancel that appointment if asymptomatic. I reassured her that her symptoms are not diabetic neuropathy and that this is not a usual location for bone spur formation.

## 2015-05-26 NOTE — Progress Notes (Signed)
I reviewed this chart and agree with the pharmacist's recommendation.  

## 2015-06-05 ENCOUNTER — Ambulatory Visit (INDEPENDENT_AMBULATORY_CARE_PROVIDER_SITE_OTHER): Payer: 59 | Admitting: Internal Medicine

## 2015-06-05 ENCOUNTER — Encounter: Payer: Self-pay | Admitting: Internal Medicine

## 2015-06-05 ENCOUNTER — Telehealth: Payer: Self-pay | Admitting: Internal Medicine

## 2015-06-05 VITALS — BP 138/71 | HR 97 | Temp 98.2°F | Wt 327.6 lb

## 2015-06-05 DIAGNOSIS — E1169 Type 2 diabetes mellitus with other specified complication: Secondary | ICD-10-CM

## 2015-06-05 DIAGNOSIS — R4589 Other symptoms and signs involving emotional state: Secondary | ICD-10-CM

## 2015-06-05 DIAGNOSIS — F341 Dysthymic disorder: Secondary | ICD-10-CM

## 2015-06-05 DIAGNOSIS — I10 Essential (primary) hypertension: Secondary | ICD-10-CM | POA: Diagnosis not present

## 2015-06-05 DIAGNOSIS — E785 Hyperlipidemia, unspecified: Secondary | ICD-10-CM | POA: Insufficient documentation

## 2015-06-05 DIAGNOSIS — G4733 Obstructive sleep apnea (adult) (pediatric): Secondary | ICD-10-CM | POA: Diagnosis not present

## 2015-06-05 DIAGNOSIS — Z9989 Dependence on other enabling machines and devices: Secondary | ICD-10-CM | POA: Diagnosis not present

## 2015-06-05 DIAGNOSIS — M3501 Sicca syndrome with keratoconjunctivitis: Secondary | ICD-10-CM

## 2015-06-05 DIAGNOSIS — J301 Allergic rhinitis due to pollen: Secondary | ICD-10-CM

## 2015-06-05 DIAGNOSIS — Z7984 Long term (current) use of oral hypoglycemic drugs: Secondary | ICD-10-CM | POA: Diagnosis not present

## 2015-06-05 DIAGNOSIS — K219 Gastro-esophageal reflux disease without esophagitis: Secondary | ICD-10-CM | POA: Insufficient documentation

## 2015-06-05 DIAGNOSIS — E119 Type 2 diabetes mellitus without complications: Secondary | ICD-10-CM

## 2015-06-05 DIAGNOSIS — J309 Allergic rhinitis, unspecified: Secondary | ICD-10-CM | POA: Insufficient documentation

## 2015-06-05 DIAGNOSIS — E874 Mixed disorder of acid-base balance: Secondary | ICD-10-CM

## 2015-06-05 DIAGNOSIS — Z0000 Encounter for general adult medical examination without abnormal findings: Secondary | ICD-10-CM | POA: Insufficient documentation

## 2015-06-05 LAB — POCT GLYCOSYLATED HEMOGLOBIN (HGB A1C): HEMOGLOBIN A1C: 7.8

## 2015-06-05 LAB — GLUCOSE, CAPILLARY: Glucose-Capillary: 152 mg/dL — ABNORMAL HIGH (ref 65–99)

## 2015-06-05 MED ORDER — GLUCOSE BLOOD VI STRP: ORAL_STRIP | Status: DC

## 2015-06-05 MED ORDER — LIRAGLUTIDE 18 MG/3ML ~~LOC~~ SOPN: 0.6000 mg | PEN_INJECTOR | Freq: Every day | SUBCUTANEOUS | Status: DC

## 2015-06-05 NOTE — Assessment & Plan Note (Signed)
Ragweed seems to be the trigger and sxs acting up now. Restarted Zyrtec and Flonase.  PLAN : Cont meds

## 2015-06-05 NOTE — Progress Notes (Signed)
   Subjective:    Patient ID: Denise Stevens, female    DOB: [birth year], 49 y.o.   MRN: [id number]  HPI  Denise Stevens is here to establish care. Please see the A&P for the status of the pt's chronic medical problems.  Review of Systems  Constitutional: Positive for chills. Negative for appetite change and unexpected weight change.  HENT: Negative for postnasal drip, rhinorrhea, sinus pressure and sneezing.   Eyes: Negative for discharge and itching.  Respiratory: Negative for cough and shortness of breath.   Cardiovascular: Positive for leg swelling. Negative for chest pain.  Gastrointestinal: Positive for abdominal pain and diarrhea. Negative for constipation.  Endocrine: Negative for polydipsia.  Genitourinary: Negative for dysuria and frequency.  Musculoskeletal: Positive for back pain and arthralgias. Negative for gait problem.  Neurological: Negative for weakness and headaches.  Psychiatric/Behavioral: Negative for sleep disturbance and dysphoric mood.   Soc Hx : two sons 6 and middle school. Engaged. Lives in apartment 30 steps up.     Objective:   Physical Exam  Constitutional: She is oriented to person, place, and time. She appears well-developed and well-nourished. No distress.  HENT:  Head: Normocephalic and atraumatic.  Right Ear: External ear normal.  Left Ear: External ear normal.  Nose: Nose normal.  Eyes: Conjunctivae and EOM are normal.  Cardiovascular: Normal rate, regular rhythm and normal heart sounds.   Pulmonary/Chest: Effort normal and breath sounds normal.  Musculoskeletal: Normal range of motion. She exhibits edema.  Neurological: She is alert and oriented to person, place, and time.  Skin: Skin is warm and dry. She is not diaphoretic.  Psychiatric: She has a normal mood and affect. Her behavior is normal. Judgment and thought content normal.          Assessment & Plan:

## 2015-06-05 NOTE — Assessment & Plan Note (Signed)
Sxs well controlled on Protonix 40 QD.  PLAN : Cont med

## 2015-06-05 NOTE — Assessment & Plan Note (Signed)
Had home sleep study and got CPAP. The company told her they could titrate the settings if she brought machine in but they moved to [place]. Thinks is not fitting / working well bc can wake up tired but not longer gasping. Wants to change to [facility].  PLAN : Ulis Rias will talk to [facility] to see what can be done. I will review home study results.

## 2015-06-05 NOTE — Assessment & Plan Note (Signed)
Last LDL 106 in Dec 2015. On prava 40. No SE to med.  PLAN : cont med

## 2015-06-05 NOTE — Assessment & Plan Note (Signed)
Was HIV negative in past.  PLAN : Hep C, microaln, BMP, FLP, and A1C next appt.

## 2015-06-05 NOTE — Assessment & Plan Note (Signed)
Wt Readings from Last 3 Encounters:  06/05/15 327 lb 9.6 oz (148.598 kg)  05/23/15 321 lb (145.605 kg)  05/05/15 321 lb (145.605 kg)   Weight is overall stable but high and is interested in loosing weight. Son tries to get her to take stairs more freq but has arthralgias. Sister had gastric bypass but complained a lot so nervous about surgical option.   PLAN : refer to [facility]

## 2015-06-05 NOTE — Telephone Encounter (Signed)
Called and spoke with pt. She stated that she did not call but she thinks Zigmund Daniel from the Internal medicine clinic. She stated that she sees Dr. Lynnae January and that he is trying to get a CPAP titration done and needs to discuss Home Sleep Study done on 12/30/14. She gave me the back door number to the office as [phone] and stated to ask for Zigmund Daniel and Pandora.  I attempted to call Zigmund Daniel at the number listed above and LVM for her to return call.

## 2015-06-05 NOTE — Assessment & Plan Note (Signed)
Well controlled on her Lexapro and Wellbutrin. No SE to those meds. Tolerating life stressors well. Has Xanax to use PRN. Fiance's kids are age 51 and 97 and are over on weekend and are loud; stress dealing with mother's post cancer issues.  PLAN : cont current meds as doing well.

## 2015-06-05 NOTE — Assessment & Plan Note (Signed)
Now using restasis PRN and lubricating eye gtt.  PLAN : Cont meds

## 2015-06-05 NOTE — Patient Instructions (Signed)
I sent test strips, victoza to pharmacy  See me in 3 months  Let me know of any issues

## 2015-06-05 NOTE — Assessment & Plan Note (Signed)
Very well controlled on Diovan HCTA 160 - 25. No side effects. BP Readings from Last 3 Encounters:  06/05/15 138/71  05/23/15 147/73  05/05/15 132/78    PLAN : cont current meds. BMP next appt

## 2015-06-05 NOTE — Assessment & Plan Note (Signed)
Lab Results  Component Value Date   HGBA1C 7.8 06/05/2015    Currently not on any meds. Metformin causes D. Byetta caused injection site RXN. Was Rx'd Invokana but didn't take 2/2 risk of GU infxn. Tried Bictoza but caused arm itching (thinks might have ben sun exposure). No CBG at home. Working with clinical pharmacist every 90 days through [facility]. No formal exercise but son makes her takes stairs. Would like to start with water aerobics to decrease joint pain. No DM diet bc has trouble tolerating certain foods 2/2 D. Goal is to lose weight and control DM.  PLAN :  1. Referral to [facility] 2. Victoza 1.2 QQD 3. Try to fit water aerobics into daily life 4. Microalb next appt 5. F/U 3 months

## 2015-06-07 ENCOUNTER — Other Ambulatory Visit: Payer: Self-pay | Admitting: Internal Medicine

## 2015-06-07 MED ORDER — INSULIN PEN NEEDLE 32G X 6 MM MISC: Status: DC

## 2015-06-16 ENCOUNTER — Ambulatory Visit (INDEPENDENT_AMBULATORY_CARE_PROVIDER_SITE_OTHER): Payer: 59 | Admitting: Dietician

## 2015-06-16 ENCOUNTER — Encounter: Payer: Self-pay | Admitting: Dietician

## 2015-06-16 DIAGNOSIS — E119 Type 2 diabetes mellitus without complications: Secondary | ICD-10-CM

## 2015-06-16 NOTE — Progress Notes (Signed)
  Medical Nutrition Therapy:  Appt start time: 1761 end time:  1630. Visit # 1  Assessment:  Primary concerns today: diabetes meal planning.  Ms. Godfrey is here for assistance with her diet. She has had trouble tolerating some fruits and vegetables ( cause loose stools and gastric discomfort) and fears her diet is not sufficient in nutrients. She also is concerned about her carb intake affecting her blood sugars. Her recall shows dietary pattern of 3 meals/day with good timing, adequate  In protein, dairy, fruits. She has excess sugar/carb intake in her beverage choices and may be lacking in whole grains, omega 3 fatty acids and vegetables Preferred Learning Style: No preference indicated  Learning Readiness: Ready and  Change in progress  ANTHROPOMETRICS: weight: not weighed today- 327# stated, height: 65.5" BMI: 53.65- obese class 3 WEIGHT HISTORY:not addressed today SLEEP:not addressed today MEDICATIONS: does not tolerate metformin at all, is tolerating victoza without problems, increases to 1.2 mg/day tonight BLOOD SUGAR: A1C is 7.8% increased from 6. Due to stress per patient; she checks one time weekly, her blood sugar was ~150 before starting victoza, now it is ~ 105, 102 DIETARY INTAKE: Usual eating pattern includes 3 meals and 0 snacks per day. Everyday foods include diet mt dew, regular soda and sweet tea, sandwiches, pasta.  Avoided foods include milk.   24-hr recall reveals estimated calorie intake of ~2300 calories/day:  Beverages: diet mt dew, homemade sweet tea, pepsi, lemonade  Usual physical activity: has a sedentary job, dislikes most activity, but is thinking about water aerobics  Estimated energy needs: [phone] Calories/day to lose weight. (~2800 to maintain weight)  260-270 grams of carb per day  Progress Towards Goal(s):  In progress.   Nutritional Diagnosis:  NB-1.1 Food and nutrition-related knowledge deficit As related to lack of sufficient diabetes meal  planning training.  As evidenced by her report and questions.    Intervention:  Nutrition education about diabetes meal planning, healthier choices to increase whole grain, fiber, decrease empty calories. Patient says via teachback that she plans to try [place] bacon, whole grain/lower GI breads, less carbs for breakfast and in her beverages over the next 2 weeks  Teaching Method Utilized: Visual, Auditory Handouts given during visit include: AVS, fast food guide and carb counting and meal planning guide Barriers to learning/adherence to lifestyle change: competing values Demonstrated degree of understanding via:  Teach Back   Monitoring/Evaluation:  Dietary intake, exercise, meter, and body weight in 2 week(s).

## 2015-06-16 NOTE — Patient Instructions (Signed)
It was a pleasure meeting with you today!   You are doing a great job taking care of your diabetes and your family!  Call with questions!

## 2015-06-23 ENCOUNTER — Ambulatory Visit: Payer: 59 | Admitting: Sports Medicine

## 2015-06-30 ENCOUNTER — Ambulatory Visit: Payer: 59 | Admitting: Dietician

## 2015-07-02 ENCOUNTER — Ambulatory Visit: Payer: 59 | Admitting: Internal Medicine

## 2015-07-17 ENCOUNTER — Other Ambulatory Visit: Payer: Self-pay | Admitting: Internal Medicine

## 2015-07-17 DIAGNOSIS — N6310 Unspecified lump in the right breast, unspecified quadrant: Secondary | ICD-10-CM

## 2015-07-18 ENCOUNTER — Other Ambulatory Visit: Payer: Self-pay | Admitting: Internal Medicine

## 2015-07-18 MED ORDER — ESCITALOPRAM OXALATE 10 MG PO TABS
10.0000 mg | ORAL_TABLET | Freq: Every morning | ORAL | Status: DC
Start: 2015-07-18 — End: 2015-11-19

## 2015-07-22 ENCOUNTER — Ambulatory Visit
Admission: RE | Admit: 2015-07-22 | Discharge: 2015-07-22 | Disposition: A | Discharge status: Home / Self Care | Payer: 59 | Source: Ambulatory Visit | Attending: Internal Medicine | Admitting: Internal Medicine

## 2015-07-22 DIAGNOSIS — N6310 Unspecified lump in the right breast, unspecified quadrant: Secondary | ICD-10-CM

## 2015-07-28 ENCOUNTER — Ambulatory Visit (INDEPENDENT_AMBULATORY_CARE_PROVIDER_SITE_OTHER): Payer: Self-pay | Admitting: Family Medicine

## 2015-07-28 ENCOUNTER — Encounter: Payer: Self-pay | Admitting: Pharmacist

## 2015-07-28 VITALS — BP 130/76 | Ht 65.5 in | Wt 320.0 lb

## 2015-07-28 DIAGNOSIS — E119 Type 2 diabetes mellitus without complications: Secondary | ICD-10-CM

## 2015-07-28 NOTE — Progress Notes (Signed)
Subjective:  Patient presents today for 3 month diabetes follow-up as part of the employer-sponsored Link to Wellness program.  Current diabetes regimen includes Victoza 0.6 mg SQ daily,  Patient also continues on daily ASA, ACE Inhibitor and statin.  Most recent MD follow-up was with Dr. Lynnae January in October. A1C was 7.8%. Patient has a pending appt for January.  No major health changes at this time. Several OTC items and supplements were d/c. Medication profile updated.    Assessment:  Diabetes: Most recent A1C was 7.8  % which is exceeding goal of less than 7%. Weight is stable from last visit with me.   She was having some GI issues with Victoza at 1.2 mg SQ daily. She dropped back down to 0.6 mg SQ daily and has tolerated that. She will rechallenge with 1.2 mg later this week.   CBG Review: Patient did not bring meter to the appointment. She states that she is not checking on a regular basis.    Lifestyle improvements:  Physical Activity-  She reports she is doing nothing. At her last visit she was walking some during the week. She is unsure why she stopped exercising. She has struggled with getting physcial activity in the past.    Nutrition-  She recently met with the RD, Debera Lat. She got more meal planning ideas for how to stay between 45 - 60 grams of carbohydrates with each meal. She states that she is sometimes counting carbs with meals.   Since starting Victoza her appetite has been curbed. She states that she finds she can't finish meals.   When she eats out she is trying to cut back on what she is eating and taking half of her meal to save for later.   Follow up with me in 3 months.    Plan/Goals for Next Visit:  1. Start checking your blood sugar once weekly in the morning.  2. Start walking three days a week to the emergency room from your office. Try to walk this twice during your shift. Use it as your break.      Next appointment to see me is: Monday March  20th at 4:30 PM.    Jinny Blossom D. Donneta Romberg, PharmD, BCPS, CDE [facility] [place] Coordinator [phone]

## 2015-08-08 NOTE — Telephone Encounter (Signed)
This encounter was created in error - please disregard.

## 2015-08-08 NOTE — Progress Notes (Signed)
I have reviewed this pharmacist's note and agree  

## 2015-08-14 ENCOUNTER — Other Ambulatory Visit: Payer: Self-pay | Admitting: Internal Medicine

## 2015-08-14 MED ORDER — ERGOCALCIFEROL 1.25 MG (50000 UT) PO CAPS: 50000.0000 [IU] | ORAL_CAPSULE | ORAL | Status: DC

## 2015-08-15 MED FILL — VIT D2 1.25 MG (50,000 UNIT: 1.25 MG | 42 days supply | Qty: 6 | Fill #0

## 2015-08-27 ENCOUNTER — Encounter: Payer: Self-pay | Admitting: Internal Medicine

## 2015-08-27 ENCOUNTER — Ambulatory Visit (INDEPENDENT_AMBULATORY_CARE_PROVIDER_SITE_OTHER): Payer: 59 | Admitting: Internal Medicine

## 2015-08-27 VITALS — BP 125/80 | HR 89 | Temp 98.2°F | Ht 65.5 in | Wt 320.0 lb

## 2015-08-27 DIAGNOSIS — E119 Type 2 diabetes mellitus without complications: Secondary | ICD-10-CM

## 2015-08-27 DIAGNOSIS — M7702 Medial epicondylitis, left elbow: Secondary | ICD-10-CM

## 2015-08-27 DIAGNOSIS — M7582 Other shoulder lesions, left shoulder: Secondary | ICD-10-CM | POA: Diagnosis not present

## 2015-08-27 DIAGNOSIS — M7700 Medial epicondylitis, unspecified elbow: Secondary | ICD-10-CM | POA: Insufficient documentation

## 2015-08-27 DIAGNOSIS — M67919 Unspecified disorder of synovium and tendon, unspecified shoulder: Secondary | ICD-10-CM | POA: Insufficient documentation

## 2015-08-27 LAB — GLUCOSE, CAPILLARY: GLUCOSE-CAPILLARY: 177 mg/dL — AB (ref 65–99)

## 2015-08-27 NOTE — Patient Instructions (Signed)
1. I hope that the steroid injection helps your should pain.  If pain still persists, let me or Dr. Lynnae January know so we can place a referral to Sports Med or Orthopedics.  For your elbow pain, try to avoid repetitive movements that make it worse, use topical or oral NSAIDs if needed.  If you use NSAID, be sure to take with food.  You can also try an elbow brace if needed.     2. Please take all medications as prescribed.    3. If you have worsening of your symptoms or new symptoms arise, please call the clinic [phone]), or go to the ER immediately if symptoms are severe.   Rotator Cuff Tendinitis Rotator cuff tendinitis is inflammation of the tough, cord-like bands that connect muscle to bone (tendons) in your rotator cuff. Your rotator cuff is the collection of all the muscles and tendons that connect your arm to your shoulder. Your rotator cuff holds the head of your upper arm bone (humerus) in the cup (fossa) of your shoulder blade (scapula). CAUSES Rotator cuff tendinitis is usually caused by overusing the joint involved.  SIGNS AND SYMPTOMS  Deep ache in the shoulder also felt on the outside upper arm over the shoulder muscle.  Point tenderness over the area that is injured.  Pain comes on gradually and becomes worse with lifting the arm to the side (abduction) or turning it inward (internal rotation).  May lead to a chronic tear: When a rotator cuff tendon becomes inflamed, it runs the risk of losing its blood supply, causing some tendon fibers to die. This increases the risk that the tendon can fray and partially or completely tear. DIAGNOSIS Rotator cuff tendinitis is diagnosed by taking a medical history, performing a physical exam, and reviewing results of imaging exams. The medical history is useful to help determine the type of rotator cuff injury. The physical exam will include looking at the injured shoulder, feeling the injured area, and watching you do range-of-motion  exercises. X-ray exams are typically done to rule out other causes of shoulder pain, such as fractures. MRI is the imaging exam usually used for significant shoulder injuries. Sometimes a dye study called CT arthrogram is done, but it is not as widely used as MRI. In some institutions, special ultrasound tests may also be used to aid in the diagnosis. TREATMENT  Less Severe Cases  Use of a sling to rest the shoulder for a short period of time. Prolonged use of the sling can cause stiffness, weakness, and loss of motion of the shoulder joint.  Anti-inflammatory medicines, such as ibuprofen or naproxen sodium, may be prescribed. More Severe Cases  Physical therapy.  Use of steroid injections into the shoulder joint.  Surgery. HOME CARE INSTRUCTIONS   Use a sling or splint until the pain decreases. Prolonged use of the sling can cause stiffness, weakness, and loss of motion of the shoulder joint.  Apply ice to the injured area:  Put ice in a plastic bag.  Place a towel between your skin and the bag.  Leave the ice on for 20 minutes, 2-3 times a day.  Try to avoid use other than gentle range of motion while your shoulder is painful. Use the shoulder and exercise only as directed by your health care provider. Stop exercises or range of motion if pain or discomfort increases, unless directed otherwise by your health care provider.  Only take over-the-counter or prescription medicines for pain, discomfort, or fever as directed by  your health care provider.  If you were given a shoulder sling and straps (immobilizer), do not remove it except as directed, or until you see a health care provider for a follow-up exam. If you need to remove it, move your arm as little as possible or as directed.  You may want to sleep on several pillows at night to lessen swelling and pain. SEEK IMMEDIATE MEDICAL CARE IF:   Your shoulder pain increases or new pain develops in your arm, hand, or fingers and is  not relieved with medicines.  You have new, unexplained symptoms, especially increased numbness in the hands or loss of strength.  You develop any worsening of the problems that brought you in for care.  Your arm, hand, or fingers are numb or tingling.  Your arm, hand, or fingers are swollen, painful, or turn white or blue. MAKE SURE YOU:  Understand these instructions.  Will watch your condition.  Will get help right away if you are not doing well or get worse.   This information is not intended to replace advice given to you by your health care provider. Make sure you discuss any questions you have with your health care provider.   Document Released: 10/16/2003 Document Revised: 08/16/2014 Document Reviewed: 03/07/2013 Elsevier Interactive Patient Education [employer].

## 2015-08-27 NOTE — Progress Notes (Signed)
Subjective:    Patient ID: Denise Stevens, female    DOB: 03-08-1966, 50 y.o.   MRN: [id number]  HPI Comments: Denise Stevens is a 50 year old woman with PMH as below here with c/o of left shoulder pain x 6 months.  Located at anterior left shoulder.  Started out as a twinge of pain but has become worse and more frequent.  Pain is most notable at night if she sleeps on left side and when she goes to raise arm.  No systemic complaints.  She reports hx of right rotator cuff pathology in 2000 that responded to steroid injection but she eventually had to have surgery.  She was told there was a bone spur irritating one of the muscles.      Past Medical History  Diagnosis Date  . HTN (hypertension)   . Hyperlipidemia   . Depression   . Asthma     no intubation or hospitalization hx  . GERD (gastroesophageal reflux disease)   . Diabetes mellitus    Current Outpatient Prescriptions on File Prior to Visit  Medication Sig Dispense Refill  . ALPRAZolam (XANAX) 0.25 MG tablet Take 1 tablet (0.25 mg total) by mouth 3 (three) times daily as needed for anxiety. 270 tablet 0  . buPROPion (WELLBUTRIN XL) 150 MG 24 hr tablet Take 1 tablet (150 mg total) by mouth daily. 90 tablet 3  . cetirizine (ZYRTEC) 10 MG tablet Take 10 mg by mouth at bedtime as needed for allergies.     . Cholecalciferol (VITAMIN D) 2000 UNITS CAPS Take 1 capsule by mouth daily.    . ergocalciferol (VITAMIN D2) 50000 units capsule Take 1 capsule (50,000 Units total) by mouth once a week. 6 capsule 0  . escitalopram (LEXAPRO) 10 MG tablet Take 1 tablet (10 mg total) by mouth every morning. 90 tablet 3  . fluticasone (FLONASE) 50 MCG/ACT nasal spray Place 2 sprays into both nostrils daily as needed for allergies or rhinitis.    Marland Kitchen glucose blood (TRUE METRIX BLOOD GLUCOSE TEST) test strip Use as instructed 100 each 11  . Insulin Pen Needle 32G X 6 MM MISC Use to inject victoza daily 100 each 3  . Liraglutide 18 MG/3ML SOPN Inject 0.1 mLs  (0.6 mg total) into the skin daily. For one week then inject 1.2 mg daily. 18 mL 3  . metFORMIN (GLUCOPHAGE XR) 500 MG 24 hr tablet Take 1 tablet (500 mg total) by mouth daily with breakfast. (Patient not taking: Reported on 05/05/2015) 90 tablet 3  . pantoprazole (PROTONIX) 40 MG tablet Take 1 tablet (40 mg total) by mouth daily. 90 tablet 3  . polyvinyl alcohol (LIQUIFILM TEARS) 1.4 % ophthalmic solution Place 1 drop into both eyes daily as needed for dry eyes.    . pravastatin (PRAVACHOL) 40 MG tablet Take 1 tablet (40 mg total) by mouth daily. 30 tablet 3  . valsartan-hydrochlorothiazide (DIOVAN-HCT) 160-25 MG per tablet Take 1 tablet by mouth daily. 90 tablet 3   No current facility-administered medications on file prior to visit.    Review of Systems  Constitutional: Negative for fever, chills, appetite change and unexpected weight change.  Gastrointestinal: Negative for nausea, vomiting and abdominal pain.  Musculoskeletal: Positive for arthralgias. Negative for joint swelling.       Left shoulder pain.       Filed Vitals:   08/27/15 1428  BP: 125/80  Pulse: 89  Temp: 98.2 F (36.8 C)  TempSrc: Oral  Height: 5'  5.5" (1.664 m)  Weight: 320 lb (145.151 kg)  SpO2: 99%     Objective:   Physical Exam  Constitutional: She is oriented to person, place, and time. She appears well-developed. No distress.  HENT:  Head: Normocephalic and atraumatic.  Neck: Neck supple.  Cardiovascular: Normal rate, regular rhythm and normal heart sounds.  Exam reveals no gallop and no friction rub.   No murmur heard. Pulmonary/Chest: Effort normal and breath sounds normal. No respiratory distress. She has no wheezes. She has no rales.  Abdominal: Soft. Bowel sounds are normal. She exhibits no distension. There is no tenderness. There is no rebound and no guarding.  Musculoskeletal: Normal range of motion. She exhibits edema. She exhibits no tenderness.  Left arm - anterior shoulder is TTP, +  painful arc, + empty can test, neg drop arm.  Left arm - medial epicondyle TTP. 1+ B/L LE edema.  Neurological: She is alert and oriented to person, place, and time.  Strength and sensation is grossly intact and equal.  Skin: Skin is warm. She is not diaphoretic.  Psychiatric: She has a normal mood and affect. Her behavior is normal. Judgment and thought content normal.  Vitals reviewed.         Assessment & Plan:  Please see problem based charting for A&P.

## 2015-08-27 NOTE — Assessment & Plan Note (Addendum)
Assessment:  Anterior left shoulder pain and TTP, + painful arc, + empty can test, neg drop arm.  Good strength and ROM.  Exam and hx c/w rotator cuff pathology.  Neg drop arm and good strength so tear is unlikely.   Plan:  Since she has had symptoms for about 6 months with little relief from prn NSAID and she has benefited from right shoulder steroid injection in the past, will proceed with steroid injection to left shoulder today.   If symptoms persist, will plan referral to Sports Med or Ortho.  Procedure: Risk and benefits of procedure were explained. Written consent was obtained. Correct patient and site was identified. Site was marked. Area was cleaned with betadine x 3. Cooling spray was used for numbing and distraction. 1cc of lidocaine was combined with 1cc (40mg ) Kenalog and injected using a lateral approach just beneath the left acromion.   A band-aid was applied to the area. The patient tolerated the procedure and there were no immediate complications.

## 2015-08-28 NOTE — Progress Notes (Signed)
I saw and evaluated the patient.  I personally confirmed the key portions of Dr. Dois Davenport history and exam and reviewed pertinent patient test results.  The assessment, diagnosis, and plan were formulated together and I agree with the documentation in the resident's note.  I was present at Dr. Dois Davenport side for the entire procedure.  Ms. Gravier tolerated the procedure well w/o immediate complication.  She noted subjective improvement in her left shoulder pain within minutes of having the injection done.    At follow-up the following day (the day of the addendum) she noted 85% improvement in her pain and the ability to lay on her left side the night prior without shoulder pain.

## 2015-08-28 NOTE — Assessment & Plan Note (Addendum)
Assessment:  She reports left medial elbow pain x 1 month.  No increased activity or injury to explain symptoms.  She is TTP at left medial epicondyle.  There is no tenderness to left lateral epicondyle.  No significant pain with resisted wrist flexion/extension.   Plan:  Will trx conservatively - recommend NSAIDs prn with food, ice prn, she can try bracing if it helps.  She is having the desk at her job altered to see if this helps.  She will follow-up if symptoms persist beyond the next 2-4 weeks.

## 2015-09-11 ENCOUNTER — Ambulatory Visit: Payer: 59 | Admitting: Internal Medicine

## 2015-09-22 MED FILL — BUPROPION HCL XL 150 MG TAB: 150 | 90 days supply | Qty: 90 | Fill #1

## 2015-09-22 MED FILL — VALSARTAN-HCTZ 160-25 MG TA: 160-25 | 90 days supply | Qty: 90 | Fill #1

## 2015-09-22 MED FILL — PRAVASTATIN NA 40 MG TAB: 40 | 30 days supply | Qty: 30 | Fill #1

## 2015-09-26 ENCOUNTER — Ambulatory Visit (INDEPENDENT_AMBULATORY_CARE_PROVIDER_SITE_OTHER): Payer: 59 | Admitting: Internal Medicine

## 2015-09-26 VITALS — BP 127/85 | HR 93 | Temp 98.4°F | Wt 327.1 lb

## 2015-09-26 DIAGNOSIS — Z0000 Encounter for general adult medical examination without abnormal findings: Secondary | ICD-10-CM | POA: Diagnosis not present

## 2015-09-26 DIAGNOSIS — Z79899 Other long term (current) drug therapy: Secondary | ICD-10-CM

## 2015-09-26 DIAGNOSIS — D509 Iron deficiency anemia, unspecified: Secondary | ICD-10-CM | POA: Diagnosis not present

## 2015-09-26 DIAGNOSIS — F341 Dysthymic disorder: Secondary | ICD-10-CM

## 2015-09-26 DIAGNOSIS — E784 Other hyperlipidemia: Secondary | ICD-10-CM | POA: Diagnosis not present

## 2015-09-26 DIAGNOSIS — J301 Allergic rhinitis due to pollen: Secondary | ICD-10-CM

## 2015-09-26 DIAGNOSIS — G4733 Obstructive sleep apnea (adult) (pediatric): Secondary | ICD-10-CM

## 2015-09-26 DIAGNOSIS — Z6841 Body Mass Index (BMI) 40.0 and over, adult: Secondary | ICD-10-CM

## 2015-09-26 DIAGNOSIS — Z7984 Long term (current) use of oral hypoglycemic drugs: Secondary | ICD-10-CM

## 2015-09-26 DIAGNOSIS — E1169 Type 2 diabetes mellitus with other specified complication: Secondary | ICD-10-CM | POA: Diagnosis not present

## 2015-09-26 DIAGNOSIS — K219 Gastro-esophageal reflux disease without esophagitis: Secondary | ICD-10-CM | POA: Diagnosis not present

## 2015-09-26 DIAGNOSIS — R4589 Other symptoms and signs involving emotional state: Secondary | ICD-10-CM

## 2015-09-26 DIAGNOSIS — E119 Type 2 diabetes mellitus without complications: Secondary | ICD-10-CM | POA: Diagnosis not present

## 2015-09-26 DIAGNOSIS — I10 Essential (primary) hypertension: Secondary | ICD-10-CM | POA: Diagnosis not present

## 2015-09-26 DIAGNOSIS — E785 Hyperlipidemia, unspecified: Secondary | ICD-10-CM

## 2015-09-26 DIAGNOSIS — J309 Allergic rhinitis, unspecified: Secondary | ICD-10-CM

## 2015-09-26 LAB — POCT GLYCOSYLATED HEMOGLOBIN (HGB A1C): Hemoglobin A1C: 7.4

## 2015-09-26 LAB — GLUCOSE, CAPILLARY: GLUCOSE-CAPILLARY: 147 mg/dL — AB (ref 65–99)

## 2015-09-26 MED ORDER — FLUTICASONE PROPIONATE 50 MCG/ACT NA SUSP: 2.0000 | Freq: Every day | NASAL | Status: DC

## 2015-09-26 MED FILL — FLUTICASONE PROP 50 MCG SPR: 50 | 90 days supply | Qty: 48 | Fill #0

## 2015-09-26 NOTE — Progress Notes (Signed)
   Subjective:    Patient ID: Denise Stevens, female    DOB: October 09, 1965, 50 y.o.   MRN: [id number]  HPI  Denise Stevens is here for DM F/U. Please see the A&P for the status of the pt's chronic medical problems.  ROS : per ROS section and in problem oriented charting. All other systems are negative. PMHx, Soc hx, and / or Fam hx : Engaged. Mother ill with bronchitis   Review of Systems  Constitutional: Negative for fever, activity change and appetite change.  HENT: Positive for congestion, ear pain, postnasal drip, sinus pressure and voice change. Negative for sneezing and sore throat.        + snoring  Eyes: Positive for discharge and itching.  Respiratory: Negative for cough and shortness of breath.   Cardiovascular: Negative for chest pain.  Gastrointestinal: Negative for abdominal pain and diarrhea.  Endocrine: Positive for heat intolerance.       Hot flashes  Musculoskeletal:       Less arthralgias after Vit D  Neurological: Positive for headaches. Negative for dizziness.  Psychiatric/Behavioral: Positive for sleep disturbance.       Objective:   Physical Exam  Constitutional: She is oriented to person, place, and time. She appears well-developed and well-nourished. No distress.  HENT:  Head: Normocephalic and atraumatic.  Right Ear: External ear normal.  Left Ear: External ear normal.  Nose: Nose normal.  Throat red, no exudate, small uvela Canals red B, TM's OK - good light reflex, no fluid.  Eyes: Conjunctivae and EOM are normal. Right eye exhibits no discharge. Left eye exhibits no discharge.  Neck: Normal range of motion. Thyromegaly present.  Submandibular B - tender  Cardiovascular: Normal rate, regular rhythm and normal heart sounds.   Pulmonary/Chest: Effort normal and breath sounds normal. No respiratory distress.  Neurological: She is alert and oriented to person, place, and time.  Skin: Skin is warm and dry. No rash noted. She is not diaphoretic. No  erythema. No pallor.  Psychiatric: She has a normal mood and affect. Her behavior is normal. Judgment and thought content normal.          Assessment & Plan:

## 2015-09-26 NOTE — Assessment & Plan Note (Signed)
On prava 40 - mod intensity. Primary prevention. Last LDL 106 in 2015.   PLAN : FLP today. Cont med

## 2015-09-26 NOTE — Assessment & Plan Note (Signed)
Not using bc mask doesn't fit well. Snoring more now. Weight up.  PLAN : talk to [facility] about [place] and mask.

## 2015-09-26 NOTE — Assessment & Plan Note (Signed)
BP Readings from Last 3 Encounters:  09/26/15 162/95  08/27/15 125/80  07/28/15 130/76   BP up today bc upset about weight. On Diovan HCTZ 160/25. Rechecking pressure today. Has been at goal prior today.  PLAN:  Cont current meds

## 2015-09-26 NOTE — Assessment & Plan Note (Addendum)
Hot flashes - bad for 1.5 yrs now. Slept with fan and A/C last PM. Night gown neck wet / hair wet. On lexapro but not really helping. We discussed options - Vit E, changing SSRI / SNRI, gabapentin, pregabalin, clonidine, hormones. For now, wants to try Vit E and sees how she does.  PLAN : Vit E 100 - 400 units/ day   Lab work : HIV, Hep C, microalb, FLP, ferritin, CBC

## 2015-09-26 NOTE — Assessment & Plan Note (Signed)
3 weeks of sinus congestion, ear pain, LN pain, throat clearing, HA, eye pain. On Flonase, zyrtec, nyquil, sudafed. We discussed viral / allergic / bacterial. Wants to cont current tx and see.

## 2015-09-26 NOTE — Assessment & Plan Note (Signed)
Gets sxs if doesn't take her meds QD. On Protonix 40 QD.  PLAN:  Cont current meds

## 2015-09-26 NOTE — Assessment & Plan Note (Signed)
On lexapro and wellbutrin for a long time. Started for depression. When ill, stops bc doesn't feel well and feels bad - withdrawal sxs. I discussed that we could taper and stop one but wants to cont them for now.  PLAN:  Cont current meds

## 2015-09-26 NOTE — Assessment & Plan Note (Signed)
Wt Readings from Last 3 Encounters:  09/26/15 327 lb 1.6 oz (148.372 kg)  08/27/15 320 lb (145.151 kg)  07/28/15 320 lb (145.151 kg)   Weight up but has on shoes. Trying to eat well, but certain foods cause intolerance - D - which makes working hard. Also working with [employer] health program.  PLAN : cont to encourage and follow. No weight loss with victoza.

## 2015-09-26 NOTE — Assessment & Plan Note (Signed)
Lab Results  Component Value Date   HGBA1C 7.4 09/26/2015   Down from 7.8. On Victoza 1.2 QD. Appetite less on the med. Working to get DM better controlled. Foot exam today. Microalb today.  PLAN:  Cont current meds F/U 3 months

## 2015-09-27 LAB — CBC
Hematocrit: 35.1 % (ref 34.0–46.6)
Hemoglobin: 11.3 g/dL (ref 11.1–15.9)
MCH: 26.9 pg (ref 26.6–33.0)
MCHC: 32.2 g/dL (ref 31.5–35.7)
MCV: 84 fL (ref 79–97)
PLATELETS: 309 10*3/uL (ref 150–379)
RBC: 4.2 x10E6/uL (ref 3.77–5.28)
RDW: 14.9 % (ref 12.3–15.4)
WBC: 11.2 10*3/uL — AB (ref 3.4–10.8)

## 2015-09-27 LAB — MICROALBUMIN / CREATININE URINE RATIO
CREATININE, UR: 89.1 mg/dL
MICROALB/CREAT RATIO: <3.4 mg/g creat (ref 0.0–30.0)
Microalbumin, Urine: <3 ug/mL

## 2015-09-27 LAB — LIPID PANEL
CHOL/HDL RATIO: 3.4 ratio (ref 0.0–4.4)
Cholesterol, Total: 175 mg/dL (ref 100–199)
HDL: 51 mg/dL (ref >39)
LDL CALC: 89 mg/dL (ref 0–99)
Triglycerides: 177 mg/dL — ABNORMAL HIGH (ref 0–149)
VLDL Cholesterol Cal: 35 mg/dL (ref 5–40)

## 2015-09-27 LAB — BMP8+ANION GAP
Anion Gap: 16 mmol/L (ref 10.0–18.0)
BUN / CREAT RATIO: 11 (ref 9–23)
BUN: 9 mg/dL (ref 6–24)
CHLORIDE: 98 mmol/L (ref 96–106)
CO2: 24 mmol/L (ref 18–29)
Calcium: 9.3 mg/dL (ref 8.7–10.2)
Creatinine, Ser: 0.85 mg/dL (ref 0.57–1.00)
GFR calc Af Amer: 92 mL/min/{1.73_m2} (ref >59)
GFR calc non Af Amer: 80 mL/min/{1.73_m2} (ref >59)
GLUCOSE: 152 mg/dL — AB (ref 65–99)
Potassium: 4 mmol/L (ref 3.5–5.2)
Sodium: 138 mmol/L (ref 134–144)

## 2015-09-27 LAB — FERRITIN: Ferritin: 134 ng/mL (ref 15–150)

## 2015-09-27 LAB — HEPATITIS C ANTIBODY: Hep C Virus Ab: <0.1 s/co ratio (ref 0.0–0.9)

## 2015-09-27 LAB — HIV ANTIBODY (ROUTINE TESTING W REFLEX): HIV Screen 4th Generation wRfx: NONREACTIVE

## 2015-09-29 ENCOUNTER — Other Ambulatory Visit: Payer: Self-pay | Admitting: Internal Medicine

## 2015-09-29 DIAGNOSIS — G4733 Obstructive sleep apnea (adult) (pediatric): Secondary | ICD-10-CM

## 2015-10-09 ENCOUNTER — Telehealth: Payer: Self-pay | Admitting: Internal Medicine

## 2015-10-09 NOTE — Telephone Encounter (Signed)
FYI- I have placed a call with the [facility] to get patient's Supplies.  Left a message on their voice mail for them to return my call to make sure we do not have to send any updated OV notes in order for her to receive her supplies.

## 2015-10-26 ENCOUNTER — Ambulatory Visit (INDEPENDENT_AMBULATORY_CARE_PROVIDER_SITE_OTHER): Payer: 59 | Admitting: Internal Medicine

## 2015-10-26 VITALS — BP 144/86 | HR 93 | Temp 98.7°F | Ht 65.5 in | Wt 325.0 lb

## 2015-10-26 DIAGNOSIS — Z20828 Contact with and (suspected) exposure to other viral communicable diseases: Secondary | ICD-10-CM | POA: Diagnosis not present

## 2015-10-26 MED ORDER — OSELTAMIVIR PHOSPHATE 75 MG PO CAPS: 75.0000 mg | ORAL_CAPSULE | Freq: Two times a day (BID) | ORAL | Status: DC

## 2015-10-26 NOTE — Patient Instructions (Signed)
     IF you received an x-ray today, you will receive an invoice from [facility]. Please contact [facility] at [phone] with questions or concerns regarding your invoice.   IF you received labwork today, you will receive an invoice from [facility]. Please contact [facility] at [phone] with questions or concerns regarding your invoice.   Our billing staff will not be able to assist you with questions regarding bills from these companies.  You will be contacted with the lab results as soon as they are available. The fastest way to get your results is to activate your My Chart account. Instructions are located on the last page of this paperwork. If you have not heard from us regarding the results in 2 weeks, please contact this office.      

## 2015-10-26 NOTE — Progress Notes (Signed)
Subjective:  By signing my name below, I, Moises Blood, attest that this documentation has been prepared under the direction and in the presence of Tami Lin, MD. Electronically Signed: Moises Blood, [place]. 10/26/2015 , 2:21 PM .  Patient was seen in Room 10 .   Patient ID: Denise Stevens, female    DOB: 1965/08/20, 50 y.o.   MRN: [id number] Chief Complaint  Patient presents with  . Headache    today symptoms began  . Generalized Body Aches  . Extremity Weakness   HPI Denise Stevens is a 50 y.o. female who presents to [facility] complaining of flu-like symptoms that started with a headache today. She has generalized myalgia and feeling fatigue. She denies any cough.   She received a flu shot.  She has a sick contact by her son, who has similar symptoms and was also seen today.   Patient Active Problem List   Diagnosis Date Noted  . Tendinopathy of rotator cuff 08/27/2015  . Epicondylitis, medial humeral 08/27/2015  . Hyperlipidemia associated with type 2 diabetes mellitus ([place]) 06/05/2015  . GERD (gastroesophageal reflux disease) 06/05/2015  . Allergic rhinitis 06/05/2015  . Keratoconjunctivitis sicca ([place]) 06/05/2015  . Health care maintenance 06/05/2015  . Morbid obesity ([place]) 03/31/2015  . Carpal tunnel syndrome, left 12/15/2014  . Essential hypertension 08/06/2014  . Dysphoric mood 08/06/2014  . Obstructive sleep apnea 01/21/2014  . Diabetes mellitus type 2, controlled, without complications ([place]) [id number]  . TROCHANTERIC BURSITIS, LEFT 01/17/2009  . UNEQUAL LEG LENGTH 01/17/2009    Current outpatient prescriptions:  .  ALPRAZolam (XANAX) 0.25 MG tablet, Take 1 tablet (0.25 mg total) by mouth 3 (three) times daily as needed for anxiety., Disp: 270 tablet, Rfl: 0 .  buPROPion (WELLBUTRIN XL) 150 MG 24 hr tablet, Take 1 tablet (150 mg total) by mouth daily., Disp: 90 tablet, Rfl: 3 .  cetirizine (ZYRTEC) 10 MG tablet, Take 10 mg by mouth at bedtime as needed for  allergies. , Disp: , Rfl:  .  Cholecalciferol (VITAMIN D) 2000 UNITS CAPS, Take 1 capsule by mouth daily., Disp: , Rfl:  .  escitalopram (LEXAPRO) 10 MG tablet, Take 1 tablet (10 mg total) by mouth every morning., Disp: 90 tablet, Rfl: 3 .  fluticasone (FLONASE) 50 MCG/ACT nasal spray, Place 2 sprays into both nostrils daily., Disp: 48 g, Rfl: 3 .  glucose blood (TRUE METRIX BLOOD GLUCOSE TEST) test strip, Use as instructed, Disp: 100 each, Rfl: 11 .  Insulin Pen Needle 32G X 6 MM MISC, Use to inject victoza daily, Disp: 100 each, Rfl: 3 .  Liraglutide 18 MG/3ML SOPN, Inject 0.1 mLs (0.6 mg total) into the skin daily. For one week then inject 1.2 mg daily., Disp: 18 mL, Rfl: 3 .  pantoprazole (PROTONIX) 40 MG tablet, Take 1 tablet (40 mg total) by mouth daily., Disp: 90 tablet, Rfl: 3 .  polyvinyl alcohol (LIQUIFILM TEARS) 1.4 % ophthalmic solution, Place 1 drop into both eyes daily as needed for dry eyes., Disp: , Rfl:  .  pravastatin (PRAVACHOL) 40 MG tablet, Take 1 tablet (40 mg total) by mouth daily., Disp: 30 tablet, Rfl: 3 .  valsartan-hydrochlorothiazide (DIOVAN-HCT) 160-25 MG per tablet, Take 1 tablet by mouth daily., Disp: 90 tablet, Rfl: 3 .  ergocalciferol (VITAMIN D2) 50000 units capsule, Take 1 capsule (50,000 Units total) by mouth once a week. (Patient not taking: Reported on 10/26/2015), Disp: 6 capsule, Rfl: 0 .  metFORMIN (GLUCOPHAGE XR) 500 MG 24 hr tablet,  Take 1 tablet (500 mg total) by mouth daily with breakfast. (Patient not taking: Reported on 05/05/2015), Disp: 90 tablet, Rfl: 3  Review of Systems  Constitutional: Positive for fatigue. Negative for fever and diaphoresis.  HENT: Positive for rhinorrhea and sore throat.   Respiratory: Negative for cough, shortness of breath and wheezing.   Gastrointestinal: Negative for nausea, vomiting and diarrhea.  Musculoskeletal: Positive for myalgias.  Neurological: Positive for headaches. Negative for dizziness.      Objective:    Physical Exam  Constitutional: She is oriented to person, place, and time. She appears well-developed and well-nourished. No distress.  HENT:  Head: Normocephalic and atraumatic.  Nose: Rhinorrhea (clear) present.  Mouth/Throat: Posterior oropharyngeal erythema present. No oropharyngeal exudate.  Eyes: EOM are normal. Pupils are equal, round, and reactive to light.  Neck: Neck supple.  Cardiovascular: Normal rate.   Pulmonary/Chest: Effort normal and breath sounds normal. No respiratory distress. She has no wheezes.  Musculoskeletal: Normal range of motion.  Neurological: She is alert and oriented to person, place, and time.  Skin: Skin is warm and dry.  Psychiatric: She has a normal mood and affect. Her behavior is normal.  Nursing note and vitals reviewed.   BP 144/86 mmHg  Pulse 93  Temp(Src) 98.7 F (37.1 C) (Oral)  Ht 5' 5.5" (1.664 m)  Wt 325 lb (147.419 kg)  BMI 53.24 kg/m2  SpO2 98%    Assessment & Plan:   I have completed the patient encounter in its entirety as documented by the scribe, with editing by me where necessary. Besse Miron P. Laney Pastor, M.D.  Flu  Meds ordered this encounter  Medications  . DISCONTD: oseltamivir (TAMIFLU) 75 MG capsule    Sig: Take 1 capsule (75 mg total) by mouth 2 (two) times daily.    Dispense:  10 capsule    Refill:  0  . oseltamivir (TAMIFLU) 75 MG capsule    Sig: Take 1 capsule (75 mg total) by mouth 2 (two) times daily.    Dispense:  10 capsule    Refill:  0   otc support Work fever resolv

## 2015-10-27 ENCOUNTER — Ambulatory Visit: Payer: Self-pay | Admitting: Pharmacist

## 2015-10-31 ENCOUNTER — Ambulatory Visit (INDEPENDENT_AMBULATORY_CARE_PROVIDER_SITE_OTHER): Payer: Self-pay | Admitting: Family Medicine

## 2015-10-31 ENCOUNTER — Encounter: Payer: Self-pay | Admitting: Pharmacist

## 2015-10-31 VITALS — BP 122/78 | Wt 324.6 lb

## 2015-10-31 DIAGNOSIS — E119 Type 2 diabetes mellitus without complications: Secondary | ICD-10-CM

## 2015-10-31 MED FILL — PANTOPRAZOLE SOD DR 40 MG T: 40 | 90 days supply | Qty: 90 | Fill #3

## 2015-10-31 MED FILL — VICTOZA 18 MG/3 ML INJECT P: 18 | 90 days supply | Qty: 18 | Fill #1

## 2015-10-31 MED FILL — NOVOFINE 32G NEEDLES: 32G X 6 MM | 90 days supply | Qty: 100 | Fill #1

## 2015-10-31 NOTE — Progress Notes (Signed)
Subjective:  Patient presents today for 3 month diabetes follow-up as part of the employer-sponsored Link to Wellness program.  Current diabetes regimen includes Victoza 0.6 mg SQ once daily, Patient also continues on daily ASA, ACE Inhibitor and statin.  Most recent MD follow-up was with Dr. Lynnae January in February. A1C at that visit was 7.4%. Patient has a pending appt for May 2017.  No major health changes at this time.   Patient is finishing up a 5 day course of Tamiflu for influenza treatment. She has started Vitamin E 400 IU once daily for hot flashes. She states that it has been alleviating some of her hot flash symptoms.   Assessment:  Diabetes: Most recent A1C was 7.4  % which is exceeding goal of less than 7%. Weight is increased from last visit with me.   She has tried Victoza 1.2 mg SQ daily (how the medication is actually prescribed) when she first started taking Victoza  last fall but stated that she felt bloated and went back to taking 0.6 mg SQ daily.   Patient admits that she has difficulty with taking medication on a regular basis. She is filling up several weeks worth of pillboxes at a time and then is keeping them in her purse. She ran out of her last pillbox earlier this week and was out of meds for several days.   CBG Review: Patient has three CBG checks since her last visit with me. She checked CBG in the office with me today and CBG was 160 mg/dL (patient is fasting but had a gummy vitamin C this morning).   She states that when she has checked her CBG 2 hours after a meal it was 187.   She denies hypoglycemia.    Lifestyle improvements:  Physical Activity-  None. She almost went to a line dancing class a few weeks ago but didn't feel well so she didn't go. She did check out the [facility] close to her to look at water aerobic classes.   She has been walking more during the day while she is at work.   Patient has struggled with getting physical activity in the past. I  strongly encouraged her to follow up with the water aerobics classes and reminded patient of the physical and mental benefits of exercise. Patient states she has a bathing suit already and just would like to purchase water shoes.    Nutrition-  She reports no major changes. Lunches are still catered in during the week but she continues to abstain from concentrated sweets.    Follow up with me in 3 months.    Plan/Goals for Next Visit:  1. Try going back up to Victoza 1.2 mg SQ again and see if you can tolerate it. If not, go back down to 0.6 mg SQ daily.  2. Refill your pillboxes so you can start taking your medications again on a regular basis.  3. Check out the [facility] and then decide where you want to start taking water aerobics. Attend at least one class before you come back for a visit with me.  4. Check your blood sugar at least once weekly. Saturday morning is your day- check it first thing before you have anything to eat.    Next appointment to see me is: Friday June 2nd at 3:30 PM.    Jinny Blossom D. Donneta Romberg, PharmD, BCPS, CDE [facility] [place] Coordinator [phone]

## 2015-11-19 ENCOUNTER — Other Ambulatory Visit: Payer: Self-pay | Admitting: Internal Medicine

## 2015-11-19 ENCOUNTER — Other Ambulatory Visit: Payer: Self-pay

## 2015-11-19 MED ORDER — PRAVASTATIN SODIUM 40 MG PO TABS: 40.0000 mg | ORAL_TABLET | Freq: Every day | ORAL | Status: DC

## 2015-11-19 MED ORDER — ESCITALOPRAM OXALATE 10 MG PO TABS: 10.0000 mg | ORAL_TABLET | Freq: Every morning | ORAL | Status: DC

## 2015-11-20 NOTE — Progress Notes (Signed)
I have reviewed this pharmacist's note and agree  

## 2015-12-05 DIAGNOSIS — Z01419 Encounter for gynecological examination (general) (routine) without abnormal findings: Secondary | ICD-10-CM | POA: Diagnosis not present

## 2015-12-05 DIAGNOSIS — L292 Pruritus vulvae: Secondary | ICD-10-CM | POA: Diagnosis not present

## 2015-12-05 DIAGNOSIS — Z6841 Body Mass Index (BMI) 40.0 and over, adult: Secondary | ICD-10-CM | POA: Diagnosis not present

## 2015-12-05 DIAGNOSIS — Z1151 Encounter for screening for human papillomavirus (HPV): Secondary | ICD-10-CM | POA: Diagnosis not present

## 2015-12-05 MED FILL — ESCITALOPRAM 10 MG TABLET: 10 | 90 days supply | Qty: 90 | Fill #1

## 2015-12-05 MED FILL — FLUCONAZOLE 150 MG TABLET: 150 | 3 days supply | Qty: 3 | Fill #0

## 2015-12-05 MED FILL — TINIDAZOLE 500 MG TABLET: 500 | 2 days supply | Qty: 8 | Fill #0

## 2015-12-10 ENCOUNTER — Encounter: Payer: Self-pay | Admitting: Sports Medicine

## 2015-12-10 ENCOUNTER — Ambulatory Visit (INDEPENDENT_AMBULATORY_CARE_PROVIDER_SITE_OTHER): Payer: 59 | Admitting: Sports Medicine

## 2015-12-10 VITALS — BP 148/74 | Ht 65.0 in | Wt 323.0 lb

## 2015-12-10 DIAGNOSIS — S39012A Strain of muscle, fascia and tendon of lower back, initial encounter: Secondary | ICD-10-CM

## 2015-12-10 DIAGNOSIS — M25562 Pain in left knee: Secondary | ICD-10-CM | POA: Diagnosis not present

## 2015-12-10 MED ORDER — METHYLPREDNISOLONE ACETATE 40 MG/ML IJ SUSP
40.0000 mg | Freq: Once | INTRAMUSCULAR | Status: AC
  Administered 2015-12-10: 40 mg via INTRA_ARTICULAR

## 2015-12-10 MED ORDER — METAXALONE 800 MG PO TABS: ORAL_TABLET | ORAL | Status: DC

## 2015-12-10 MED FILL — METAXALONE 800 MG TABLET: 800 | 30 days supply | Qty: 90 | Fill #0

## 2015-12-10 NOTE — Progress Notes (Signed)
Subjective:    Patient ID: Denise Stevens, female    DOB: 04-23-1966, 50 y.o.   MRN: [id number]  HPI  The patient notes approximately 10 days (12/06/15) ago [place] her everted her L ankle and injured her L knee. She was able to walk. She had no pain at that time.  She started having pain in her L knee and limping on Tuesday. On Saturday she started having popping with ambulation that is not associated with pain. Pain worsens with ambulation. She started noticing L calf pain after that, then posterior thigh, then hip and lumbar pain which resolved Sunday/Monday.   Today she has L knee pain and R lumbar pain. She had a knot on her left knee. No swelling, erythema, or ecchymoses noted.  Her right lumbar pain is a 8/10 and pulling, mostly with walking.   Aleve two tablets daily which helps the knee but not helping the right lumbar pain.   Review of Systems all negative besides that noted in HPI      Objective:   Physical Exam  Blood pressure 148/74, height 5\' 5"  (1.651 m), weight 323 lb (146.512 kg).  Well-nourished, in NAD  Left knee: no erythema or ecchymosis noted. Mild/moderate sized effusion noted.  Tenderness to palpation over the medial joint line. No crepitus noted.   No obvious Baker's cysts. No TTP along infrapatellar or pes anserine bursas.   ROM normal in flexion (135 degrees) and extension (0 degrees) and lower leg rotation. Ligaments with solid consistent endpoints including ACL, PCL, LCL, MCL.   Negative Mcmurray's and Thessaly (both normal) Non painful patellar compression.  Normal Patellar glide.  No apprehension  Patellar and quadriceps tendons unremarkable. Hamstring and quadriceps strength is normal. Full ROM in the hips bilaterally.  No swelling or erythema noted on back exam. No tenderness over the spinous processes. Tenderness to palpation in the right lumbrosacral paraspinal muscles with some muscular tension.   Procedure: left  knee injection  Consent signed and scanned into record. Medication:  1 cc Depo-Medrol 3 cc Lidocaine 1% without epi Preparation: area cleansed with alcoholTime Out taken  Landmarks identified 4 cc of medication injected into joint space using a anterolateral approach by Dr. Micheline Chapman  Patient tolerated well without bleeding or paresthesias  Patient had good range of motion of joint after injection      Assessment & Plan:  Left knee pain: most likely from acute injury. Doubt ligamentous tear or meniscal injury given exam findings. Suspect she may have some OA contributing as well. Right lumbar pain most likely from abnormal gait due to recent injury. Injection to left knee today. Compression sleeve given. Continue to rest and ice knee. Rx for Skelaxin 800mg  TID PRN provided for paraspinal muscle tension. If no improvement, patient to follow up, would most likely obtain imaging of the L knee at that time.  Archie Patten, MD [facility] Resident  12/10/2015, 12:29 PM   Patient seen and evaluated with the resident. I agree with the above plan of care. I injected the patient's left knee today after risks and benefits were explained. She tolerated this without difficulty. We will also have her use a compression sleeve with activity for the next few days. We provided her with a prescription for Skelaxin to take for her low back pain (she has had good success with this in the past). If her knee pain persists, I would start with getting plain x-rays. If  her low back pain persists we may consider formal physical therapy. She will follow-up with me for ongoing or recalcitrant issues.

## 2015-12-19 DIAGNOSIS — H524 Presbyopia: Secondary | ICD-10-CM | POA: Diagnosis not present

## 2015-12-19 DIAGNOSIS — E119 Type 2 diabetes mellitus without complications: Secondary | ICD-10-CM | POA: Diagnosis not present

## 2015-12-19 DIAGNOSIS — H25013 Cortical age-related cataract, bilateral: Secondary | ICD-10-CM | POA: Diagnosis not present

## 2015-12-19 DIAGNOSIS — H2513 Age-related nuclear cataract, bilateral: Secondary | ICD-10-CM | POA: Diagnosis not present

## 2015-12-19 DIAGNOSIS — H43811 Vitreous degeneration, right eye: Secondary | ICD-10-CM | POA: Diagnosis not present

## 2015-12-19 DIAGNOSIS — G4733 Obstructive sleep apnea (adult) (pediatric): Secondary | ICD-10-CM | POA: Diagnosis not present

## 2015-12-22 MED FILL — PRAVASTATIN NA 40 MG TAB: 40 | 90 days supply | Qty: 90 | Fill #0

## 2015-12-31 DIAGNOSIS — G4733 Obstructive sleep apnea (adult) (pediatric): Secondary | ICD-10-CM | POA: Diagnosis not present

## 2016-01-09 ENCOUNTER — Ambulatory Visit: Payer: Self-pay | Admitting: Pharmacist

## 2016-01-16 ENCOUNTER — Encounter: Payer: Self-pay | Admitting: Pharmacist

## 2016-01-16 ENCOUNTER — Other Ambulatory Visit: Payer: Self-pay | Admitting: Pharmacist

## 2016-01-16 VITALS — BP 128/76 | Wt 324.2 lb

## 2016-01-16 DIAGNOSIS — E119 Type 2 diabetes mellitus without complications: Secondary | ICD-10-CM

## 2016-01-16 LAB — POCT GLYCOSYLATED HEMOGLOBIN (HGB A1C): Hemoglobin A1C: 8.3

## 2016-01-16 NOTE — Patient Outreach (Signed)
Subjective:  Patient presents today for 3 month diabetes follow-up as part of the employer-sponsored Link to Wellness program.  Current diabetes regimen includes Victoza 0. 6 mg SQ once daily. Patient also continues on daily ARB and statin.  Most recent MD follow-up was in February with Dr. Lynnae January. A1C at that visit was 7.4%.  Patient intends to schedule a follow up appointment for July.  No major health changes at this time.   Medication changes- she injured her knee in April and has been taking naproxen 440 mg once daily (down from BID when she first injured it).   Patient requested that I check her A1C today.   Assessment:  Diabetes: Most recent A1C today was 8.3  % which is exceeding goal of less than 7%. Weight is stable from last visit with me.   Patient states she has been under increased stress in her home life recently and this is why she thinks her A1C is up. She also admits to not exercising and she has not been making healthy eating choices.   CBG Review: She states she is supposed to be checking once a week, but is actually checking about twice a month. Checking at random times during the day.   High/Low- 205/105  Denies hypoglycemia.   Patient states that she is taking Victoza in the afternoon. Also she is missing doses of medication on the weekend because she is not close to her purse to be able to take her medication or does not have anything to eat with her medication (she states one of her meds she has to take with food or else it causes her to choke).   Lifestyle improvements:  Physical Activity-  None. This was a goal for her last time but she did not achieve it. I emphasized with patient how much exercise could help with her emotional health and stress relief. Previously she was walking to the ED a few times during her shift. She states that she thinks she could start that again and maybe could spend a little bit of time on the exercise bike in the employee gym.    Nutrition-  Weight is stable since her last visit with me. She reports that she is not getting as much vegetables as she usually is eating.   She reports she is drinking sweet tea in the afternoon.   She also reports that she is eating a lot of potaotes, especially for dinner. She is eating french fries, creamed potatoes, hash browns, baked potatoes. The rest of her meal review sounds like she is doing OK at breakfast (2 slices whole wheat toast, boiled egg, 3 strips [place] bacon), but lunch and dinner she is eating more, especially carbohydrates.   Reviewed nutrition information for fruits that she would like to start eating, including cantaloupe, watermelon and grapes. Reviewed with patient how to read a nutrition label and how to estimate portions sizes. Recommended to patient to stay around 15 grams of carbohydrate per serving of fruit.   Follow up with me in 2 months. I will send A1C result to Dr. Lynnae January.    Plan/Goals for Next Visit:  1. Move Victoza dose to the morning. Take it with breakfast. Keep the pen with you in your pocketbook.  2. Set an alarm to help you remember to take your medications on the weekends.  3. Reduce the amount of potatoes you are eating each week. Aim for 2 servings a week (down from eating daily). Replace this with vegetables (  green beans) and fruit (cantaloupe).  4. Continue checking CBG once a week.  5. Aim to have some physical activity during the week- even if it is walking to the ED or 10-15 minutes on the exercise bike in the employee gym.     Next appointment to see me is: Friday August 11th at 9 AM.    Truett [place]. Donneta Romberg, PharmD, BCPS, CDE [facility] [place] Coordinator [phone]

## 2016-01-19 DIAGNOSIS — G4733 Obstructive sleep apnea (adult) (pediatric): Secondary | ICD-10-CM | POA: Diagnosis not present

## 2016-02-18 DIAGNOSIS — G4733 Obstructive sleep apnea (adult) (pediatric): Secondary | ICD-10-CM | POA: Diagnosis not present

## 2016-02-24 ENCOUNTER — Telehealth: Payer: Self-pay | Admitting: *Deleted

## 2016-02-24 MED ORDER — CYCLOSPORINE 0.05 % OP EMUL: 1.0000 [drp] | Freq: Two times a day (BID) | OPHTHALMIC | Status: DC

## 2016-02-24 MED FILL — RESTASIS 0.05% EYE EMULSION: 0.05 | 90 days supply | Qty: 180 | Fill #0

## 2016-02-24 NOTE — Telephone Encounter (Signed)
done

## 2016-02-24 NOTE — Telephone Encounter (Signed)
Fax from [facility] - requesting refill on Restasis 0.05% eye emulsion  Instill 1 drop in both eyes twice a day Qty 240  Thanks

## 2016-02-26 ENCOUNTER — Encounter: Payer: Self-pay | Admitting: Internal Medicine

## 2016-02-26 ENCOUNTER — Ambulatory Visit (INDEPENDENT_AMBULATORY_CARE_PROVIDER_SITE_OTHER): Payer: 59 | Admitting: Internal Medicine

## 2016-02-26 VITALS — BP 120/80 | HR 99 | Temp 98.2°F | Wt 322.9 lb

## 2016-02-26 DIAGNOSIS — R4589 Other symptoms and signs involving emotional state: Secondary | ICD-10-CM

## 2016-02-26 DIAGNOSIS — E1169 Type 2 diabetes mellitus with other specified complication: Secondary | ICD-10-CM | POA: Diagnosis not present

## 2016-02-26 DIAGNOSIS — L918 Other hypertrophic disorders of the skin: Secondary | ICD-10-CM | POA: Diagnosis not present

## 2016-02-26 DIAGNOSIS — Z9989 Dependence on other enabling machines and devices: Secondary | ICD-10-CM

## 2016-02-26 DIAGNOSIS — M3501 Sicca syndrome with keratoconjunctivitis: Secondary | ICD-10-CM

## 2016-02-26 DIAGNOSIS — J301 Allergic rhinitis due to pollen: Secondary | ICD-10-CM

## 2016-02-26 DIAGNOSIS — Z0000 Encounter for general adult medical examination without abnormal findings: Secondary | ICD-10-CM

## 2016-02-26 DIAGNOSIS — K219 Gastro-esophageal reflux disease without esophagitis: Secondary | ICD-10-CM | POA: Diagnosis not present

## 2016-02-26 DIAGNOSIS — E784 Other hyperlipidemia: Secondary | ICD-10-CM | POA: Diagnosis not present

## 2016-02-26 DIAGNOSIS — G4733 Obstructive sleep apnea (adult) (pediatric): Secondary | ICD-10-CM

## 2016-02-26 DIAGNOSIS — Z79899 Other long term (current) drug therapy: Secondary | ICD-10-CM

## 2016-02-26 DIAGNOSIS — E559 Vitamin D deficiency, unspecified: Secondary | ICD-10-CM

## 2016-02-26 DIAGNOSIS — F341 Dysthymic disorder: Secondary | ICD-10-CM

## 2016-02-26 DIAGNOSIS — E785 Hyperlipidemia, unspecified: Secondary | ICD-10-CM

## 2016-02-26 DIAGNOSIS — Z6841 Body Mass Index (BMI) 40.0 and over, adult: Secondary | ICD-10-CM

## 2016-02-26 DIAGNOSIS — E119 Type 2 diabetes mellitus without complications: Secondary | ICD-10-CM

## 2016-02-26 DIAGNOSIS — I10 Essential (primary) hypertension: Secondary | ICD-10-CM | POA: Diagnosis not present

## 2016-02-26 DIAGNOSIS — H16229 Keratoconjunctivitis sicca, not specified as Sjogren's, unspecified eye: Secondary | ICD-10-CM

## 2016-02-26 NOTE — Assessment & Plan Note (Addendum)
A1C in June 8.3 up from 7.4. Had to decrease liraglutide from 1.2 to 0.6 but now tolerating 1.2. Trying to remember to take QD. Keeps at desk to inject with breakfast. Working with the wellness grp - seeing them in Aug. Not walking QD. Sees optho yrly.  PLAN:  Cont current meds Sept appt for A1C

## 2016-02-26 NOTE — Assessment & Plan Note (Signed)
The liraglutide does decrease appetite in later day. Not walking. Did lose a few lbs. 327 - 322.  PLAN : cont to follow

## 2016-02-26 NOTE — Assessment & Plan Note (Signed)
On valsartan 160 and HCTZ 25. BP high on arrival but great on recheck.  BP Readings from Last 3 Encounters:  02/26/16 136/105  01/16/16 128/76  12/10/15 148/74   PLAN:  Cont current meds

## 2016-02-26 NOTE — Assessment & Plan Note (Signed)
All sxs resolved. On Flonase PRN.

## 2016-02-26 NOTE — Assessment & Plan Note (Signed)
Has new mask that is fantastic. Covers nose and mouth. Uses about 4 nights per week. Sleeps much better with the mask.

## 2016-02-26 NOTE — Assessment & Plan Note (Signed)
No sxs on protonix 40. No SE.  PLAN:  Cont current meds

## 2016-02-26 NOTE — Progress Notes (Signed)
   Subjective:    Patient ID: Denise Stevens, female    DOB: [birth year], 50 y.o.   MRN: [id number]  HPI  Denise Stevens is here for DM F/U. Please see the A&P for the status of the pt's chronic medical problems.  ROS : per ROS section and in problem oriented charting. All other systems are negative.  PMHx, Soc hx, and / or Fam hx : Engaged, a lot of stress in life. Mother with kidney dz. Personal PMHx DM, HTN, obesity, OSA.  Review of Systems  Constitutional: Negative for activity change, appetite change and unexpected weight change.  HENT: Negative for congestion and rhinorrhea.   Eyes: Negative for redness.       Dry eyes  Respiratory:       Sleeping better with CPAP  Endocrine: Positive for heat intolerance.  Musculoskeletal: Positive for back pain and arthralgias.  Skin:       multiple skin tags itching  Psychiatric/Behavioral: Positive for sleep disturbance and decreased concentration.       Objective:   Physical Exam  Constitutional: She is oriented to person, place, and time. She appears well-developed and well-nourished. No distress.  HENT:  Head: Normocephalic and atraumatic.  Right Ear: External ear normal.  Left Ear: External ear normal.  Nose: Nose normal.  Musculoskeletal: Normal range of motion.  Neurological: She is alert and oriented to person, place, and time.  Skin: Skin is warm. She is diaphoretic.  Multiple skin tags - some pedunculated, some flat  Psychiatric: She has a normal mood and affect. Her behavior is normal. Judgment and thought content normal.          Assessment & Plan:

## 2016-02-26 NOTE — Assessment & Plan Note (Signed)
On lexapro and wellbutrin. "Doesn't know if even working anymore." Stress with mother, fiance, kids. Working with Dietitian. Has good days and bad days. Some decreased concentration sometimes. To tell me when bad days outnumber good days.  PLAN:  Cont current meds

## 2016-02-26 NOTE — Assessment & Plan Note (Signed)
I refilled her eye drops last week. Sees optho yrly.

## 2016-02-26 NOTE — Patient Instructions (Signed)
See me in september

## 2016-02-26 NOTE — Assessment & Plan Note (Signed)
No SE to prava 40. LDL 89 in Feb,  PLAN:  Cont current meds

## 2016-02-26 NOTE — Assessment & Plan Note (Addendum)
Had completed one course of Vit D 50,000 weekly. Most recent level 16. Wants to take OTC  C/o skin tags on neck which are bothersome and itchy. All skin tags are minimal 2 mm or less.  PROCEDURE NOTE Area cleansed with betadine. Freezing spray used to numb area. 3 skin tags ID'd by pt, were individually grasped with tweasers and cut using scissors under sterile conditions. One skin tag site bleed a but and band aid applied. No complications.

## 2016-02-27 ENCOUNTER — Other Ambulatory Visit: Payer: Self-pay | Admitting: Internal Medicine

## 2016-02-27 LAB — VITAMIN D 25 HYDROXY (VIT D DEFICIENCY, FRACTURES): Vit D, 25-Hydroxy: 24.9 ng/mL — ABNORMAL LOW (ref 30.0–100.0)

## 2016-02-27 MED ORDER — ERGOCALCIFEROL 1.25 MG (50000 UT) PO CAPS: 50000.0000 [IU] | ORAL_CAPSULE | ORAL | Status: DC

## 2016-02-27 MED FILL — VIT D2 1.25 MG (50,000 UNIT: 1.25 MG | 42 days supply | Qty: 6 | Fill #0

## 2016-03-05 MED FILL — ESCITALOPRAM 10 MG TABLET: 10 | 90 days supply | Qty: 90 | Fill #2

## 2016-03-08 DIAGNOSIS — G4733 Obstructive sleep apnea (adult) (pediatric): Secondary | ICD-10-CM | POA: Diagnosis not present

## 2016-03-09 ENCOUNTER — Other Ambulatory Visit: Payer: Self-pay | Admitting: *Deleted

## 2016-03-10 ENCOUNTER — Other Ambulatory Visit: Payer: Self-pay | Admitting: *Deleted

## 2016-03-10 MED ORDER — BUPROPION HCL ER (XL) 150 MG PO TB24: 150.0000 mg | ORAL_TABLET | Freq: Every day | ORAL | 3 refills | Status: DC

## 2016-03-10 MED ORDER — PANTOPRAZOLE SODIUM 40 MG PO TBEC: 40.0000 mg | DELAYED_RELEASE_TABLET | Freq: Every day | ORAL | 3 refills | Status: DC

## 2016-03-10 MED ORDER — VALSARTAN-HYDROCHLOROTHIAZIDE 160-25 MG PO TABS: 1.0000 | ORAL_TABLET | Freq: Every day | ORAL | 3 refills | Status: DC

## 2016-03-10 MED FILL — VALSARTAN-HCTZ 160-25 MG TA: 160-25 | 90 days supply | Qty: 90 | Fill #0

## 2016-03-10 MED FILL — BUPROPION HCL XL 150 MG TAB: 150 | 90 days supply | Qty: 90 | Fill #0

## 2016-03-10 MED FILL — PANTOPRAZOLE SOD DR 40 MG T: 40 | 90 days supply | Qty: 90 | Fill #0

## 2016-03-19 ENCOUNTER — Ambulatory Visit: Payer: Self-pay | Admitting: Pharmacist

## 2016-03-23 ENCOUNTER — Other Ambulatory Visit: Payer: Self-pay | Admitting: Internal Medicine

## 2016-03-23 DIAGNOSIS — Z1231 Encounter for screening mammogram for malignant neoplasm of breast: Secondary | ICD-10-CM

## 2016-04-01 ENCOUNTER — Ambulatory Visit (HOSPITAL_COMMUNITY)
Admission: RE | Admit: 2016-04-01 | Discharge: 2016-04-01 | Disposition: A | Discharge status: Home / Self Care | Payer: 59 | Source: Ambulatory Visit | Attending: Sports Medicine | Admitting: Sports Medicine

## 2016-04-01 ENCOUNTER — Encounter: Payer: Self-pay | Admitting: Sports Medicine

## 2016-04-01 ENCOUNTER — Ambulatory Visit (INDEPENDENT_AMBULATORY_CARE_PROVIDER_SITE_OTHER): Payer: 59 | Admitting: Sports Medicine

## 2016-04-01 VITALS — BP 139/84 | Ht 65.5 in | Wt 325.0 lb

## 2016-04-01 DIAGNOSIS — M67912 Unspecified disorder of synovium and tendon, left shoulder: Secondary | ICD-10-CM | POA: Diagnosis not present

## 2016-04-01 DIAGNOSIS — M7062 Trochanteric bursitis, left hip: Secondary | ICD-10-CM

## 2016-04-01 DIAGNOSIS — M25512 Pain in left shoulder: Secondary | ICD-10-CM

## 2016-04-01 DIAGNOSIS — M542 Cervicalgia: Secondary | ICD-10-CM

## 2016-04-01 DIAGNOSIS — M50322 Other cervical disc degeneration at C5-C6 level: Secondary | ICD-10-CM | POA: Insufficient documentation

## 2016-04-01 DIAGNOSIS — M19012 Primary osteoarthritis, left shoulder: Secondary | ICD-10-CM | POA: Diagnosis not present

## 2016-04-01 DIAGNOSIS — M2578 Osteophyte, vertebrae: Secondary | ICD-10-CM | POA: Diagnosis not present

## 2016-04-01 DIAGNOSIS — M7061 Trochanteric bursitis, right hip: Secondary | ICD-10-CM

## 2016-04-01 DIAGNOSIS — M5010 Cervical disc disorder with radiculopathy, unspecified cervical region: Secondary | ICD-10-CM | POA: Diagnosis not present

## 2016-04-01 DIAGNOSIS — M47812 Spondylosis without myelopathy or radiculopathy, cervical region: Secondary | ICD-10-CM | POA: Diagnosis not present

## 2016-04-01 MED ORDER — METHYLPREDNISOLONE ACETATE 40 MG/ML IJ SUSP
40.0000 mg | Freq: Once | INTRAMUSCULAR | Status: AC
  Administered 2016-04-01: 40 mg via INTRA_ARTICULAR

## 2016-04-01 MED ORDER — GABAPENTIN 300 MG PO CAPS: 300.0000 mg | ORAL_CAPSULE | Freq: Every day | ORAL | 2 refills | Status: DC

## 2016-04-01 MED FILL — GABAPENTIN 300 MG CAPSULE: 300 | 30 days supply | Qty: 30 | Fill #0

## 2016-04-01 NOTE — Assessment & Plan Note (Addendum)
Patient having signs and symptoms of cervical spine disease.  Appear to be along the C6 region. Will get x-rays of the cervical spine.  Will start on gabapentin 300 mg QHS

## 2016-04-01 NOTE — Progress Notes (Signed)
Denise Stevens - 50 y.o. female MRN [id number]  Date of birth: Mar 14, 1966  SUBJECTIVE:  Including CC & ROS.  CC: left shoulder pain  Patient comes in with left should been bothering her for the past couple months.  She has had it injected previously in January and had a lot of relief for a few months.  She has a history of rotator cuff surgery and acromioplasty on her right side. She was told she had a hooked acromion.  She also complains of numbness and tingling along her left arm into her fifth digit.  Denies any weakness of her UE, except secondary to pain.  She states that her shoulder pain is the worst pain.  She also complains of bilateral lateral hip pain that has been ongoing for past couple months.      ROS: No unexpected weight loss, fever, chills, swelling, instability, muscle pain, redness, otherwise see HPI   PMHx - Updated and reviewed.  Contributory factors include: Diabetes, HTN, obesity PSHx - Updated and reviewed.  Contributory factors include:  Right rotator cuff repair with acromioplasty FHx - Updated and reviewed.  Contributory factors include:  Negative Social Hx - Updated and reviewed. Contributory factors include: Negative Medications - reviewed, takes aleve  DATA REVIEWED: Previous office visits  PHYSICAL EXAM:  VS: BP:139/84  HR: bpm  TEMP: ( )  RESP:   HT:5' 5.5" (166.4 cm)   WT:(!) 325 lb (147.4 kg)  BMI:53.4 PHYSICAL EXAM: Gen: NAD, alert, cooperative with exam, well-appearing HEENT: clear conjunctiva,  CV:  no edema, capillary refill brisk, normal rate Resp: non-labored Skin: no rashes, normal turgor  Neuro: no gross deficits.  Psych:  alert and oriented Neck: Inspection unremarkable. No palpable stepoffs. Negative Spurling's maneuver. Neck range of motion limited on sidebeding and rotating to left secondary to pain Grip strength normal in bilateral hands, decreased sensation on left medial forearm Strength good C4 to T1 distribution Negative  Hoffman sign bilaterally Reflexes normal  Shoulder: Inspection reveals no abnormalities, atrophy or asymmetry. Palpation is with tenderness along posterior trapezius and supraspinatus, mild AC joint tenderness, no TTP in bicipital groove. ROM is full in all planes, but has pain with abduction. Rotator cuff strength normal throughout. + signs of impingement with +Neer and Hawkin's tests, empty can sign. Speeds and Yergason's tests normal. No labral pathology noted with negative Obrien's, negative clunk and good stability. No painful arc and no drop arm sign.      ASSESSMENT & PLAN:   Tendinopathy of rotator cuff Injection of left subacromial space, pt noted relief.  Follow up 3 weeks.  Can consider PT.  May also have cervical spine disease.  We will get x-rays of the shoulder to check for arthritis and acromion type.  Cervical disc disorder with radiculopathy of cervical region Patient having signs and symptoms of cervical spine disease.  Appear to be along the C6 region. Will get x-rays of the cervical spine.    Greater trochanteric bursitis of both hips Discussed treatment modalities of the greater trochanter bursitis. She would like to hold off on treatment for now and focus on the shoulder neck. Can consider injections and physical therapy in the future.  Consent obtained and verified. Sterile betadine prep. Furthur cleansed with alcohol. Topical analgesic spray: Ethyl chloride. Joint: left subacromial space Approached in typical fashion with: posterior approach Completed without difficulty Meds: 40 mg depomedrol and 1 cc lidocaine 1% Needle: 25G Aftercare instructions and Red flags advised.   Addendum to above: X-rays  reviewed. Patient has moderately advanced degenerative disc disease in her cervical spine. If radiculopathy persists or worsens and I would consider an MRI to evaluate further. Her symptoms appear to be more consistent with C7/C8 radiculopathy; not the C6  radiculopathy mentioned by the fellow.

## 2016-04-01 NOTE — Assessment & Plan Note (Signed)
Discussed treatment modalities of the greater trochanter bursitis. She would like to hold off on treatment for now and focus on the shoulder neck. Can consider injections and physical therapy in the future.

## 2016-04-01 NOTE — Assessment & Plan Note (Addendum)
Injection of left subacromial space, pt noted relief.  Follow up 3 weeks.  Can consider PT.  May also have cervical spine disease.  We will get x-rays of the shoulder to check for arthritis and acromion type.

## 2016-04-02 ENCOUNTER — Other Ambulatory Visit: Payer: Self-pay | Admitting: Pharmacist

## 2016-04-02 ENCOUNTER — Encounter: Payer: Self-pay | Admitting: Pharmacist

## 2016-04-02 VITALS — BP 142/80 | Wt 319.0 lb

## 2016-04-02 DIAGNOSIS — E119 Type 2 diabetes mellitus without complications: Secondary | ICD-10-CM

## 2016-04-02 MED FILL — VICTOZA 18 MG/3 ML INJECT P: 18 | 90 days supply | Qty: 18 | Fill #2

## 2016-04-02 MED FILL — PRAVASTATIN NA 40 MG TAB: 40 | 90 days supply | Qty: 90 | Fill #1

## 2016-04-02 NOTE — Patient Outreach (Signed)
Subjective:  Patient presents today for 3 month diabetes follow-up as part of the employer-sponsored [employer] program.  Current diabetes regimen includes Victoza 0.6 mg SQ once daily.  Patient also continues on daily ARB and statin.  Most recent MD follow-up was in July with Dr. Lynnae Stevens.  Patient has a pending appt for October 2017.  No med changes at this time.   Patient had a visit with Dr. Micheline Stevens yesterday due to pain in her left shoulder. She received a steroid injection yesterday in her shoulder.    Assessment:  Diabetes: Most recent A1C was 8.3   % which is exceeding goal of less than 7%. Weight is decreased from last visit with me.    CBG Review: Patient has 4 checks on meter in the last 2 months.   CBG averages are higher than at last visit, but with so few CBG checks it is hard to know if this is truly higher than at last visit.   Explained to patient that with steroid injection yesterday, blood sugars were likely to be elevated for the next 2-3 weeks.   She is currently tolerating Victoza at 0.6 mg once daily. She had tried to titrate up to 1.2 mg SQ daily (as prescribed) a few months ago and didn't tolerate it. Given her recent steroid injection it may be worth it to try and increase to 1.2 mg daily. Patient had her Victoza pen with her and I showed her that she could start with 1 click past the 0.6 mg dose and increase 1 extra "click" at a time as she is able to tolerate it.   Lifestyle improvements:  Physical Activity-  She reports she hasn't been doing any additional physical activity. Some extra walking at work.   She states that she has been thinking about exercising. She has been seeing a counselour and her counselour has also recommended exercise for her. She did enter the exercise room at work and looked around but she felt intimidated. This is a big change for her- usually she avoids exercise and is resistant to making any goals to start physical activity.    She is interested in completing the step challenge through [employer].   Nutrition-  Because patient was late to appointment I did not have time to address this.    Follow up with me in 5 months when I return from maternity leave.    Plan/Goals for Next Visit:  1. Try increasing Victoza to 1.2 mg SQ daily.  2. Pack a bag with exercise clothes and shoes so that you are able to go and use the exercise bike at the employee gym. Remember, exercise can be a great stress relief. Exercising is a way to take care of yourself.  3. Physical Activity goal- twice a week for 15 minutes. Work your way up to longer.     Next appointment to see me is: Monday February 5th at 8:30 AM   Denise Stevens, PharmD, BCPS, CDE [facility] to [name] Coordinator [phone]

## 2016-04-05 ENCOUNTER — Telehealth: Payer: Self-pay | Admitting: Sports Medicine

## 2016-04-05 NOTE — Telephone Encounter (Signed)
I spoke with the patient on the phone today after reviewing x-rays of her left shoulder and cervical spine. X-ray of the left shoulder shows some advanced degenerative changes at the acromioclavicular joint but I believe this to be incidental. I believe her pain generator is the degenerative disc disease at C5-C6 and C6-C7 which is seen on her cervical spine x-ray. She is still getting pain and tingling into her left arm. She is asking about chiropractic treatment or osteopathic manipulation. I think she is okay to try either one of those. She has yet to start her gabapentin and I strongly encouraged her to do this as well. I explained to her that if her symptoms persist, then my next step in treatment would be to get an MRI of her cervical spine in anticipation of sending her for diagnostic/therapeutic cervical ESI's. She understands. I will wait to hear back from her.

## 2016-04-06 ENCOUNTER — Ambulatory Visit (INDEPENDENT_AMBULATORY_CARE_PROVIDER_SITE_OTHER): Payer: 59 | Admitting: Internal Medicine

## 2016-04-06 ENCOUNTER — Ambulatory Visit: Payer: 59

## 2016-04-06 VITALS — BP 122/77 | HR 103 | Temp 98.0°F | Ht 65.5 in | Wt 326.0 lb

## 2016-04-06 DIAGNOSIS — M2170 Unequal limb length (acquired), unspecified site: Secondary | ICD-10-CM

## 2016-04-06 DIAGNOSIS — M5010 Cervical disc disorder with radiculopathy, unspecified cervical region: Secondary | ICD-10-CM

## 2016-04-07 NOTE — Assessment & Plan Note (Signed)
Physical exam revealed significant somatic dysfunction of her cervical paraspinal muscles, BL trapezius and first rib.  -Soft tissue OMT performed on the patients cervical paraspinal muscles, trapezius and elevated first rib. Patient noted significant improvement of her overall pain and left arm numbness both during and after treatment. -I agree with the sports medicine physician in regards to trying a cervical traction collar to help counteract her cervical disc space loss.  -Encouraged soaking in an epsom salt bath to help further relax her muscle spasms -Encouraged increasing her water intake throughout the next several days -Would like to see patient back in 1 week for follow up and further treatment
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
## 2016-04-09 NOTE — Progress Notes (Signed)
I saw and evaluated the patient. I personally confirmed the key portions of Dr. Molt's history and exam and reviewed pertinent patient test results. The assessment, diagnosis, and plan were formulated together and I agree with the documentation in the resident's note. 

## 2016-04-21 ENCOUNTER — Ambulatory Visit: Payer: 59 | Admitting: Sports Medicine

## 2016-04-23 ENCOUNTER — Telehealth: Payer: Self-pay | Admitting: Dietician

## 2016-04-23 ENCOUNTER — Other Ambulatory Visit (INDEPENDENT_AMBULATORY_CARE_PROVIDER_SITE_OTHER): Payer: 59

## 2016-04-23 ENCOUNTER — Ambulatory Visit
Admission: RE | Admit: 2016-04-23 | Discharge: 2016-04-23 | Disposition: A | Discharge status: Home / Self Care | Payer: 59 | Source: Ambulatory Visit | Attending: Sports Medicine | Admitting: Sports Medicine

## 2016-04-23 ENCOUNTER — Ambulatory Visit (INDEPENDENT_AMBULATORY_CARE_PROVIDER_SITE_OTHER): Payer: 59 | Admitting: Sports Medicine

## 2016-04-23 ENCOUNTER — Encounter: Payer: Self-pay | Admitting: Sports Medicine

## 2016-04-23 ENCOUNTER — Other Ambulatory Visit: Payer: Self-pay | Admitting: Internal Medicine

## 2016-04-23 VITALS — BP 117/52 | HR 92 | Ht 65.0 in | Wt 319.0 lb

## 2016-04-23 DIAGNOSIS — E119 Type 2 diabetes mellitus without complications: Secondary | ICD-10-CM

## 2016-04-23 DIAGNOSIS — E1165 Type 2 diabetes mellitus with hyperglycemia: Secondary | ICD-10-CM | POA: Diagnosis not present

## 2016-04-23 DIAGNOSIS — M5010 Cervical disc disorder with radiculopathy, unspecified cervical region: Secondary | ICD-10-CM

## 2016-04-23 DIAGNOSIS — M50223 Other cervical disc displacement at C6-C7 level: Secondary | ICD-10-CM | POA: Diagnosis not present

## 2016-04-23 LAB — POCT GLYCOSYLATED HEMOGLOBIN (HGB A1C): Hemoglobin A1C: 8.4

## 2016-04-23 LAB — GLUCOSE, CAPILLARY: Glucose-Capillary: 95 mg/dL (ref 65–99)

## 2016-04-23 MED ORDER — GLIPIZIDE 5 MG PO TABS: 5.0000 mg | ORAL_TABLET | Freq: Every day | ORAL | 0 refills | Status: DC

## 2016-04-23 MED ORDER — PREDNISONE 10 MG (21) PO TBPK: ORAL_TABLET | ORAL | 0 refills | Status: DC

## 2016-04-23 MED FILL — glipiZIDE 5 MG TABS: 5 | 30 days supply | Qty: 60 | Fill #0

## 2016-04-23 MED FILL — predniSONE 10 MG TABS: 10 | 6 days supply | Qty: 21 | Fill #0

## 2016-04-23 NOTE — Progress Notes (Signed)
   Subjective:    Patient ID: Denise Stevens, female    DOB: July 12, 1966, 50 y.o.   MRN: [id number]  HPI   Patient comes in today with persistent left arm pain. Recent x-ray of her cervical spine showed degenerative disc disease at C5-C6 and C6-C7. She has tried Engineer, drilling which has been somewhat helpful. The numbness and tingling that she was getting in the ulnar aspect of her hand has resolved but she is still getting numbness and tingling throughout the rest of her hand. She has also continued to experience a chronic achy discomfort in the forearm and hand. Her left shoulder pain resolved with a recent subacromial cortisone injection. She is taking gabapentin and Skelaxin but has noticed that the gabapentin is causing some hair loss. However, since the gabapentin is helping, she has elected to continue with it. She denies any weakness. She is getting pain at night.    Review of Systems     Objective:   Physical Exam  Well-developed, well-nourished. No acute distress. Awake alert and oriented 3.  Cervical spine: Good cervical range of motion. No tenderness along cervical midline.  Neurological exam: Patient has 4+/5 strength with resisted triceps extension on the left compared to 5/5 on the right. Remainder of her strength is 5/5 in both upper extremities. She has a 1/4 triceps reflex on the left compared to 3/4 on the right. Biceps and brachial radialis reflexes bilaterally are 3/4. No atrophy. Good radial and ulnar pulses.  X-rays of the cervical spine are as above      Assessment & Plan:   Left arm pain secondary to cervical radiculopathy  MRI of the cervical spine in anticipation of referring her for cervical ESI's. Patient would like to continue on her gabapentin despite recent hair loss. She is asking if she can take a higher dose. I've explained to her that she can try taking either 600 mg at night or 300 mg twice a day but I did caution her about drowsiness if  she takes it during the day. We will also place her on a 6 day Sterapred Dosepak (patient is a known diabetic and her primary care physician is aware that we are doing this). Phone follow-up early next week to discuss MRI findings of her cervical spine and further discuss possible cervical ESI's.

## 2016-04-23 NOTE — Assessment & Plan Note (Signed)
To start steroid. Staring protocol. 1. Check CBG at least 2 times a day, before first 2 meals of day 2. Once 2 CBG's are higher than 200, start med  Glucotrol (glipizide) 5 mg in morning 3. If CBG's are not less than 200 after 24 hours, increase Glucotrol from 5 mg to 10 mg in morning 4. Butch Penny or Dr Maudie Mercury will call you Monday

## 2016-04-23 NOTE — Telephone Encounter (Signed)
Steroid-Induced Hyperglycemia Prevention and Management Denise Stevens is a 50 y.o. female who meets criteria for [facility] quality improvement program (diabetes patient prescribed short course of steroids).  A/P Current Regimen  Patient prescribed prednisone 6 day sterapred dose pack  currently on day 0 of therapy. Patient asked to  take prednisone in the AM  Prednisone indication   Current DM regimen victoza  DM regimen prior to steroid course same  Home BG Monitoring  Patient does have a meter at home and does check BG at home. Meter was not supplied.  CBGs at home 95  CBGs prior to steroid course ~ 190-200 per A!C, A1C prior to steroid course 8.4  S/Sx of hyper- or hypoglycemia: none, none  Medication Management  Switch prednisone dose to AM yes  Additional treatment for BG control is not indicated at this time.  Glipizide 5mg  given to patient by her doctor to use if CBGs x2 >200 over weekend and told to increase it to 10 mg if needed ( if 2 CBGs > 200 on 5 mg glipizide)    Patient Education  Advised patient to monitor BG while on steroid therapy (at least twice daily prior to first 2 meals of the day).  Patient educated about signs/symptoms and advised to contact clinic if hyper- or hypoglycemic.  Patient did  verbalize understanding of information and regimen by repeating back topics discussed.  Follow-up Monday Sept 18, 2017  none scheduled  Plyler, Butch Penny 4:48 PM 04/23/2016

## 2016-04-26 ENCOUNTER — Encounter: Payer: Self-pay | Admitting: Sports Medicine

## 2016-04-26 NOTE — Telephone Encounter (Signed)
Called to follow up on steroid induced hyperglycemia:  Today is day 3 on Sterapred  dose pack.  Patient reports that her blood sugars were 225-223 this weekend and >200 at 5 AM today. She started 1/2 tablet of 5 mg glipizide (2.5mg ) this am. When she checked before lunch today and her blood sugar was 189.  Encouraged her  to continue checking her blood sugar before meals.  P follow up by phone on September 19.

## 2016-04-27 ENCOUNTER — Other Ambulatory Visit: Payer: Self-pay | Admitting: *Deleted

## 2016-04-27 ENCOUNTER — Telehealth: Payer: Self-pay | Admitting: Sports Medicine

## 2016-04-27 DIAGNOSIS — M5010 Cervical disc disorder with radiculopathy, unspecified cervical region: Secondary | ICD-10-CM

## 2016-04-27 NOTE — Telephone Encounter (Signed)
I spoke with Denise Stevens on the phone yesterday after reviewing the MRI of her cervical spine. She does have evidence of left-sided foraminal stenosis at C5-C6 and C6-C7. She also has changes consistent with possible chronic inflammatory demyelinating polyneuropathy (CIDP). Incidental finding of thyromegaly is also noted by the radiologist. I would like to go ahead and arrange for a diagnostic/therapeutic cervical ESI. I would also like for the patient to see neurology for further evaluation and treatment of her possible CIDP. Denise Stevens will follow-up with her primary care physician, Dr. Lynnae January, about further workup of her thyromegaly. I've asked Sreeya to call me a few days after her cervical ESI to give me an update on her progress.

## 2016-04-27 NOTE — Telephone Encounter (Signed)
Today is day 4 of prednisone  I think I finish on Thursday.  2 more days is correct.     I did take the 2.5 glipizide when I took my prednisone this morning.  When I finish the prednisone I will stop the glipizide.    Thanks Butch Penny for your help.  Santiago Glad    From: Spenser Cong, Joselyn Glassman: Tuesday, April 27, 2016 2:25 PM To: Blanch [email or web address]> Subject: RE: CBGs  Wow! Good job!  Looks like you are good with the 2.5 mg glipizide while on steroids.   Did  you take 2.5mg  glipizide (  tablet)  along with your prednisone this am?   Is today day 4 of the steroid pack? And You are to take it for 2 more days?   I think you only need to take the glipizide when you take the steroids. I would stop the glipizide once you stop the steroids. Your sugar may be slightly elevated for 24 hours after the steroids, but if you took the glipizide I am afraid your sugar may be too low.   Can you please answer me back answering my questions above?  Thank you and thank you for your email!  Butch Penny  From: Blanch Media  Sent: Tuesday, April 27, 2016 1:51 PM To: Jada Fass, Butch Penny [email or web address]> Subject: CBGs  8:15 90  12: 15 92

## 2016-04-28 ENCOUNTER — Other Ambulatory Visit: Payer: Self-pay | Admitting: Sports Medicine

## 2016-04-28 DIAGNOSIS — M5010 Cervical disc disorder with radiculopathy, unspecified cervical region: Secondary | ICD-10-CM

## 2016-04-29 NOTE — Telephone Encounter (Signed)
Emails from and to patient about Steroid Induced Hyperglycemia:    Yes I am comfortable.  I do agree that they are under control now and I can stop the glipizide.    Enjoy your weekend.    Thank you for all your help.     Blanch Media   From: Indianna Boran, Joselyn Glassman: Thursday, April 29, 2016 12:37 PM To: Blanch Media [email or web address]> Subject: RE: CBG update? 9-20/21  Sounds like you are in good shape to stop your glipizide as of today- none tomorrow. Would you agree?   Delsa Sale and I are both off tomorrow. Are you comfortable with the transition back to your usual diabetes routine?   Thank you for your email!  Butch Penny  From: Blanch Media  Sent: Thursday, April 29, 2016 10:53 AM To: Ayonna Speranza, [email or web address]> Cc: Larey [email or web address]> Subject: RE: CBG update? 9-20/21  Good Morning Butch Penny,  I did not get a chance to take my CBG before my supper last night.    Last night's CBG before bed was 192 / This morning's CBG fasting was 86  I took my last prednisone this morning and 2.5 of Glipizide.   Butch Penny that will be fine.  Dr. Lynnae January had said you and Dr. Maudie Mercury would be following me.    Email is fine if that is ok with you.    169 @ 12n 215 @ 4:06 pm yesterday 92 @ 12:09 pm yesterday 90  @ 8:14 am yesterday   My prednisone will be done tomorrow.  So I call [place] and schedule and 2 week follow-up with you or Dr. Lynnae January?   Thanks,   [place]  [facility] Res[facility]  GME Coordinator Direct Dial: [phone]  Fax: [phone]  Website: [email or web address]  "Fight Like A Girl"  From: PlylerButch Penny  Sent: Wednesday, April 28, 2016 3:19 PM To: Blanch Media [email or web address]> Subject: CBG update?   Hi Ashtan,  Would like to follow your CBGs until you are off the prednisone if okay with you. We also usually ask pts to schedule in 1-2 weeks  after stopping prednisone but you can discuss with Dr. Lynnae January.   Would you like to update me by email or phone? would want any readings form last nights and today.  Thank you!  Chauncey Reading. Juaquina Machnik MEd, RD, LDN, CDE, [facility]

## 2016-04-30 ENCOUNTER — Encounter: Payer: Self-pay | Admitting: Sports Medicine

## 2016-04-30 ENCOUNTER — Other Ambulatory Visit: Payer: Self-pay | Admitting: Internal Medicine

## 2016-04-30 MED ORDER — GABAPENTIN 300 MG PO CAPS: 300.0000 mg | ORAL_CAPSULE | Freq: Two times a day (BID) | ORAL | 0 refills | Status: DC

## 2016-04-30 MED FILL — GABAPENTIN 300 MG CAPSULE: 300 | 30 days supply | Qty: 60 | Fill #0

## 2016-04-30 NOTE — Progress Notes (Signed)
Gaba 300 was increased from 300 QHS to BID so running out. Needs to leave work early today and only has enough to get her through Sunday. Since short notice, unable to get from [facility] before leaving. I filled #[phone] but no refills.

## 2016-05-06 ENCOUNTER — Ambulatory Visit
Admission: RE | Admit: 2016-05-06 | Discharge: 2016-05-06 | Disposition: A | Discharge status: Home / Self Care | Payer: 59 | Source: Ambulatory Visit | Attending: Sports Medicine | Admitting: Sports Medicine

## 2016-05-06 DIAGNOSIS — M50222 Other cervical disc displacement at C5-C6 level: Secondary | ICD-10-CM | POA: Diagnosis not present

## 2016-05-06 DIAGNOSIS — M5010 Cervical disc disorder with radiculopathy, unspecified cervical region: Secondary | ICD-10-CM

## 2016-05-06 MED ORDER — IOPAMIDOL (ISOVUE-M 300) INJECTION 61%
1.0000 mL | Freq: Once | INTRAMUSCULAR | Status: AC | PRN
  Administered 2016-05-06: 1 mL via EPIDURAL

## 2016-05-06 MED ORDER — TRIAMCINOLONE ACETONIDE 40 MG/ML IJ SUSP (RADIOLOGY)
60.0000 mg | Freq: Once | INTRAMUSCULAR | Status: AC
  Administered 2016-05-06: 60 mg via EPIDURAL

## 2016-05-06 MED ORDER — DIAZEPAM 5 MG PO TABS
10.0000 mg | ORAL_TABLET | Freq: Once | ORAL | Status: AC
  Administered 2016-05-06: 10 mg via ORAL

## 2016-05-06 NOTE — Discharge Instructions (Signed)

## 2016-05-11 ENCOUNTER — Telehealth: Payer: Self-pay | Admitting: Neurology

## 2016-05-11 ENCOUNTER — Ambulatory Visit (INDEPENDENT_AMBULATORY_CARE_PROVIDER_SITE_OTHER): Payer: 59 | Admitting: Neurology

## 2016-05-11 ENCOUNTER — Encounter: Payer: Self-pay | Admitting: Neurology

## 2016-05-11 VITALS — BP 131/82 | HR 82 | Ht 65.5 in | Wt 322.4 lb

## 2016-05-11 DIAGNOSIS — G6181 Chronic inflammatory demyelinating polyneuritis: Secondary | ICD-10-CM | POA: Diagnosis not present

## 2016-05-11 DIAGNOSIS — G544 Lumbosacral root disorders, not elsewhere classified: Secondary | ICD-10-CM | POA: Diagnosis not present

## 2016-05-11 DIAGNOSIS — R29898 Other symptoms and signs involving the musculoskeletal system: Secondary | ICD-10-CM

## 2016-05-11 DIAGNOSIS — D8989 Other specified disorders involving the immune mechanism, not elsewhere classified: Secondary | ICD-10-CM

## 2016-05-11 DIAGNOSIS — G629 Polyneuropathy, unspecified: Secondary | ICD-10-CM

## 2016-05-11 DIAGNOSIS — M5010 Cervical disc disorder with radiculopathy, unspecified cervical region: Secondary | ICD-10-CM

## 2016-05-11 DIAGNOSIS — G542 Cervical root disorders, not elsewhere classified: Secondary | ICD-10-CM | POA: Diagnosis not present

## 2016-05-11 DIAGNOSIS — M5440 Lumbago with sciatica, unspecified side: Secondary | ICD-10-CM

## 2016-05-11 DIAGNOSIS — R202 Paresthesia of skin: Secondary | ICD-10-CM | POA: Diagnosis not present

## 2016-05-11 DIAGNOSIS — M359 Systemic involvement of connective tissue, unspecified: Secondary | ICD-10-CM

## 2016-05-11 NOTE — Telephone Encounter (Signed)
PT STATES SHE IS TO HAVE AN MRI AND NEEDS IT TO BE AN OPEN MRI.    CB

## 2016-05-11 NOTE — Progress Notes (Addendum)
[facility]    Provider:  Dr Jaynee Eagles Referring Provider: Lilia Argue, MD Primary Care Physician:  Larey Dresser, MD  CC:  Arm pain and weakness  Addendum 06/09/2016: Repeat MRI of the cervical spine and lumbar spine not consistent with CIDP or inflammatory process, the cervical enlarged nerve roots do not appear to be infectious, inflammatory or neoplastic. EMG/NCS c/w acute on chronic cervical radiculopathy, ESI x 1 was not successful will refer to Orthopaedics Dr. Melina Schools for evaluation of surgical procedures as her mri results and symptoms c/w radiculopathy  HPI:  Denise Stevens is a 50 y.o. female here as a referral from Dr. Lynnae January for cervical radiculopathy. She is aching, her left hand is sore, it all started 8 weeks ago without inciting event or trauma. She started having numbness and tinglingin the left hand >> right. A lot of sensory sensitivity in the left hand. No neck pain, no radicular pain. She feel her arm is weak but she is not dropping objects. The symptoms are in digits 1-3 mostly. She has pain in the palm. Dull ache. She wakes up with pain in the hand and the entire arm goes limp and tingly in the morning however she sleeps on her left side. Symptoms are worsening. Using her left hand makes it worse. She sleeps on her left side which exacerbates it. No weakness in the legs or leg symptoms. No problems climbing steps or getting out of low seats or walking or holding arms overhead. No double vision or headaches. No difficulty swallowing or facial weakness. No other associated symptoms or modifying factors. She does have occ LBP and sharp pain into her legs on valsalva that is severe and shoots across the back. No FHx of neuromuscular disorder.   Reviewed notes, labs and imaging from outside physicians, which showed:   Hgba1c 8.4 04/2016  COMPARISON:  Plain films 04/01/2016.  Personally reviewed MRI of the cervical spine images and agree with the  following: Alignment: Straightening of the normal cervical lordosis. No subluxation.  Vertebrae: Diffuse low signal intensity bone marrow, may also involve the clivus. This could be related to chronic disease (diabetes mellitus) cysts and/or body habitus (morbid obesity with weight of 319 pounds). No areas of concern for focal marrow replacement.  Cord: Mild cord flattening due spondylosis at C5-6. No abnormal cord signal.  Posterior Fossa, vertebral arteries, paraspinal tissues: No tonsillar herniation. Flow voids are maintained in both vertebrals. Both lobes of the thyroid gland are enlarged, worse on the LEFT. Thyroid sonography recommended for further characterization.  Disc levels:  C2-3:  Unremarkable.  C3-4:  Central and leftward protrusion.  No definite impingement.  C4-5: Central protrusion. RIGHT-sided foraminal narrowing could affect the C5 nerve root.  C5-6: Central extrusion with osseous ridging. Mild cord flattening. BILATERAL C6 nerve root impingement.  C6-7: Central and leftward protrusion. LEFT C7 nerve root impingement.  C7-T1:  Central protrusion.  No stenosis or foraminal narrowing.  In the paraspinous soft tissues, see for instance sagittal images 1 and 14, the cervical nerve roots are enlarged. Chronic inflammatory demyelinating polyneuropathy is suspected. Post infusion imaging of the cervical spine is recommended, along with lumbar MRI without and with contrast.  IMPRESSION: Suspected CIDP (chronic inflammatory demyelinating polyneuropathy).Recommend post infusion imaging of the cervical spine along with lumbar MRI without and with contrast for further evaluation.  Multilevel spondylosis as described. Potentially symptomatic stenosis and neural impingement most likely at C5-C6.  Diffusely low signal intensity bone marrow of uncertain significance could be  related to body habitus.  Thyroid enlargement, incompletely evaluated. Recommend thyroid  ultrasound.   Review of Systems: Patient complains of symptoms per HPI as well as the following symptoms: No CP, no SOB. Pertinent negatives per HPI. All others negative.   Social History   Social History  . Marital status: Married    Spouse name: N/A  . Number of children: 2  . Years of education:  Some college   Occupational History  . [employer]    Social History Main Topics  . Smoking status: Never Smoker  . Smokeless tobacco: Never Used  . Alcohol use 0.0 oz/week     Comment: wine- very rarely  . Drug use: No  . Sexual activity: Not on file   Other Topics Concern  . Not on file   Social History Narrative   Lives with 33 y/o son and boyfriend (05/11/16)   Caffeine use: daily - 20oz diet mtn dew    Family History  Problem Relation Age of Onset  . Cancer Father     prostate  . Hyperlipidemia Mother   . Hypertension Mother   . Kidney disease Mother   . Diabetes Mother   . Cancer Mother     breast  . Obesity Sister     s/p bypass  . Heart attack Neg Hx     Past Medical History:  Diagnosis Date  . Asthma    no intubation or hospitalization hx  . Depression   . Diabetes mellitus   . GERD (gastroesophageal reflux disease)   . HTN (hypertension)   . Hyperlipidemia     Past Surgical History:  Procedure Laterality Date  . ABDOMINAL HYSTERECTOMY    . COLONOSCOPY WITH PROPOFOL N/A 04/29/2015   Procedure: COLONOSCOPY WITH PROPOFOL;  Surgeon: Juanita Craver, MD;  Location: [facility];  Service: Endoscopy;  Laterality: N/A;  . GANGLION CYST EXCISION     R wrist 2000  . MARSUPIALIZATION URETHRAL DIVERTICULUM     2012  . ROTATOR CUFF REPAIR     R 2000  . VESICOVAGINAL FISTULA CLOSURE W/ TAH     nov 07    Current Outpatient Prescriptions  Medication Sig Dispense Refill  . buPROPion (WELLBUTRIN XL) 150 MG 24 hr tablet Take 1 tablet (150 mg total) by mouth daily. 90 tablet 3  . cetirizine (ZYRTEC) 10 MG tablet Take 10 mg by mouth at bedtime as  needed for allergies.     . cycloSPORINE (RESTASIS) 0.05 % ophthalmic emulsion Place 1 drop into both eyes 2 (two) times daily. (Patient taking differently: Place 1 drop into both eyes as needed. ) 240 mL 3  . escitalopram (LEXAPRO) 10 MG tablet Take 1 tablet (10 mg total) by mouth every morning. 90 tablet 3  . fluticasone (FLONASE) 50 MCG/ACT nasal spray Place 2 sprays into both nostrils daily. (Patient taking differently: Place 2 sprays into both nostrils as needed. ) 48 g 3  . gabapentin (NEURONTIN) 300 MG capsule Take 1 capsule (300 mg total) by mouth 2 (two) times daily. (Patient taking differently: Take 300 mg by mouth as needed. ) 60 capsule 0  . glipiZIDE (GLUCOTROL) 5 MG tablet Take 1 tablet (5 mg total) by mouth daily before breakfast. 60 tablet 0  . glucose blood (TRUE METRIX BLOOD GLUCOSE TEST) test strip Use as instructed 100 each 11  . Insulin Pen Needle 32G X 6 MM MISC Use to inject victoza daily 100 each 3  . Liraglutide 18 MG/3ML SOPN Inject  0.1 mLs (0.6 mg total) into the skin daily. For one week then inject 1.2 mg daily. 18 mL 3  . metaxalone (SKELAXIN) 800 MG tablet Take 800 mg by mouth 3 (three) times daily.    . naproxen sodium (ANAPROX) 220 MG tablet Take 440 mg by mouth daily.    . pantoprazole (PROTONIX) 40 MG tablet Take 1 tablet (40 mg total) by mouth daily. 90 tablet 3  . pravastatin (PRAVACHOL) 40 MG tablet Take 1 tablet (40 mg total) by mouth daily. 90 tablet 3  . valsartan-hydrochlorothiazide (DIOVAN-HCT) 160-25 MG tablet Take 1 tablet by mouth daily. 90 tablet 3   No current facility-administered medications for this visit.     Allergies as of 05/11/2016 - Review Complete 05/11/2016  Allergen Reaction Noted  . Augmentin [amoxicillin-pot clavulanate] Diarrhea 04/29/2015  . Codeine Itching 04/02/2011  . Morphine and related Itching 04/02/2011    Vitals: BP 131/82 (BP Location: Right Arm, Patient Position: Sitting, Cuff Size: Large)   Pulse 82   Ht 5' 5.5"  (1.664 m)   Wt (!) 322 lb 6.4 oz (146.2 kg)   BMI 52.83 kg/m  Last Weight:  Wt Readings from Last 1 Encounters:  05/11/16 (!) 322 lb 6.4 oz (146.2 kg)   Last Height:   Ht Readings from Last 1 Encounters:  05/11/16 5' 5.5" (1.664 m)   Physical exam: Exam: Gen: NAD, conversant, well nourised, obese, well groomed                     CV: RRR, no MRG. No Carotid Bruits. No peripheral edema, warm, nontender Eyes: Conjunctivae clear without exudates or hemorrhage  Neuro: Detailed Neurologic Exam  Speech:    Speech is normal; fluent and spontaneous with normal comprehension.  Cognition:    The patient is oriented to person, place, and time;     recent and remote memory intact;     language fluent;     normal attention, concentration,     fund of knowledge Cranial Nerves:    The pupils are equal, round, and reactive to light. The fundi are normal and spontaneous venous pulsations are present. Visual fields are full to finger confrontation. Extraocular movements are intact. Trigeminal sensation is intact and the muscles of mastication are normal. The face is symmetric. The palate elevates in the midline. Hearing intact. Voice is normal. Shoulder shrug is normal. The tongue has normal motion without fasciculations.   Coordination:    Normal finger to nose and heel to shin. Normal rapid alternating movements.   Gait:    Heel-toe and tandem gait are normal.   Motor Observation:    No asymmetry, no atrophy, and no involuntary movements noted. Tone:    Normal muscle tone.    Posture:    Posture is normal. normal erect    Strength: Left triceps weakness otherwise strength is V/V in the upper and lower limbs.      Sensation: intact to LT and vibration     Reflex Exam:  DTR's: Decreased left triceps DTR otherwise deep tendon reflexes in the upper and lower extremities are brisk bilaterally.   Toes:    The toes are downgoing bilaterally.   Clonus:    Clonus is absent.        Assessment/Plan:  50 year old female with left arm weakness and hand paresthesias in a median distribution. MRI of the cervical spine shows multilevel spondylosis as described. Potentially symptomatic central stenosis and neural impingement most likely at  C5-C6 as well as enlarged nerve roots suspicious for CIDP. She has mild left arm weakness and dec triceps DTR but otherwise Her exam is not c/w CIDP as she has good reflexes throughout and good strength.   Emg/ncs: 4-limb emg for CIDP as well as left arm pain/CTS vs radic MRI of the cervical spine and lumbar spine with contrast Lumbar puncture after imaging complete Referal to [place] after emg/ncs. Labs for other causes of enlarged spinal nerve roots  Addendum 06/09/2016: Repeat MRI of the cervical spine and lumbar spine not consistent with CIDP or inflammatory process, the cervical enlarged nerve roots do not appear to be infectious, inflammatory or neoplastic. EMG/NCS c/w acute on chronic cervical radiculopathy, ESI x 1 was not successful will refer to Orthopaedics Dr. Melina Schools for evaluation of surgical procedures as her mri results and symptoms are c/w radiculopathy.  Repeat MRI c-spine w/wo contrast 05/28/2016:  This MRI of the cervical spine shows the following: 1.    Some of the cervical nerve roots appear to be enlarged in the paraspinous soft tissue as noted previously. They do not enhance abnormally. Etiology is unclear but does not appear to be inflammatory or neoplastic. 2.    Multilevel degenerative changes as detailed above, unchanged when compared to the 04/23/2016 MRI. There is potential for bilateral C6 and left C7 nerve root compression. 3.    The spinal cord appears normal and there is a normal enhancement pattern.  MRI lumbar spine 05/2016:   IMPRESSION:  This MRI of the lumbar spine with and without contrast shows the following: 1.    At L4-L5 there are degenerative changes causing mild foraminal and lateral recess  stenosis and mild central canal narrowing but no nerve root compression.  2.   At L5-S1 degenerative changes with moderately severe left lateral recess stenosis with some encroachment upon the left S1 nerve root. 3.   Nerve roots appear normal. 4.   There is a normal enhancement pattern.  EMG/NCS: Showed CRDs in the lower left cervical paraspinal muscles c/w chronic neuropathic changes.   Cc; Lilia Argue and Zenon Mayo, [facility] [address] [place], [place] [address]  Phone [phone] Fax [phone]

## 2016-05-11 NOTE — Patient Instructions (Addendum)
Remember to drink plenty of fluid, eat healthy meals and do not skip any meals. Try to eat protein with a every meal and eat a healthy snack such as fruit or nuts in between meals. Try to keep a regular sleep-wake schedule and try to exercise daily, particularly in the form of walking, 20-30 minutes a day, if you can.   As far as diagnostic testing: MRI of the cervical spine and back. Possibly a lumbar puncture. EMG/NCS. Labwork.  I would like to see you back in for emg/ncs, sooner if we need to. Please call us with any interim questions, concerns, problems, updates or refill requests.   Our phone number is [phone]. We also have an after hours call service for urgent matters and there is a physician on-call for urgent questions. For any emergencies you know to call 911 or go to the nearest emergency room

## 2016-05-13 ENCOUNTER — Encounter: Payer: Self-pay | Admitting: *Deleted

## 2016-05-13 MED ORDER — ALPRAZOLAM 0.5 MG PO TABS: ORAL_TABLET | ORAL | 0 refills | Status: DC

## 2016-05-13 MED FILL — ALPRAZolam 0.5 MG TABS: 0.5 | 2 days supply | Qty: 5 | Fill #0

## 2016-05-13 NOTE — Progress Notes (Signed)
Faxed printed/signed rx xanax by AA, MD to pt pharmacy. [phone]. Received confirmation.

## 2016-05-13 NOTE — Addendum Note (Signed)
Addended by: Sarina Ill B on: 05/13/2016 07:44 AM   Modules accepted: Orders

## 2016-05-14 ENCOUNTER — Telehealth: Payer: Self-pay | Admitting: *Deleted

## 2016-05-14 LAB — MULTIPLE MYELOMA PANEL, SERUM
ALBUMIN SERPL ELPH-MCNC: 3.3 g/dL (ref 2.9–4.4)
Albumin/Glob SerPl: 0.9 (ref 0.7–1.7)
Alpha 1: 0.3 g/dL (ref 0.0–0.4)
Alpha2 Glob SerPl Elph-Mcnc: 1 g/dL (ref 0.4–1.0)
B-Globulin SerPl Elph-Mcnc: 1.2 g/dL (ref 0.7–1.3)
Gamma Glob SerPl Elph-Mcnc: 1.3 g/dL (ref 0.4–1.8)
Globulin, Total: 3.8 g/dL (ref 2.2–3.9)
IGA/IMMUNOGLOBULIN A, SERUM: 159 mg/dL (ref 87–352)
IGM (IMMUNOGLOBULIN M), SRM: 160 mg/dL (ref 26–217)
IgG (Immunoglobin G), Serum: 1256 mg/dL (ref 700–1600)
TOTAL PROTEIN: 7.1 g/dL (ref 6.0–8.5)

## 2016-05-14 LAB — ANA COMPREHENSIVE PANEL
Anti JO-1: <0.2 AI (ref 0.0–0.9)
Centromere Ab Screen: <0.2 AI (ref 0.0–0.9)
Chromatin Ab SerPl-aCnc: <0.2 AI (ref 0.0–0.9)
DSDNA AB: 4 [IU]/mL (ref 0–9)
ENA RNP Ab: <0.2 AI (ref 0.0–0.9)
ENA SM Ab Ser-aCnc: <0.2 AI (ref 0.0–0.9)

## 2016-05-14 LAB — BASIC METABOLIC PANEL
BUN/Creatinine Ratio: 10 (ref 9–23)
BUN: 8 mg/dL (ref 6–24)
CALCIUM: 9.7 mg/dL (ref 8.7–10.2)
CHLORIDE: 98 mmol/L (ref 96–106)
CO2: 27 mmol/L (ref 18–29)
Creatinine, Ser: 0.81 mg/dL (ref 0.57–1.00)
GFR, EST AFRICAN AMERICAN: 97 mL/min/{1.73_m2} (ref >59)
GFR, EST NON AFRICAN AMERICAN: 84 mL/min/{1.73_m2} (ref >59)
Glucose: 143 mg/dL — ABNORMAL HIGH (ref 65–99)
Potassium: 3.8 mmol/L (ref 3.5–5.2)
Sodium: 140 mmol/L (ref 134–144)

## 2016-05-14 LAB — ANA: ANA Titer 1: NEGATIVE

## 2016-05-14 LAB — ANGIOTENSIN CONVERTING ENZYME: ANGIO CONVERT ENZYME: 27 U/L (ref 14–82)

## 2016-05-14 LAB — C-REACTIVE PROTEIN: CRP: 36.7 mg/L — ABNORMAL HIGH (ref 0.0–4.9)

## 2016-05-14 LAB — B. BURGDORFI ANTIBODIES: Lyme IgG/IgM Ab: <0.91 {ISR} (ref 0.00–0.90)

## 2016-05-14 LAB — SEDIMENTATION RATE: SED RATE: 23 mm/h (ref 0–40)

## 2016-05-14 NOTE — Telephone Encounter (Signed)
LMVM Cell (ok per DPR) that her labs ok, crp general marker for inflammation and glucose elevated otherwise normal labs.  Will discuss in detail when in for [place]/emg.

## 2016-05-14 NOTE — Telephone Encounter (Signed)
-----   Message from Melvenia Beam, MD sent at 05/14/2016 11:04 AM EDT ----- Labs unremarkable, crp which is a general marker of inflammation was slightly elevated and her glucose was elevated otherwise normal labs. We can discuss in detail at emg/ncs

## 2016-05-20 ENCOUNTER — Other Ambulatory Visit: Payer: Self-pay | Admitting: Internal Medicine

## 2016-05-20 DIAGNOSIS — E049 Nontoxic goiter, unspecified: Secondary | ICD-10-CM

## 2016-05-25 ENCOUNTER — Encounter: Payer: Self-pay | Admitting: Neurology

## 2016-05-26 ENCOUNTER — Other Ambulatory Visit: Payer: Self-pay | Admitting: Neurology

## 2016-05-26 DIAGNOSIS — M5010 Cervical disc disorder with radiculopathy, unspecified cervical region: Secondary | ICD-10-CM

## 2016-05-27 ENCOUNTER — Ambulatory Visit
Admission: RE | Admit: 2016-05-27 | Discharge: 2016-05-27 | Disposition: A | Discharge status: Home / Self Care | Payer: 59 | Source: Ambulatory Visit | Attending: Neurology | Admitting: Neurology

## 2016-05-27 DIAGNOSIS — G629 Polyneuropathy, unspecified: Secondary | ICD-10-CM

## 2016-05-27 DIAGNOSIS — M5010 Cervical disc disorder with radiculopathy, unspecified cervical region: Secondary | ICD-10-CM

## 2016-05-27 DIAGNOSIS — R202 Paresthesia of skin: Secondary | ICD-10-CM

## 2016-05-27 DIAGNOSIS — G544 Lumbosacral root disorders, not elsewhere classified: Secondary | ICD-10-CM | POA: Diagnosis not present

## 2016-05-27 DIAGNOSIS — R29898 Other symptoms and signs involving the musculoskeletal system: Secondary | ICD-10-CM

## 2016-05-27 DIAGNOSIS — G542 Cervical root disorders, not elsewhere classified: Secondary | ICD-10-CM | POA: Diagnosis not present

## 2016-05-27 DIAGNOSIS — M5440 Lumbago with sciatica, unspecified side: Secondary | ICD-10-CM

## 2016-05-27 DIAGNOSIS — G6181 Chronic inflammatory demyelinating polyneuritis: Secondary | ICD-10-CM

## 2016-05-27 DIAGNOSIS — M4802 Spinal stenosis, cervical region: Secondary | ICD-10-CM | POA: Diagnosis not present

## 2016-05-27 DIAGNOSIS — M48061 Spinal stenosis, lumbar region without neurogenic claudication: Secondary | ICD-10-CM | POA: Diagnosis not present

## 2016-05-27 MED ORDER — GADOBENATE DIMEGLUMINE 529 MG/ML IV SOLN
20.0000 mL | Freq: Once | INTRAVENOUS | Status: AC | PRN
  Administered 2016-05-27: 20 mL via INTRAVENOUS

## 2016-06-01 ENCOUNTER — Ambulatory Visit (HOSPITAL_COMMUNITY): Payer: 59

## 2016-06-01 NOTE — Telephone Encounter (Signed)
Patient had MRI at GI on 05/27/16

## 2016-06-02 ENCOUNTER — Telehealth: Payer: Self-pay | Admitting: Neurology

## 2016-06-02 NOTE — Telephone Encounter (Signed)
Spoke to patient regarding imaging results below. We should enlarged cervical nerve roots with serial MRIs next one in 6 months to one year unless new or worsening symptoms appear. Nerve roots of the cervical spine mildly enlarged but do not enhance. MRI of the lumbar spine did not show similar nerve root enlargement, not consistent with CIDP, infectious, inflammatory or neoplastic causes. She does have nerve root pinching in c6 and c7 bilateral which could be causing the issues in the hands. She also has possible left s1 compression on MRI lumbar spine but she does not have any symptoms to correspond. She is coming in Tuesday for emg/ncs and we will discuss everything again then. Will cc Dr. Micheline Chapman on this.   MRi cervical spine w/wo 1.    Some of the cervical nerve roots appear to be enlarged in the paraspinous soft tissue as noted previously. They do not enhance abnormally. Etiology is unclear but does not appear to be inflammatory or neoplastic. 2.    Multilevel degenerative changes as detailed above, unchanged when compared to the 04/23/2016 MRI. There is potential for bilateral C6 and left C7 nerve root compression. 3.    The spinal cord appears normal and there is a normal enhancement pattern.   MRI l-spine w/wo:   This MRI of the lumbar spine with and without contrast shows the following: 1.    At L4-L5 there are degenerative changes causing mild foraminal and lateral recess stenosis and mild central canal narrowing but no nerve root compression.  2.   At L5-S1 degenerative changes with moderately severe left lateral recess stenosis with some encroachment upon the left S1 nerve root. 3.   Nerve roots appear normal. 4.   There is a normal enhancement pattern.

## 2016-06-07 ENCOUNTER — Ambulatory Visit (HOSPITAL_COMMUNITY)
Admission: RE | Admit: 2016-06-07 | Discharge: 2016-06-07 | Disposition: A | Discharge status: Home / Self Care | Payer: 59 | Source: Ambulatory Visit | Attending: Internal Medicine | Admitting: Internal Medicine

## 2016-06-07 DIAGNOSIS — E041 Nontoxic single thyroid nodule: Secondary | ICD-10-CM | POA: Diagnosis not present

## 2016-06-07 DIAGNOSIS — E049 Nontoxic goiter, unspecified: Secondary | ICD-10-CM | POA: Insufficient documentation

## 2016-06-08 ENCOUNTER — Ambulatory Visit (INDEPENDENT_AMBULATORY_CARE_PROVIDER_SITE_OTHER): Payer: 59 | Admitting: Neurology

## 2016-06-08 ENCOUNTER — Ambulatory Visit (INDEPENDENT_AMBULATORY_CARE_PROVIDER_SITE_OTHER): Payer: Self-pay | Admitting: Neurology

## 2016-06-08 DIAGNOSIS — R29898 Other symptoms and signs involving the musculoskeletal system: Secondary | ICD-10-CM | POA: Diagnosis not present

## 2016-06-08 DIAGNOSIS — R200 Anesthesia of skin: Secondary | ICD-10-CM

## 2016-06-08 DIAGNOSIS — Z0289 Encounter for other administrative examinations: Secondary | ICD-10-CM

## 2016-06-08 DIAGNOSIS — M5010 Cervical disc disorder with radiculopathy, unspecified cervical region: Secondary | ICD-10-CM

## 2016-06-08 DIAGNOSIS — G6181 Chronic inflammatory demyelinating polyneuritis: Secondary | ICD-10-CM

## 2016-06-08 DIAGNOSIS — R202 Paresthesia of skin: Secondary | ICD-10-CM

## 2016-06-08 NOTE — Progress Notes (Signed)
  [id number]    Provider:  Dr Jaynee Eagles Referring Provider: Bartholomew Crews, MD Primary Care Physician:  Larey Dresser, MD  History: 50 year old female with left arm weakness and hand paresthesias in a median distribution. MRI of the cervical spine shows multilevel spondylosis as described. Potentially symptomatic central stenosis and neural impingement most likely at C5-C6 as well as enlarged nerve roots which is idiopathic (not consistent with CIDP or other inflammatory/infectious or neoplastic causes). She has mild left arm weakness and dec triceps DTR. Her exam is not c/w CIDP as she has good reflexes throughout and good strength. This is likely cervical radiculopathy.   MRI cervical spine: Multilevel spondylosis as described. Potentially symptomatic stenosis and neural impingement most likely at C5-C6.  Summary  Nerve conduction studies were performed on the bilateral upper and lower extremities:  The bilateral Median motor nerves showed normal conductions with normal F Wave latency The bilateral Ulnar motor nerves showed normal conductions with normal F Wave latency The bilateral Peroneal motor nerves showed normal conductions with normal F Wave latency The bilateral Tibial motor nerves showed normal conductions with normal F Wave latency The left second-digit Median sensory nerve was within normal limits The left fifth-digit Ulnar sensory nerve was within normal limits The bilateral Sural sensory nerves were within normal limits The bilateral Superficial Peroneal sensory nerves were within normal limits Bilateral H Reflexes showed normal latencies  EMG Needle study was performed on selected left upper extremity muscles:   The Deltoid, Triceps, left Biceps, Pronator Teres, Flexor Digitorum Profundus, Opponens Pollicis, First Dorsal interosseous  muscles were within normal limits. Lower left cervical paraspinal muscles showed complex repetitive discharges.    Conclusion: There are complex repetitive discharges in the lower left cervical paraspinals consistent with chronic denervation. Conservative measures, PT and epidural steroid injections have been unsuccessful. Will refer for surgical evaluation of cervical radiculopathy.    Sarina Ill, MD  [facility] [address] [place], [place] [address]  Phone [phone] Fax [phone]

## 2016-06-09 NOTE — Addendum Note (Signed)
Addended by: Sarina Ill B on: 06/09/2016 08:15 AM   Modules accepted: Orders

## 2016-06-09 NOTE — Procedures (Signed)
  [id number]    Provider:  Dr Jaynee Eagles Referring Provider: Bartholomew Crews, MD Primary Care Physician:  Larey Dresser, MD  History: 50 year old female with left arm weakness and hand paresthesias in a median distribution. MRI of the cervical spine shows multilevel spondylosis as described. Potentially symptomatic central stenosis and neural impingement most likely at C5-C6 as well as enlarged nerve roots which is idiopathic (not consistent with CIDP or other inflammatory/infectious or neoplastic causes). She has mild left arm weakness and dec triceps DTR. Her exam is not c/w CIDP as she has good reflexes throughout and good strength. This is likely cervical radiculopathy.   MRI cervical spine: Multilevel spondylosis as described. Potentially symptomatic stenosis and neural impingement most likely at C5-C6.  Summary  Nerve conduction studies were performed on the bilateral upper and lower extremities:  The bilateral Median motor nerves showed normal conductions with normal F Wave latency The bilateral Ulnar motor nerves showed normal conductions with normal F Wave latency The bilateral Peroneal motor nerves showed normal conductions with normal F Wave latency The bilateral Tibial motor nerves showed normal conductions with normal F Wave latency The left second-digit Median sensory nerve was within normal limits The left fifth-digit Ulnar sensory nerve was within normal limits The bilateral Sural sensory nerves were within normal limits The bilateral Superficial Peroneal sensory nerves were within normal limits Bilateral H Reflexes showed normal latencies  EMG Needle study was performed on selected left upper extremity muscles:   The Deltoid, Triceps, left Biceps, Pronator Teres, Flexor Digitorum Profundus, Opponens Pollicis, First Dorsal interosseous  muscles were within normal limits. Lower left cervical paraspinal muscles showed complex repetitive discharges.    Conclusion: There are complex repetitive discharges in the lower left cervical paraspinals consistent with chronic denervation. Conservative measures, PT and epidural steroid injections have been unsuccessful. Will refer for surgical evaluation of cervical radiculopathy.    Sarina Ill, MD  [facility] [address] [place], [place] [address]  Phone [phone] Fax [phone]

## 2016-06-09 NOTE — Progress Notes (Signed)
See procedure note.

## 2016-06-15 ENCOUNTER — Encounter: Payer: Self-pay | Admitting: Internal Medicine

## 2016-06-15 ENCOUNTER — Encounter: Payer: Self-pay | Admitting: Neurology

## 2016-06-15 ENCOUNTER — Other Ambulatory Visit: Payer: Self-pay | Admitting: *Deleted

## 2016-06-15 MED ORDER — ALPRAZOLAM 0.25 MG PO TABS
0.2500 mg | ORAL_TABLET | Freq: Three times a day (TID) | ORAL | 0 refills | Status: DC | PRN
Start: 2016-06-15 — End: 2019-12-20

## 2016-06-15 NOTE — Telephone Encounter (Signed)
Reviewed chart.  Appears that Denise Stevens takes medication PRN occasionally.  Last refill was 11/2014 for #270.  Review of [place] narcotic database for last year shows only small Rx prior to MRI.  Refill likely appropriate.  Will need to see her PCP In future for further refills if needed and assessment of chronic anxiety.

## 2016-06-16 NOTE — Telephone Encounter (Signed)
Called to pharm 11/7

## 2016-06-17 ENCOUNTER — Encounter: Payer: Self-pay | Admitting: Neurology

## 2016-06-17 DIAGNOSIS — M5010 Cervical disc disorder with radiculopathy, unspecified cervical region: Secondary | ICD-10-CM

## 2016-06-25 MED FILL — BUPROPION HCL XL 150 MG TAB: 150 | 90 days supply | Qty: 90 | Fill #1

## 2016-06-25 MED FILL — ALPRAZolam 0.25 MG TABS: 0.25 | 30 days supply | Qty: 90 | Fill #0

## 2016-06-26 ENCOUNTER — Encounter: Payer: Self-pay | Admitting: Internal Medicine

## 2016-06-26 DIAGNOSIS — E041 Nontoxic single thyroid nodule: Secondary | ICD-10-CM | POA: Insufficient documentation

## 2016-07-05 DIAGNOSIS — G4733 Obstructive sleep apnea (adult) (pediatric): Secondary | ICD-10-CM | POA: Diagnosis not present

## 2016-07-09 DIAGNOSIS — R202 Paresthesia of skin: Secondary | ICD-10-CM | POA: Insufficient documentation

## 2016-07-26 ENCOUNTER — Ambulatory Visit (INDEPENDENT_AMBULATORY_CARE_PROVIDER_SITE_OTHER): Payer: 59 | Admitting: Sports Medicine

## 2016-07-26 ENCOUNTER — Encounter: Payer: Self-pay | Admitting: Sports Medicine

## 2016-07-26 VITALS — BP 159/92 | Ht 65.5 in | Wt 325.0 lb

## 2016-07-26 DIAGNOSIS — M7062 Trochanteric bursitis, left hip: Secondary | ICD-10-CM

## 2016-07-26 DIAGNOSIS — M7061 Trochanteric bursitis, right hip: Secondary | ICD-10-CM

## 2016-07-26 MED ORDER — METHYLPREDNISOLONE ACETATE 40 MG/ML IJ SUSP
40.0000 mg | Freq: Once | INTRAMUSCULAR | Status: AC
  Administered 2016-07-26: 40 mg via INTRA_ARTICULAR

## 2016-07-27 NOTE — Progress Notes (Signed)
   Subjective:    Patient ID: Denise Stevens, female    DOB: 1965/10/10, 50 y.o.   MRN: [id number]  HPI chief complaint: Bilateral hip pain  Denise Stevens comes in today complaining of bilateral hip pain, right greater than left. She denies any recent trauma. She has had greater trochanteric bursitis in the past and states that her current symptoms are similar to what she experienced at that time. Cortisone injection 7 years ago helped to alleviate her pain. She localizes all of her pain to the lateral hip. No groin pain. Pain will radiate down the lateral aspect of her thigh to her knee but nothing past the knee. No associated numbness or tingling.  Past medical history reviewed Medications reviewed Allergies reviewed    Review of Systems    as above Objective:   Physical Exam  Morbidly obese. No acute distress  Examination of each hip shows smooth painless hip range of motion with a negative logroll. She is diffusely tender to palpation along the lateral aspect of each hip with the maximum area of tenderness being over the area of the greater trochanter. Negative straight leg raise bilaterally. No focal neurological deficit of either lower extremity.      Assessment & Plan:   Bilateral hip pain, right greater than left, secondary to greater trochanteric bursitis Morbid obesity Diabetes  Patient's more symptomatic right hip was injected today with cortisone. I did not want to inject both hips today due to possible hyperglycemia from 2 injections. In fact, I explained to the patient that she may have some mild transient hyperglycemia even after her single injection today. She will follow-up with me in 2 weeks for reevaluation. Once her pain has improved, I will show her some hip strengthening exercises. If her right hip responds favorably to today's injection, then we could consider injecting her left hip at follow-up if still symptomatic. Of note, patient is seriously considering gastric  sleeve surgery for her morbid obesity. She is going to go to an informational session tomorrow night. From an orthopedic standpoint, there is no doubt that her morbid obesity is playing a role not only in her current hip pain but also in her previous orthopedic complaints as well.  Consent obtained and verified. Time-out conducted. Noted no overlying erythema, induration, or other signs of local infection. Skin prepped in a sterile fashion. Topical analgesic spray: Ethyl chloride. Joint: right hip greater troch Needle: 3 inch spinal needle Completed without difficulty. Meds: 2cc (40mg ) depomedrol, 6cc 1% xylocaine  Advised to call if fevers/chills, erythema, induration, drainage, or persistent bleeding.

## 2016-08-10 ENCOUNTER — Encounter: Payer: Self-pay | Admitting: Sports Medicine

## 2016-08-10 ENCOUNTER — Other Ambulatory Visit: Payer: Self-pay | Admitting: Internal Medicine

## 2016-08-10 ENCOUNTER — Ambulatory Visit (INDEPENDENT_AMBULATORY_CARE_PROVIDER_SITE_OTHER): Payer: 59 | Admitting: Sports Medicine

## 2016-08-10 VITALS — BP 148/88 | Ht 65.5 in | Wt 325.0 lb

## 2016-08-10 DIAGNOSIS — M7062 Trochanteric bursitis, left hip: Secondary | ICD-10-CM | POA: Diagnosis not present

## 2016-08-10 MED ORDER — ESCITALOPRAM OXALATE 10 MG PO TABS: 10.0000 mg | ORAL_TABLET | Freq: Every morning | ORAL | 3 refills | Status: DC

## 2016-08-10 MED ORDER — METHYLPREDNISOLONE ACETATE 40 MG/ML IJ SUSP
40.0000 mg | Freq: Once | INTRAMUSCULAR | Status: AC
  Administered 2016-08-10: 40 mg via INTRA_ARTICULAR

## 2016-08-10 NOTE — Progress Notes (Signed)
   Subjective:    Patient ID: Denise Stevens, female    DOB: 08-18-1965, 51 y.o.   MRN: [id number]  HPI chief complaint: Left hip pain  Patient comes in today for a cortisone injection for left hip greater trochanteric bursitis. She was last seen in the office in December 2017. Cortisone injection administered into the right greater trochanteric bursa at that visit has been helpful but not curative for her right hip pain. Her left hip pain is similar in nature to what she experiences on the right but she does occasionally get groin pain on the left as well. Since our last office visit, she has attended an informational session on gastric bypass surgery and is currently scheduled for surgical consultation on January 26.  Interim medical history reviewed Medications reviewed Allergies reviewed    Review of Systems    as above Objective:   Physical Exam  Obese. No acute distress. Awake alert and oriented 3. Vital signs reviewed  Left hip: Smooth painless hip range of motion with a negative logroll. She is diffusely tender to palpation over the left greater trochanter. Weakness with hip abduction due to pain. Neurovascularly intact distally.       Assessment & Plan:   Bilateral hip pain, left greater than right, secondary to greater trochanteric bursitis Morbid obesity  Patient's right hip received only partial pain relief from our cortisone injection a couple weeks ago. However, the patient would like to go ahead and proceed with a left hip greater trochanteric bursal injection today. This was accomplished atraumatically under sterile technique after risks and benefits were explained including the risk of transient hyperglycemia. Patient tolerated this without difficulty. If her left hip pain resolves as a result of today's injection, then I would feel comfortable trying a repeat injection in her right hip area ultimately, I agree that her morbid obesity is playing a large role not only  in her current hip pain that in her other orthopedic issues as well. I agree with the surgical consultation to discuss gastric bypass surgery. If the patient's hip pain, particularly the left anterior hip pain, persists then I would consider x-rays to evaluate for osteoarthritis. Follow-up for ongoing or recalcitrant issues.  Consent obtained and verified. Time-out conducted. Noted no overlying erythema, induration, or other signs of local infection. Skin prepped in a sterile fashion. Topical analgesic spray: Ethyl chloride. Joint: left hip greater troch bursa Needle: 22g 3 inch spinal Completed without difficulty. Meds: 2cc (80mg ) depomedrol, 6cc 1% xylocaine  Advised to call if fevers/chills, erythema, induration, drainage, or persistent bleeding.

## 2016-08-17 ENCOUNTER — Encounter: Payer: Self-pay | Admitting: Internal Medicine

## 2016-08-27 ENCOUNTER — Other Ambulatory Visit: Payer: Self-pay | Admitting: Internal Medicine

## 2016-08-27 DIAGNOSIS — R1010 Upper abdominal pain, unspecified: Secondary | ICD-10-CM

## 2016-08-30 ENCOUNTER — Other Ambulatory Visit (INDEPENDENT_AMBULATORY_CARE_PROVIDER_SITE_OTHER): Payer: 59

## 2016-08-30 DIAGNOSIS — R1010 Upper abdominal pain, unspecified: Secondary | ICD-10-CM | POA: Diagnosis not present

## 2016-08-30 MED FILL — VALSARTAN-HCTZ 160-25 MG TA: 160-25 | 90 days supply | Qty: 90 | Fill #1

## 2016-08-30 MED FILL — PRAVASTATIN NA 40 MG TAB: 40 | 90 days supply | Qty: 90 | Fill #2

## 2016-08-30 MED FILL — ESCITALOPRAM 10 MG TABLET: 10 | 90 days supply | Qty: 90 | Fill #0

## 2016-08-30 MED FILL — PANTOPRAZOLE SOD DR 40 MG T: 40 | 90 days supply | Qty: 90 | Fill #1

## 2016-08-31 LAB — CMP14 + ANION GAP
ALK PHOS: 89 IU/L (ref 39–117)
ALT: 29 IU/L (ref 0–32)
AST: 20 IU/L (ref 0–40)
Albumin/Globulin Ratio: 1.2 (ref 1.2–2.2)
Albumin: 4 g/dL (ref 3.5–5.5)
Anion Gap: 18 mmol/L (ref 10.0–18.0)
BUN/Creatinine Ratio: 10 (ref 9–23)
BUN: 8 mg/dL (ref 6–24)
Bilirubin Total: <0.2 mg/dL (ref 0.0–1.2)
CO2: 24 mmol/L (ref 18–29)
Calcium: 9.1 mg/dL (ref 8.7–10.2)
Chloride: 95 mmol/L — ABNORMAL LOW (ref 96–106)
Creatinine, Ser: 0.83 mg/dL (ref 0.57–1.00)
GFR calc non Af Amer: 81 mL/min/{1.73_m2} (ref >59)
GFR, EST AFRICAN AMERICAN: 94 mL/min/{1.73_m2} (ref >59)
GLOBULIN, TOTAL: 3.4 g/dL (ref 1.5–4.5)
Glucose: 146 mg/dL — ABNORMAL HIGH (ref 65–99)
POTASSIUM: 3.6 mmol/L (ref 3.5–5.2)
SODIUM: 137 mmol/L (ref 134–144)
TOTAL PROTEIN: 7.4 g/dL (ref 6.0–8.5)

## 2016-08-31 LAB — CBC
HEMATOCRIT: 36.7 % (ref 34.0–46.6)
Hemoglobin: 12 g/dL (ref 11.1–15.9)
MCH: 27.7 pg (ref 26.6–33.0)
MCHC: 32.7 g/dL (ref 31.5–35.7)
MCV: 85 fL (ref 79–97)
Platelets: 325 10*3/uL (ref 150–379)
RBC: 4.33 x10E6/uL (ref 3.77–5.28)
RDW: 14.4 % (ref 12.3–15.4)
WBC: 12.4 10*3/uL — AB (ref 3.4–10.8)

## 2016-08-31 LAB — LIPASE: Lipase: 39 U/L (ref 14–72)

## 2016-09-01 ENCOUNTER — Other Ambulatory Visit: Payer: Self-pay | Admitting: Internal Medicine

## 2016-09-01 MED ORDER — METRONIDAZOLE 500 MG PO TABS: 500.0000 mg | ORAL_TABLET | Freq: Two times a day (BID) | ORAL | 0 refills | Status: DC

## 2016-09-01 MED FILL — metroNIDAZOLE 500 MG TABS: 500 | 7 days supply | Qty: 14 | Fill #0

## 2016-09-03 DIAGNOSIS — Z6841 Body Mass Index (BMI) 40.0 and over, adult: Secondary | ICD-10-CM | POA: Diagnosis not present

## 2016-09-07 ENCOUNTER — Ambulatory Visit (HOSPITAL_COMMUNITY): Payer: 59

## 2016-09-08 ENCOUNTER — Other Ambulatory Visit (HOSPITAL_COMMUNITY): Payer: Self-pay | Admitting: General Surgery

## 2016-09-13 ENCOUNTER — Ambulatory Visit: Payer: Self-pay | Admitting: Pharmacist

## 2016-09-20 NOTE — Progress Notes (Signed)
FYI- Patient cancelled her Ultrasound Appointment.

## 2016-09-22 ENCOUNTER — Encounter: Payer: 59 | Attending: General Surgery | Admitting: Skilled Nursing Facility1

## 2016-09-22 ENCOUNTER — Encounter: Payer: Self-pay | Admitting: Skilled Nursing Facility1

## 2016-09-22 DIAGNOSIS — E119 Type 2 diabetes mellitus without complications: Secondary | ICD-10-CM

## 2016-09-22 DIAGNOSIS — Z713 Dietary counseling and surveillance: Secondary | ICD-10-CM | POA: Diagnosis not present

## 2016-09-22 NOTE — Patient Instructions (Addendum)
Follow Pre-Op Goals Try Protein Shakes Call [facility] at [phone] when surgery is scheduled to enroll in Pre-Op Class  Things to remember:  Please always be honest with [place]. We want to support you!  If you have any questions or concerns in between appointments, please call or email   The diet after surgery will be high protein and low in carbohydrate.  Vitamins and calcium need to be taken for the rest of your life.  Feel free to include support people in any classes or appointments.  Try to get on the bike 10 minutes 1 day a week   Supplement recommendations:  Before Surgery   1 Complete Multivitamin with Iron  3000 IU Vitamin D3  After Surgery   2 Chewable Multivitamins  **Best Choice - Bariatric Advantage Advanced Multi EA      3 Chewable Calcium (500 mg each, total 1200-1500 mg per day)  **Best Choice - Celebrate, Bariatric Advantage, or Wellesse  Other Options:    2 Flinstones Complete + up to 100 mg Thiamin + 2000-3000 IU Vitamin D3 + 350-500 mcg Vitamin B12 + 30-45 mg Iron (with history of deficiency)  2 Celebrate MultiComplete with 18 mg Iron (this provides 6000 IU of  Vitamin D3)  4 Celebrate Essential Multi 2 in 1 (has calcium) + 18-60 mg separate  iron  Vitamins and Calcium are available at:   [facility]   [address], [place], [place] [address]   [email or web address].com  www.celebratevitamins.com  www.amazon.com

## 2016-09-22 NOTE — Progress Notes (Signed)
Pre-Op Assessment Visit:  Pre-Operative RYGB Surgery  Medical Nutrition Therapy:  Appt start time: 11:20  End time:  12:20  Patient was seen on 09/22/2016 for Pre-Operative Nutrition Assessment. Assessment and letter of approval faxed to [facility] Bariatric Surgery Program coordinator on 09/22/2016.  Advised to check if she needs SWL. Pt states she has trouble with ruffage: diarrhea and gas sometimes constipated with the victoza. Pt states her victoza suppresses her appetite. Pt states she gave up diet Mountain dew for lent. Pt states she checks her blood sugar sometimes: when she does she gets 103, 134, 125-fasting numbers, last A1C 8.4.   Start weight at [facility]: 316 BMI: 51.20  24 hr Dietary Recall: First Meal: scrambled eggs, toast, grits Snack:  Second Meal: chicken and mac n cheese  Snack:  Third Meal: pinto beans and greens and roast beef Snack:  Beverages: half and half tea, water  Encouraged to engage in 50 minutes of moderate physical activity including cardiovascular and weight baring weekly  Handouts given during visit include:  . Pre-Op Goals . Bariatric Surgery Protein Shakes During the appointment today the following Pre-Op Goals were reviewed with the patient: . Maintain or lose weight as instructed by your surgeon . Make healthy food choices . Begin to limit portion sizes . Limited concentrated sugars and fried foods . Keep fat/sugar in the single digits per serving on       food labels . Practice CHEWING your food  (aim for 30 chews per bite or until applesauce consistency) . Practice not drinking 15 minutes before, during, and 30 minutes after each meal/snack . Avoid all carbonated beverages  . Avoid/limit caffeinated beverages  . Avoid all sugar-sweetened beverages . Consume 3 meals per day; eat every 3-5 hours . Make a list of non-food related activities . Aim for 64-100 ounces of FLUID daily  . Aim for at least 60-80 grams of PROTEIN  daily . Look for a liquid protein source that contain ?15 g protein and ?5 g carbohydrate  (ex: shakes, drinks, shots)  -Follow diet recommendations listed below   Energy and Macronutrient Recomendations: Calories: 1600 Carbohydrate: 180 Protein: 120 Fat: 44 Demonstrated degree of understanding via:  Teach Back  Teaching Method Utilized:  Visual Auditory Hands on  Barriers to learning/adherence to lifestyle change: none stated  Patient to call the [facility] and [facility] Education Services to enroll in Pre-Op and Post-Op Nutrition Education when surgery date is scheduled.

## 2016-10-11 ENCOUNTER — Ambulatory Visit (HOSPITAL_COMMUNITY): Payer: 59

## 2016-10-13 ENCOUNTER — Other Ambulatory Visit: Payer: Self-pay | Admitting: Internal Medicine

## 2016-10-13 DIAGNOSIS — G4733 Obstructive sleep apnea (adult) (pediatric): Secondary | ICD-10-CM | POA: Diagnosis not present

## 2016-10-13 MED ORDER — HYDROCORTISONE-ACETIC ACID 1-2 % OT SOLN: 4.0000 [drp] | Freq: Three times a day (TID) | OTIC | 0 refills | Status: DC

## 2016-10-13 MED FILL — HYDROCORTISON-ACETIC ACID S: 1-2 | 8 days supply | Qty: 10 | Fill #0

## 2016-10-21 ENCOUNTER — Ambulatory Visit (INDEPENDENT_AMBULATORY_CARE_PROVIDER_SITE_OTHER): Payer: 59 | Admitting: Psychiatry

## 2016-10-21 DIAGNOSIS — F509 Eating disorder, unspecified: Secondary | ICD-10-CM | POA: Diagnosis not present

## 2016-10-22 ENCOUNTER — Other Ambulatory Visit: Payer: Self-pay | Admitting: Internal Medicine

## 2016-10-22 MED ORDER — SULFAMETHOXAZOLE-TRIMETHOPRIM 800-160 MG PO TABS: 1.0000 | ORAL_TABLET | Freq: Two times a day (BID) | ORAL | 0 refills | Status: DC

## 2016-10-25 ENCOUNTER — Other Ambulatory Visit: Payer: Self-pay | Admitting: Internal Medicine

## 2016-10-25 ENCOUNTER — Ambulatory Visit: Payer: 59 | Admitting: Sports Medicine

## 2016-10-25 DIAGNOSIS — E119 Type 2 diabetes mellitus without complications: Secondary | ICD-10-CM

## 2016-10-25 MED ORDER — CITALOPRAM HYDROBROMIDE 20 MG PO TABS: 20.0000 mg | ORAL_TABLET | Freq: Every day | ORAL | 2 refills | Status: DC

## 2016-10-25 MED ORDER — GLUCOSE BLOOD VI STRP: ORAL_STRIP | 11 refills | Status: DC

## 2016-10-25 MED FILL — ACCU-CHEK FASTCLIX LANCETS: 30 days supply | Qty: 102 | Fill #0

## 2016-10-25 MED FILL — ACCU-CHEK GUIDE TEST STRIP: 90 days supply | Qty: 100 | Fill #0

## 2016-10-28 ENCOUNTER — Other Ambulatory Visit: Payer: Self-pay

## 2016-10-28 ENCOUNTER — Ambulatory Visit (HOSPITAL_COMMUNITY)
Admission: RE | Admit: 2016-10-28 | Discharge: 2016-10-28 | Disposition: A | Discharge status: Home / Self Care | Payer: 59 | Source: Ambulatory Visit | Attending: General Surgery | Admitting: General Surgery

## 2016-10-28 DIAGNOSIS — K449 Diaphragmatic hernia without obstruction or gangrene: Secondary | ICD-10-CM | POA: Insufficient documentation

## 2016-10-28 DIAGNOSIS — Z01810 Encounter for preprocedural cardiovascular examination: Secondary | ICD-10-CM | POA: Diagnosis not present

## 2016-10-28 DIAGNOSIS — Z01818 Encounter for other preprocedural examination: Secondary | ICD-10-CM | POA: Insufficient documentation

## 2016-10-28 DIAGNOSIS — E669 Obesity, unspecified: Secondary | ICD-10-CM | POA: Diagnosis not present

## 2016-10-28 DIAGNOSIS — Z6841 Body Mass Index (BMI) 40.0 and over, adult: Secondary | ICD-10-CM | POA: Insufficient documentation

## 2016-10-28 DIAGNOSIS — K219 Gastro-esophageal reflux disease without esophagitis: Secondary | ICD-10-CM | POA: Diagnosis not present

## 2016-11-30 MED FILL — BUPROPION HCL XL 150 MG TAB: 150 | 90 days supply | Qty: 90 | Fill #2

## 2016-12-23 DIAGNOSIS — E119 Type 2 diabetes mellitus without complications: Secondary | ICD-10-CM | POA: Diagnosis not present

## 2016-12-23 DIAGNOSIS — H25013 Cortical age-related cataract, bilateral: Secondary | ICD-10-CM | POA: Diagnosis not present

## 2016-12-23 DIAGNOSIS — H43811 Vitreous degeneration, right eye: Secondary | ICD-10-CM | POA: Diagnosis not present

## 2016-12-23 DIAGNOSIS — H2513 Age-related nuclear cataract, bilateral: Secondary | ICD-10-CM | POA: Diagnosis not present

## 2016-12-23 LAB — HM DIABETES EYE EXAM

## 2016-12-29 ENCOUNTER — Encounter: Payer: Self-pay | Admitting: *Deleted

## 2017-01-05 MED FILL — ESCITALOPRAM 10 MG TABLET: 10 | 90 days supply | Qty: 90 | Fill #1

## 2017-01-05 MED FILL — PANTOPRAZOLE SOD DR 40 MG T: 40 | 90 days supply | Qty: 90 | Fill #2

## 2017-01-06 ENCOUNTER — Other Ambulatory Visit: Payer: Self-pay | Admitting: Internal Medicine

## 2017-01-06 ENCOUNTER — Encounter: Payer: Self-pay | Admitting: *Deleted

## 2017-01-06 DIAGNOSIS — E119 Type 2 diabetes mellitus without complications: Secondary | ICD-10-CM

## 2017-01-06 MED ORDER — INSULIN PEN NEEDLE 32G X 6 MM MISC
3 refills | Status: DC
Start: 2017-01-06 — End: 2018-08-08

## 2017-01-10 ENCOUNTER — Telehealth: Payer: Self-pay | Admitting: *Deleted

## 2017-01-10 MED FILL — UNIFINE PENTIPS 31GX3/16": 31G X 5 MM | 90 days supply | Qty: 100 | Fill #0

## 2017-01-10 MED FILL — UNIFINE PENTIPS 31GX3/16: 31G X 5 MM | 90 days supply | Qty: 100 | Fill #0

## 2017-01-10 NOTE — Telephone Encounter (Signed)
Received PA request from pt's pharmacy for [place].  Medication requires trial and failure of metformin/metformin sulfonylurea or pioglitazone combination.  Pt has tried metformin in the past.  Request submitted online via Cover My Meds.  Request sent for review.Regenia Skeeter, Darlene Cassady6/4/201811:41 AM

## 2017-01-13 MED FILL — VICTOZA 2-PAK 18 MG/3 ML PE: 18 | 90 days supply | Qty: 18 | Fill #0

## 2017-01-13 NOTE — Telephone Encounter (Signed)
Received faxed documentation that request has been approved from 01/07/2017 to 01/06/2018.Despina Hidden Cassady6/7/20183:47 PM

## 2017-01-20 ENCOUNTER — Other Ambulatory Visit: Payer: Self-pay | Admitting: Internal Medicine

## 2017-01-20 MED ORDER — PRAVASTATIN SODIUM 40 MG PO TABS: 40.0000 mg | ORAL_TABLET | Freq: Every day | ORAL | 3 refills | Status: DC

## 2017-01-20 MED FILL — PRAVASTATIN NA 40 MG TAB: 40 | 90 days supply | Qty: 90 | Fill #0

## 2017-01-20 MED FILL — VALSARTAN-HCTZ 160-25 MG TA: 160-25 | 90 days supply | Qty: 90 | Fill #2

## 2017-02-11 ENCOUNTER — Ambulatory Visit
Admission: RE | Admit: 2017-02-11 | Discharge: 2017-02-11 | Disposition: A | Discharge status: Home / Self Care | Payer: 59 | Source: Ambulatory Visit | Attending: Internal Medicine | Admitting: Internal Medicine

## 2017-02-11 DIAGNOSIS — Z1231 Encounter for screening mammogram for malignant neoplasm of breast: Secondary | ICD-10-CM | POA: Diagnosis not present

## 2017-02-15 ENCOUNTER — Ambulatory Visit (INDEPENDENT_AMBULATORY_CARE_PROVIDER_SITE_OTHER): Payer: 59 | Admitting: Sports Medicine

## 2017-02-15 DIAGNOSIS — M7660 Achilles tendinitis, unspecified leg: Secondary | ICD-10-CM

## 2017-02-15 NOTE — Progress Notes (Signed)
   Subjective:    Patient ID: Denise Stevens, female    DOB: March 15, 1966, 51 y.o.   MRN: [id number]  HPI chief complaint: Right heel pain  Patient comes in today complaining of 6 weeks of posterior right heel pain. No injury that she can recall but rather a gradual onset of pain that she describes as burning in quality. It is worse first thing in the morning. Also present with prolonged standing and walking. She has begun to notice a small amount of swelling. She's been using some Voltaren gel. She denies similar issues in the past. Denies pain elsewhere in the foot or ankle. No numbness or tingling.    Review of Systems As above    Objective:   Physical Exam  Obese. No acute distress  Right heel: Patient is tender to palpation at the insertion of the Achilles tendon onto the calcaneus. Small palpable spur. Slight tenderness to palpation along the medial and lateral aspects of the tendon where it inserts onto the calcaneus. No pain with Achilles stretching. Good strength. Neurovascularly intact distally. Walking with a slight limp.  MSK ultrasound of the right heel was performed. Limited images were obtained. There are a couple of small calcifications within the Achilles tendon just proximal to the insertion onto the calcaneus. There is thickening of the Achilles tendon in this area as well. Achilles tendon is intact. Findings are consistent with insertional calcific Achilles tendinopathy.      Assessment & Plan:   Right heel pain secondary to insertional calcific Achilles tendinopathy  5/16 inch heel lifts. Patient is educated in [place] heel drop exercises with instructions not to lower the heel past neutral. She will start icing 2-3 times a day. Continue with Voltaren gel. She understands the symptoms may take up to 6 weeks to resolve. If they do not, then we could consider a trial of topical nitroglycerin patches. She will follow-up for ongoing or recalcitrant issues.

## 2017-02-18 ENCOUNTER — Encounter: Payer: Self-pay | Admitting: Sports Medicine

## 2017-02-18 ENCOUNTER — Other Ambulatory Visit: Payer: Self-pay | Admitting: *Deleted

## 2017-02-18 MED ORDER — DICLOFENAC SODIUM 1 % TD GEL: 2.0000 g | Freq: Four times a day (QID) | TRANSDERMAL | 1 refills | Status: DC

## 2017-02-18 MED FILL — DICLOFENAC SODIUM 1% GEL: 1 | 13 days supply | Qty: 100 | Fill #0

## 2017-02-23 ENCOUNTER — Encounter: Payer: Self-pay | Admitting: Sports Medicine

## 2017-02-24 DIAGNOSIS — M7660 Achilles tendinitis, unspecified leg: Secondary | ICD-10-CM | POA: Diagnosis not present

## 2017-03-03 ENCOUNTER — Ambulatory Visit: Payer: 59 | Admitting: Sports Medicine

## 2017-03-03 ENCOUNTER — Encounter: Payer: Self-pay | Admitting: Sports Medicine

## 2017-03-04 ENCOUNTER — Other Ambulatory Visit: Payer: Self-pay | Admitting: *Deleted

## 2017-03-04 DIAGNOSIS — M7660 Achilles tendinitis, unspecified leg: Secondary | ICD-10-CM

## 2017-03-04 MED FILL — ACCU-CHEK GUIDE TEST STRIP: 90 days supply | Qty: 100 | Fill #1

## 2017-03-04 MED FILL — BUPROPION HCL XL 150 MG TAB: 150 | 90 days supply | Qty: 90 | Fill #3

## 2017-03-04 MED FILL — ACCU-CHEK FASTCLIX LANCETS: 30 days supply | Qty: 102 | Fill #1

## 2017-03-28 ENCOUNTER — Ambulatory Visit (INDEPENDENT_AMBULATORY_CARE_PROVIDER_SITE_OTHER): Payer: 59

## 2017-03-28 ENCOUNTER — Encounter: Payer: Self-pay | Admitting: Podiatry

## 2017-03-28 ENCOUNTER — Telehealth: Payer: Self-pay | Admitting: Pharmacist

## 2017-03-28 ENCOUNTER — Ambulatory Visit (INDEPENDENT_AMBULATORY_CARE_PROVIDER_SITE_OTHER): Payer: 59 | Admitting: Podiatry

## 2017-03-28 DIAGNOSIS — M7751 Other enthesopathy of right foot: Secondary | ICD-10-CM

## 2017-03-28 DIAGNOSIS — M79671 Pain in right foot: Secondary | ICD-10-CM

## 2017-03-28 DIAGNOSIS — R52 Pain, unspecified: Secondary | ICD-10-CM

## 2017-03-28 MED ORDER — BETAMETHASONE SOD PHOS & ACET 6 (3-3) MG/ML IJ SUSP: 3.0000 mg | Freq: Once | INTRAMUSCULAR | Status: DC

## 2017-03-28 MED ORDER — METHYLPREDNISOLONE 4 MG PO TBPK: ORAL_TABLET | ORAL | 0 refills | Status: DC

## 2017-03-28 MED FILL — METHYLPREDNISOLONE 4 MG TAB: 4 | 6 days supply | Qty: 21 | Fill #0

## 2017-03-28 NOTE — Progress Notes (Signed)
   Subjective:    Patient ID: Denise Stevens, female    DOB: 1966-04-17, 51 y.o.   MRN: [id number]  HPI  Chief Complaint  Patient presents with  . Foot Pain    Right, back of heel x 3 months. Pt states pain radiates up calf        Review of Systems  Gastrointestinal: Positive for constipation.  Musculoskeletal: Positive for arthralgias and back pain.  Neurological: Positive for dizziness.  All other systems reviewed and are negative.      Objective:   Physical Exam        Assessment & Plan:

## 2017-03-28 NOTE — Progress Notes (Signed)
Patient ID: Denise Stevens, female   DOB: 1966/07/07, 51 y.o.   MRN: [id number]   HPI: 51 year old female with a history of well-controlled type 2 diabetes mellitus presents today to the office for evaluation of pain to the back of the right heel. This pain has been going on for approximately 3 months now. Patient has been seen by Dr. Larey Dresser, and Dr. Everlean Alstrom, and ultrasound was performed. Patient was diagnosed with posterior heel spurs with Achilles tendinitis. Patient has also tried immobilization in a cam boot 10 days. Patient states that there is been no significant alleviation of symptoms the patient. Patient presents today for further treatment and evaluation   Physical Exam: General: The patient is alert and oriented x3 in no acute distress.  Dermatology: Skin is warm, dry and supple bilateral lower extremities. Negative for open lesions or macerations.  Vascular: Palpable pedal pulses bilaterally. No edema or erythema noted. Capillary refill within normal limits.  Neurological: Epicritic and protective threshold grossly intact bilaterally.   Musculoskeletal Exam: Range of motion within normal limits to all pedal and ankle joints bilateral. Muscle strength 5/5 in all groups bilateral. Pain on palpation noted to the very plantar aspect of the right posterior heel.  Radiographic Exam:  Normal osseous mineralization. Joint spaces preserved. No fracture/dislocation/boney destruction.  Posterior heel spur was visualized on lateral view noted.  Assessment: 1. Subacute Achilles bursitis right posterior heel   Plan of Care:  1. Patient was evaluated. X-rays reviewed today 2. Due to nonprogressive nature of the symptoms, today we injected 0.5 mL Celestone Soluspan into the sub-Achilles bursa of the right posterior heel. Patient understands all risks and benefits. Care was taken to avoid direct injection into the Achilles tendon 3. Prescription for Medrol Dosepak 4. Silicone  Achilles tendinitis heel sleeve dispensed today 5. Resume immobilization in the cam boot 2 weeks 6. Return to clinic in 2 weeks for follow-up treatment and evaluation   Edrick Kins, DPM [facility]  Dr. Edrick Kins, DPM    [address], [place] [address]                Office [phone]  Fax [phone]

## 2017-04-01 NOTE — Progress Notes (Signed)
Steroid-Induced Hyperglycemia Prevention and Management Denise Stevens is a 51 y.o. female who meets criteria for glucose monitoring program (diabetes patient prescribed short course of steroids).  A/P Current Regimen  Patient prescribed methylprednisolone dosepak, currently on day 4 of therapy.  Prednisone indication: bursitis  Current DM regimen liraglutide  Home BG Monitoring  Patient has a meter at home, has had trouble with glucose meter functioning/battery so has been checking BG as she can  BG during first 2 days of methylprednisolone were 120s and 130s, however 224 before bedtime on day 3, and 214 this morning  A1C prior to steroid course 8.4  S/Sx of hyper- or hypoglycemia: none reported  Medication Management  Additional treatment for BG control is indicated at this time: glipizide 5 mg daily, increase by 5 mg each day over the weekend if BG remains > 200  Physician preference level per protocol: 1  Medication supply patient has glipizide at home  Patient Education  Advised patient to monitor BG while on steroid therapy (at least twice daily).  Patient educated about signs/symptoms and advised to contact clinic if hyper- or hypoglycemic.  Patient did verbalize understanding of information and regimen by repeating back topics discussed.  Follow-up Daily, patient has a podiatry appointment 04/13/17  Flossie Dibble 9:59 AM 04/01/2017

## 2017-04-05 NOTE — Progress Notes (Signed)
On 04/03/17 (Saturday) fasting BG in the morning was 121, no signs or symptoms of concern other than headache for which she takes acetaminophen or Excedrin migraine. She states headaches are not new and she gets them from time to time. She inquired about NSAID and information was provided on safe use. Patient was advised to hold glipizide today.  04/04/17 (Sunday) morning fasting BG 94. Patient was advised to hold glipizide. Signing off of case due to last day of methylprednisolone therapy, but patient was advised to contact clinic if any questions/concerns.

## 2017-04-13 ENCOUNTER — Ambulatory Visit (INDEPENDENT_AMBULATORY_CARE_PROVIDER_SITE_OTHER): Payer: 59 | Admitting: Podiatry

## 2017-04-13 ENCOUNTER — Encounter: Payer: Self-pay | Admitting: Podiatry

## 2017-04-13 DIAGNOSIS — M7751 Other enthesopathy of right foot: Secondary | ICD-10-CM

## 2017-04-17 NOTE — Progress Notes (Signed)
Patient ID: Denise Stevens, female   DOB: [birth year], 51 y.o.   MRN: [id number]   HPI: 51 year old female with a history of well-controlled type 2 diabetes mellitus presents today to the office for follow up evaluation of pain to the posterior right heel. She states she is currently doing better. She reports relief with the injection and Medrol Dosepak. Patient presents today for further treatment and evaluation   Physical Exam: General: The patient is alert and oriented x3 in no acute distress.  Dermatology: Skin is warm, dry and supple bilateral lower extremities. Negative for open lesions or macerations.  Vascular: Palpable pedal pulses bilaterally. No edema or erythema noted. Capillary refill within normal limits.  Neurological: Epicritic and protective threshold grossly intact bilaterally.   Musculoskeletal Exam: Range of motion within normal limits to all pedal and ankle joints bilateral. Muscle strength 5/5 in all groups bilateral. Negative for pain on palpation the plantar aspect of the right posterior heel.  Assessment: 1. Subacute Achilles bursitis right posterior heel-resolved   Plan of Care:  1. Patient was evaluated.  2. Continue wearing good shoe gear. 3. Recommended daily stretching exercises.  4. Return to clinic when necessary.   Edrick Kins, DPM [facility]  Dr. Edrick Kins, DPM    [address], [place] [address]                Office [phone]  Fax [phone]

## 2017-04-21 ENCOUNTER — Other Ambulatory Visit: Payer: Self-pay | Admitting: Internal Medicine

## 2017-04-21 ENCOUNTER — Encounter: Payer: Self-pay | Admitting: Internal Medicine

## 2017-04-21 ENCOUNTER — Other Ambulatory Visit (INDEPENDENT_AMBULATORY_CARE_PROVIDER_SITE_OTHER): Payer: 59

## 2017-04-21 DIAGNOSIS — E041 Nontoxic single thyroid nodule: Secondary | ICD-10-CM

## 2017-04-21 DIAGNOSIS — E119 Type 2 diabetes mellitus without complications: Secondary | ICD-10-CM | POA: Diagnosis not present

## 2017-04-21 LAB — GLUCOSE, CAPILLARY: GLUCOSE-CAPILLARY: 108 mg/dL — AB (ref 65–99)

## 2017-04-21 LAB — POCT GLYCOSYLATED HEMOGLOBIN (HGB A1C): Hemoglobin A1C: 8

## 2017-04-21 MED ORDER — FLUTICASONE PROPIONATE 50 MCG/ACT NA SUSP: 2.0000 | Freq: Every day | NASAL | 3 refills | Status: DC

## 2017-04-21 NOTE — Addendum Note (Signed)
Addended by: Truddie Crumble on: 04/21/2017 03:24 PM   Modules accepted: Orders

## 2017-05-13 ENCOUNTER — Telehealth: Payer: Self-pay | Admitting: Podiatry

## 2017-05-13 NOTE — Telephone Encounter (Signed)
I'm in extreme pain with my achilles tendon. Please call me back. My direct dial at work is [phone] and you can leave a message or you can call me on my cell phone [phone]. Thank you.

## 2017-05-13 NOTE — Telephone Encounter (Signed)
I spoke with pt and told her I felt she would benefit from being seen the 1st part of next week by Dr. Amalia Hailey, and until seen in office wear the cam walker as much as possible, even to sleeping in it, this would hold the plantar fascia and the achilles stretched out and help with comfort and even possibly train the area to stay not so tight. I told pt to ice 3-4 times daily for 15-20 minutes per session until appt. Transferred pt to schedulers.

## 2017-05-17 ENCOUNTER — Other Ambulatory Visit: Payer: Self-pay | Admitting: Internal Medicine

## 2017-05-18 ENCOUNTER — Other Ambulatory Visit: Payer: Self-pay | Admitting: Internal Medicine

## 2017-05-18 ENCOUNTER — Ambulatory Visit (INDEPENDENT_AMBULATORY_CARE_PROVIDER_SITE_OTHER): Payer: 59 | Admitting: Podiatry

## 2017-05-18 DIAGNOSIS — M7751 Other enthesopathy of right foot: Secondary | ICD-10-CM

## 2017-05-18 MED ORDER — BETAMETHASONE SOD PHOS & ACET 6 (3-3) MG/ML IJ SUSP
3.0000 mg | Freq: Once | INTRAMUSCULAR | Status: DC
Start: 2017-05-18 — End: 2018-01-12

## 2017-05-18 MED FILL — PANTOPRAZOLE SOD DR 40 MG T: 40 | 90 days supply | Qty: 90 | Fill #0

## 2017-05-19 MED ORDER — IBUPROFEN-FAMOTIDINE 800-26.6 MG PO TABS: 1.0000 | ORAL_TABLET | Freq: Three times a day (TID) | ORAL | 1 refills | Status: DC

## 2017-05-24 NOTE — Progress Notes (Signed)
Patient ID: Denise Stevens, female   DOB: 15-Jan-1966, 51 y.o.   MRN: [id number]   HPI: 51 year old female with a history of well-controlled type 2 diabetes mellitus presents today to the office for follow up evaluation of pain to the posterior right lower extremity. She has been taking Aleve twice daily with some relief of the pain. She also has a new complaint stating there is a knot on the dorsum of the left foot that appeared 3 weeks ago. She has not done anything to treat the area. There are no modifying factors noted. Patient presents today for further treatment and evaluation.   Past Medical History:  Diagnosis Date  . Asthma    no intubation or hospitalization hx  . Depression   . Diabetes mellitus   . GERD (gastroesophageal reflux disease)   . HTN (hypertension)   . Hyperlipidemia       Physical Exam: General: The patient is alert and oriented x3 in no acute distress.  Dermatology: Skin is warm, dry and supple bilateral lower extremities. Negative for open lesions or macerations.  Vascular: Palpable pedal pulses bilaterally. No edema or erythema noted. Capillary refill within normal limits.  Neurological: Epicritic and protective threshold grossly intact bilaterally.   Musculoskeletal Exam: Pain on palpation noted to the posterior tubercle of the right calcaneus at the insertion of the Achilles tendon consistent with retrocalcaneal bursitis. Range of motion within normal limits. Muscle strength 5/5 in all muscle groups bilateral lower extremities.  Assessment: 1. Achilles tendinitis right   Plan of Care:  1. Patient was evaluated.  2. Injection of 0.5 mL Celestone Soluspan injected into the retrocalcaneal bursa. Care was taken to avoid direct injection into the Achilles tendon. 3. Night splint dispensed. 4. Ankle brace dispensed.  5. Prescription for Duexis given to patient. 6. Continue light stretching exercises. 7. Return to clinic in 4 weeks. If not better, physical  therapy will be ordered.   Edrick Kins, DPM [facility]  Dr. Edrick Kins, DPM    [address], [place] [address]                Office [phone]  Fax [phone]

## 2017-05-26 MED FILL — FLUTICASONE PROP 50 MCG SPR: 50 | 90 days supply | Qty: 48 | Fill #0

## 2017-06-03 ENCOUNTER — Ambulatory Visit (INDEPENDENT_AMBULATORY_CARE_PROVIDER_SITE_OTHER): Payer: 59 | Admitting: Podiatry

## 2017-06-03 ENCOUNTER — Encounter: Payer: Self-pay | Admitting: Podiatry

## 2017-06-03 DIAGNOSIS — M7751 Other enthesopathy of right foot: Secondary | ICD-10-CM | POA: Diagnosis not present

## 2017-06-03 DIAGNOSIS — M7661 Achilles tendinitis, right leg: Secondary | ICD-10-CM

## 2017-06-08 NOTE — Progress Notes (Signed)
Subjective:    Patient ID: Denise Stevens, female   DOB: 51 y.o.   MRN: [id number]   HPI patient states she aggravated her Achilles tendon on the right again and it's worse when she tries to walk    ROS      Objective:  Physical Exam neurovascular status intact with upon checking no indication of damage to the Achilles tendon. It is quite sore at the insertion into the calcaneus with mild discomfort slightly proximal to this area     Assessment:    Achilles tendinitis right that's been reaggravated with injury     Plan:    H&P condition reviewed and I've recommended at this time air fracture walker with complete immobilization and this was applied and tolerated well. Instructed on usage and patient will also do stretching exercises ice therapy and be seen back and may require MRI if symptoms persist

## 2017-06-09 ENCOUNTER — Other Ambulatory Visit: Payer: Self-pay | Admitting: Internal Medicine

## 2017-06-09 ENCOUNTER — Ambulatory Visit (HOSPITAL_COMMUNITY)
Admission: RE | Admit: 2017-06-09 | Discharge: 2017-06-09 | Disposition: A | Discharge status: Home / Self Care | Payer: 59 | Source: Ambulatory Visit | Attending: Internal Medicine | Admitting: Internal Medicine

## 2017-06-09 ENCOUNTER — Telehealth: Payer: Self-pay | Admitting: *Deleted

## 2017-06-09 DIAGNOSIS — E041 Nontoxic single thyroid nodule: Secondary | ICD-10-CM | POA: Insufficient documentation

## 2017-06-09 DIAGNOSIS — L299 Pruritus, unspecified: Secondary | ICD-10-CM

## 2017-06-09 MED FILL — ESCITALOPRAM 10 MG TABLET: 10 | 90 days supply | Qty: 90 | Fill #2

## 2017-06-09 MED FILL — VALSARTAN-HCTZ 160-25 MG TA: 160-25 | 90 days supply | Qty: 90 | Fill #0

## 2017-06-09 MED FILL — PRAVASTATIN NA 40 MG TAB: 40 | 90 days supply | Qty: 90 | Fill #1

## 2017-06-09 NOTE — Telephone Encounter (Signed)
CALLED Snigdha REGARDING HER ENT REFERRAL AS TO WHICH DR. SHE WOULD LIKE [place] TO SEND HER REFERRAL TO.

## 2017-06-15 ENCOUNTER — Ambulatory Visit (INDEPENDENT_AMBULATORY_CARE_PROVIDER_SITE_OTHER): Payer: 59 | Admitting: Podiatry

## 2017-06-15 DIAGNOSIS — M7751 Other enthesopathy of right foot: Secondary | ICD-10-CM

## 2017-06-15 DIAGNOSIS — M7661 Achilles tendinitis, right leg: Secondary | ICD-10-CM

## 2017-06-15 MED ORDER — METHYLPREDNISOLONE 4 MG PO TBPK: ORAL_TABLET | ORAL | 0 refills | Status: DC

## 2017-06-15 MED FILL — METHYLPREDNISOLONE 4 MG TAB: 4 | 6 days supply | Qty: 21 | Fill #0

## 2017-06-19 NOTE — Progress Notes (Signed)
   HPI: 51 year old female presents today for follow up evaluation of right achilles tendinitis. She reports continued pain rating it at 4/10 at this time. She has been wearing the CAM boot which helps alleviate some of the pain while she is at work. Walking increases the pain. She is here for further evaluation and treatment.    Past Medical History:  Diagnosis Date  . Asthma    no intubation or hospitalization hx  . Depression   . Diabetes mellitus   . GERD (gastroesophageal reflux disease)   . HTN (hypertension)   . Hyperlipidemia       Physical Exam: General: The patient is alert and oriented x3 in no acute distress.  Dermatology: Skin is warm, dry and supple bilateral lower extremities. Negative for open lesions or macerations.  Vascular: Palpable pedal pulses bilaterally. No edema or erythema noted. Capillary refill within normal limits.  Neurological: Epicritic and protective threshold grossly intact bilaterally.   Musculoskeletal Exam: Pain on palpation noted to the posterior tubercle of the right calcaneus at the insertion of the Achilles tendon consistent with retrocalcaneal bursitis. Range of motion within normal limits. Muscle strength 5/5 in all muscle groups bilateral lower extremities.  Assessment: 1. Insertional Achilles tendinitis right 2. Sub-achilles bursitis right   Plan of Care:  1. Patient was evaluated.  2. Injection of 0.5 mL Celestone Soluspan injected into the retrocalcaneal bursa. Care was taken to avoid direct injection into the Achilles tendon. 3. Prescription for Medrol Dose Pak given to patient.  4. Continue taking Duexis as directed. 5. Continue weightbearing in CAM boot. 6. Return to clinic in 4 weeks.   Edrick Kins, DPM [facility]  Dr. Edrick Kins, [address]                                        [place], [place] [address]                Office [phone]  Fax [phone]

## 2017-06-21 ENCOUNTER — Encounter: Payer: Self-pay | Admitting: Podiatry

## 2017-06-25 NOTE — Telephone Encounter (Signed)
Denise Stevens, Please order MRI w/ or w/out contrast right ankle.  Dx: Achilles tendinitis vs. Tear RLE.   Thanks, Dr. Amalia Hailey

## 2017-06-27 ENCOUNTER — Telehealth: Payer: Self-pay | Admitting: *Deleted

## 2017-06-27 DIAGNOSIS — M7661 Achilles tendinitis, right leg: Secondary | ICD-10-CM

## 2017-06-27 DIAGNOSIS — T148XXA Other injury of unspecified body region, initial encounter: Secondary | ICD-10-CM

## 2017-06-27 NOTE — Telephone Encounter (Signed)
Pt emailed her appt for MRI is 07/08/2017.

## 2017-06-27 NOTE — Telephone Encounter (Signed)
Pt emailed Dr. Amalia Hailey, her right foot was not improving. Dr. Amalia Hailey ordered MRI without contrast for right ankle. Orders faxed to [facility] scheduling and given to Gretta Arab, RN for pre-cert.

## 2017-06-29 MED ORDER — IBUPROFEN-FAMOTIDINE 800-26.6 MG PO TABS: ORAL_TABLET | ORAL | 1 refills | Status: DC

## 2017-06-29 NOTE — Addendum Note (Signed)
Addended by: Tomi Bamberger on: 06/29/2017 04:11 PM   Modules accepted: Orders

## 2017-07-05 ENCOUNTER — Encounter: Payer: Self-pay | Admitting: Student in an Organized Health Care Education/Training Program

## 2017-07-05 ENCOUNTER — Ambulatory Visit (INDEPENDENT_AMBULATORY_CARE_PROVIDER_SITE_OTHER): Payer: 59 | Admitting: Student in an Organized Health Care Education/Training Program

## 2017-07-05 ENCOUNTER — Other Ambulatory Visit: Payer: Self-pay

## 2017-07-05 DIAGNOSIS — M1712 Unilateral primary osteoarthritis, left knee: Secondary | ICD-10-CM | POA: Diagnosis not present

## 2017-07-05 LAB — SYNOVIAL CELL COUNT + DIFF, W/ CRYSTALS
Crystals, Fluid: NONE SEEN
Eosinophils-Synovial: 0 % (ref 0–1)
LYMPHOCYTES-SYNOVIAL FLD: 28 % — AB (ref 0–20)
MONOCYTE-MACROPHAGE-SYNOVIAL FLUID: 57 % (ref 50–90)
Neutrophil, Synovial: 15 % (ref 0–25)
WBC, Synovial: 177 /mm3 (ref 0–200)

## 2017-07-05 NOTE — Progress Notes (Signed)
   Assessment and Plan:  See Encounters tab for problem-based medical decision making.   __________________________________________________________  HPI:   51 year old woman came to clinic today for follow up of left knee pain. She has a history of knee pain, received a steroid injection into the joint several years ago with good long term relief. She has had intermittent symptoms of pain with walking down steps, pain upon standing, mild sensation the knee may give out. No locking. Mild pressure and swelling in the knee. She noticed worsening pain last week after walking a lot around [facility] in one day. No fevers, no chills. The pain is starting to limit her functioning. She has tried naproxen but with minimal relief.   __________________________________________________________  Problem List: Patient Active Problem List   Diagnosis Date Noted  . Osteoarthritis of left knee 07/05/2017  . Thyroid nodule 06/26/2016  . Cervical disc disorder with radiculopathy of cervical region 04/01/2016  . Greater trochanteric bursitis of both hips 04/01/2016  . Tendinopathy of rotator cuff 08/27/2015  . Epicondylitis, medial humeral 08/27/2015  . Hyperlipidemia associated with type 2 diabetes mellitus ([place]) 06/05/2015  . GERD (gastroesophageal reflux disease) 06/05/2015  . Allergic rhinitis 06/05/2015  . Keratoconjunctivitis sicca ([place]) 06/05/2015  . Health care maintenance 06/05/2015  . Morbid obesity ([place]) 03/31/2015  . Carpal tunnel syndrome, left 12/15/2014  . Essential hypertension 08/06/2014  . Dysphoric mood 08/06/2014  . Obstructive sleep apnea 01/21/2014  . Diabetes mellitus type 2, controlled, without complications ([place]) 84/[id number]/6295  . TROCHANTERIC BURSITIS, LEFT 01/17/2009  . UNEQUAL LEG LENGTH 01/17/2009    Medications: Reconciled today in Epic __________________________________________________________  Physical Exam:  Vital Signs: Vitals:   07/05/17 1511  BP: (!) 149/80    Pulse: 82  Temp: 98.1 F (36.7 C)  TempSrc: Oral  SpO2: 100%  Weight: (!) 322 lb 14.4 oz (146.5 kg)  Height: 5\' 5"  (1.651 m)    Gen: Well appearing, NAD Ext: Left knee has tenderness over the suprapatellar pouch, there is crepitus below the patella, there is no laxity, no joint space tenderness, negative Mcmurray, negative Lachmans testing. No errythema or warmth.

## 2017-07-05 NOTE — Assessment & Plan Note (Signed)
Recurrent effusion of the left knee. Exam most consistent with patellofemoral syndrome. Effusion likely inflammatory from overuse recently with underlying OA. She failed conservative therapy with Naproxen and pain became function limiting. We did an arthrocentesis today under ultrasound and drained 18ml of clear synovial fluid, and injected 40mg  of triamcinolone. Will send fluid to rule out crystal disease. Can repeat in three months if needed, but hopefully she will have long term relief with this.   Knee Arthrocentesis with Injection Procedure Note  Diagnosis: left knee osteoarthritis  Indications: Symptomatic relief of large effusion  Anesthesia: Lidocaine 1% without epinephrine  Procedure Details   Point of care ultrasound was used to identify the joint effusion and plan needle trajectory and depth. Consent was obtained for the procedure. The joint was prepped with Betadine. A 18 gauge needle was inserted into the superior aspect of the joint from a medial approach to access the suprapatellar pouch. 16 ml of cloudy yellow fluid was removed from the joint and sent to the lab for analysis. 2 ml 1% lidocaine and 40 mg of Triamcinolone was then injected into the joint through the same needle. The needle was removed and the area cleansed and dressed.  Complications:  None; patient tolerated the procedure well.   Long axis view of the suprapatellar pouch showing a moderate amount of anechoic fluid. This was drained, post-procedure ultrasound showed no residual effusion and steroid crystals in the suprapatellar pouch.

## 2017-07-08 ENCOUNTER — Ambulatory Visit (HOSPITAL_COMMUNITY)
Admission: RE | Admit: 2017-07-08 | Discharge: 2017-07-08 | Disposition: A | Discharge status: Home / Self Care | Payer: 59 | Source: Ambulatory Visit | Attending: Podiatry | Admitting: Podiatry

## 2017-07-08 DIAGNOSIS — M7661 Achilles tendinitis, right leg: Secondary | ICD-10-CM | POA: Diagnosis not present

## 2017-07-08 DIAGNOSIS — T148XXA Other injury of unspecified body region, initial encounter: Secondary | ICD-10-CM | POA: Diagnosis present

## 2017-07-08 DIAGNOSIS — M779 Enthesopathy, unspecified: Secondary | ICD-10-CM | POA: Insufficient documentation

## 2017-07-11 DIAGNOSIS — L299 Pruritus, unspecified: Secondary | ICD-10-CM | POA: Diagnosis not present

## 2017-07-11 DIAGNOSIS — H60543 Acute eczematoid otitis externa, bilateral: Secondary | ICD-10-CM | POA: Insufficient documentation

## 2017-07-11 MED FILL — ALCLOMETASONE DIPRO 0.05% C: 0.05 | 14 days supply | Qty: 15 | Fill #0

## 2017-07-13 ENCOUNTER — Ambulatory Visit (INDEPENDENT_AMBULATORY_CARE_PROVIDER_SITE_OTHER): Payer: 59 | Admitting: Podiatry

## 2017-07-13 DIAGNOSIS — M7751 Other enthesopathy of right foot: Secondary | ICD-10-CM | POA: Diagnosis not present

## 2017-07-13 DIAGNOSIS — M7661 Achilles tendinitis, right leg: Secondary | ICD-10-CM | POA: Diagnosis not present

## 2017-07-13 MED FILL — UNIFINE PENTIPS 31GX3/16: 31G X 5 MM | 90 days supply | Qty: 100 | Fill #1

## 2017-07-13 MED FILL — UNIFINE PENTIPS 31GX3/16": 31G X 5 MM | 90 days supply | Qty: 100 | Fill #1

## 2017-07-17 NOTE — Progress Notes (Signed)
   HPI: 51 year old female presenting today for follow-up evaluation of right insertional Achilles tendinitis.  She is here to review her MRI results.  She states the pain is significantly better.  Patient is here for further evaluation and treatment.   Past Medical History:  Diagnosis Date  . Asthma    no intubation or hospitalization hx  . Depression   . Diabetes mellitus   . GERD (gastroesophageal reflux disease)   . HTN (hypertension)   . Hyperlipidemia      Physical Exam: General: The patient is alert and oriented x3 in no acute distress.  Dermatology: Skin is warm, dry and supple bilateral lower extremities. Negative for open lesions or macerations.  Vascular: Palpable pedal pulses bilaterally. No edema or erythema noted. Capillary refill within normal limits.  Neurological: Epicritic and protective threshold grossly intact bilaterally.   Musculoskeletal Exam: Range of motion within normal limits to all pedal and ankle joints bilateral. Muscle strength 5/5 in all groups bilateral.   MRI Impression: Mild tendinosis of the Achilles tendon adjacent to its insertion with mild enthesopathic changes.    Assessment: -Insertional Achilles tendinitis right - resolved   Plan of Care:  -Patient evaluated.  MRI reviewed. -Recommended light stretching. -Continue taking Duexis as needed. -Recommended good shoe gear. -Return to clinic as needed.   Edrick Kins, DPM [facility]  Dr. Edrick Kins, DPM    [address], [place] [address]                Office [phone]  Fax [phone]

## 2017-07-21 ENCOUNTER — Ambulatory Visit (INDEPENDENT_AMBULATORY_CARE_PROVIDER_SITE_OTHER): Payer: 59 | Admitting: Internal Medicine

## 2017-07-21 ENCOUNTER — Encounter: Payer: Self-pay | Admitting: Internal Medicine

## 2017-07-21 VITALS — BP 121/69 | HR 95 | Wt 317.2 lb

## 2017-07-21 DIAGNOSIS — I10 Essential (primary) hypertension: Secondary | ICD-10-CM | POA: Diagnosis not present

## 2017-07-21 DIAGNOSIS — E1169 Type 2 diabetes mellitus with other specified complication: Secondary | ICD-10-CM | POA: Diagnosis not present

## 2017-07-21 DIAGNOSIS — H9209 Otalgia, unspecified ear: Secondary | ICD-10-CM | POA: Diagnosis not present

## 2017-07-21 DIAGNOSIS — J301 Allergic rhinitis due to pollen: Secondary | ICD-10-CM

## 2017-07-21 DIAGNOSIS — E119 Type 2 diabetes mellitus without complications: Secondary | ICD-10-CM | POA: Diagnosis not present

## 2017-07-21 DIAGNOSIS — E785 Hyperlipidemia, unspecified: Secondary | ICD-10-CM | POA: Diagnosis not present

## 2017-07-21 DIAGNOSIS — Z794 Long term (current) use of insulin: Secondary | ICD-10-CM

## 2017-07-21 DIAGNOSIS — R4589 Other symptoms and signs involving emotional state: Secondary | ICD-10-CM

## 2017-07-21 DIAGNOSIS — L918 Other hypertrophic disorders of the skin: Secondary | ICD-10-CM | POA: Diagnosis not present

## 2017-07-21 DIAGNOSIS — K219 Gastro-esophageal reflux disease without esophagitis: Secondary | ICD-10-CM | POA: Diagnosis not present

## 2017-07-21 DIAGNOSIS — J309 Allergic rhinitis, unspecified: Secondary | ICD-10-CM | POA: Diagnosis not present

## 2017-07-21 DIAGNOSIS — J029 Acute pharyngitis, unspecified: Secondary | ICD-10-CM

## 2017-07-21 DIAGNOSIS — F341 Dysthymic disorder: Secondary | ICD-10-CM

## 2017-07-21 DIAGNOSIS — R599 Enlarged lymph nodes, unspecified: Secondary | ICD-10-CM

## 2017-07-21 DIAGNOSIS — Z0000 Encounter for general adult medical examination without abnormal findings: Secondary | ICD-10-CM

## 2017-07-21 DIAGNOSIS — Z79899 Other long term (current) drug therapy: Secondary | ICD-10-CM

## 2017-07-21 LAB — POCT GLYCOSYLATED HEMOGLOBIN (HGB A1C): Hemoglobin A1C: 7.7

## 2017-07-21 LAB — GLUCOSE, CAPILLARY: Glucose-Capillary: 186 mg/dL — ABNORMAL HIGH (ref 65–99)

## 2017-07-21 MED ORDER — ESCITALOPRAM OXALATE 10 MG PO TABS: 10.0000 mg | ORAL_TABLET | Freq: Every day | ORAL | 3 refills | Status: DC

## 2017-07-21 MED ORDER — LIRAGLUTIDE 18 MG/3ML ~~LOC~~ SOPN: 1.2000 mg | PEN_INJECTOR | Freq: Every day | SUBCUTANEOUS | 3 refills | Status: DC

## 2017-07-21 MED ORDER — SITAGLIPTIN PHOSPHATE 100 MG PO TABS: 100.0000 mg | ORAL_TABLET | Freq: Every day | ORAL | 3 refills | Status: DC

## 2017-07-21 MED ORDER — OSELTAMIVIR PHOSPHATE 75 MG PO CAPS: 75.0000 mg | ORAL_CAPSULE | Freq: Every day | ORAL | 0 refills | Status: AC

## 2017-07-21 MED FILL — OSELTAMIVIR PHOSPHATE 75 MG: 75 | 7 days supply | Qty: 7 | Fill #0

## 2017-07-21 MED FILL — JANUVIA 100 MG TABLET: 100 | 90 days supply | Qty: 90 | Fill #0

## 2017-07-21 MED FILL — VICTOZA 2-PAK 18 MG/3 ML PE: 18 | 90 days supply | Qty: 18 | Fill #0

## 2017-07-21 NOTE — Assessment & Plan Note (Signed)
This problem is chronic and stable. She uses Zyrtec and Flonase as needed.  PLAN:  Cont current meds

## 2017-07-21 NOTE — Progress Notes (Signed)
   Subjective:    Patient ID: Denise Stevens, female    DOB: August 14, 1965, 51 y.o.   MRN: [id number]  HPI  Denise Stevens is here for DM F/U. Please see the A&P for the status of the pt's chronic medical problems.  ROS : per ROS section and in problem oriented charting. All other systems are negative.  PMHx, Soc hx, and / or Fam hx : Son Georgina Snell dx flu yesterday clinically and on Tamiflu.   Review of Systems  Constitutional: Negative for unexpected weight change.  HENT: Positive for ear pain and sore throat.   Gastrointestinal: Negative for diarrhea.  Musculoskeletal: Negative for arthralgias.  Skin:       + skin tag L axilla       Objective:   Physical Exam  Constitutional: She appears well-developed and well-nourished. No distress.  HENT:  Head: Normocephalic and atraumatic.  Right Ear: External ear normal.  Left Ear: External ear normal.  Nose: Nose normal.  Eyes: Conjunctivae and EOM are normal. Right eye exhibits no discharge. Left eye exhibits no discharge. No scleral icterus.  Neck: Normal range of motion. Neck supple. No thyromegaly present.  Cardiovascular: Normal rate, regular rhythm and normal heart sounds.  Pulmonary/Chest: Effort normal and breath sounds normal. No respiratory distress.  Lymphadenopathy:    She has cervical adenopathy.  Skin: Skin is warm and dry. She is not diaphoretic.  Psychiatric: She has a normal mood and affect. Her behavior is normal. Judgment and thought content normal.      Assessment & Plan:

## 2017-07-21 NOTE — Assessment & Plan Note (Signed)
NEW PROBLEM: Her son has a clinical diagnosis of influenza which was made yesterday. She request a prescription for Tamiflu to be used prophylactically. I sent that in, 75 mg once a day, to the pharmacy.

## 2017-07-21 NOTE — Assessment & Plan Note (Signed)
This problem is chronic and stable. Her ten-year Framingham risk is 2%. She is on Pravachol 40, moderate intensity statin, for primary prevention. She is having no side effects to these medications.  PLAN:  Cont current meds

## 2017-07-21 NOTE — Assessment & Plan Note (Signed)
This problem is chronic and stable. On a PPI daily and symptoms are usually controlled. No side effects to this medication.  PLAN:  Cont current meds

## 2017-07-21 NOTE — Assessment & Plan Note (Signed)
This problem is chronic and well controlled. Her blood pressure is at goal on valsartan 160 and HCTZ 25. She is having no side effects to these medications.  PLAN:  Cont current meds   BP Readings from Last 3 Encounters:  07/21/17 121/69  07/05/17 (!) 149/80  02/15/17 (!) 148/90

## 2017-07-21 NOTE — Assessment & Plan Note (Signed)
This problem is chronic and stable. She continues on Lexapro 10 and Wellbutrin 150. She also has Xanax to use when necessary. Mood fluctuates and usually is worsened by family stressors. This time of the year is always a difficult time due to the death of her father. 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 We decided to continue her current medication and readdress in the spring. (lexapro increased for hot flushes).  PLAN : Increase Lexapro to 20 Continue Wellbutrin 150

## 2017-07-21 NOTE — Assessment & Plan Note (Signed)
This problem is chronic and stable. A1c trend has been 8.4 - 8.0 - 7.7. She is on Victoza 1.2 daily and has been taking it daily for the past 2 months. She uses glipizide whenever she has to be on a prednisone course. Very infrequent home CBG monitoring. A1c is not at goal.  PLAN : Continue Actos  Start Januvia 50 once a day

## 2017-07-26 MED ORDER — ESCITALOPRAM OXALATE 10 MG PO TABS: 20.0000 mg | ORAL_TABLET | Freq: Every day | ORAL | 3 refills | Status: DC

## 2017-07-26 NOTE — Addendum Note (Signed)
Addended by: Larey Dresser A on: 07/26/2017 10:55 AM   Modules accepted: Orders

## 2017-07-28 ENCOUNTER — Encounter: Payer: Self-pay | Admitting: Internal Medicine

## 2017-07-28 ENCOUNTER — Other Ambulatory Visit: Payer: Self-pay | Admitting: Internal Medicine

## 2017-07-28 MED ORDER — METRONIDAZOLE 500 MG PO TABS: 500.0000 mg | ORAL_TABLET | Freq: Two times a day (BID) | ORAL | 0 refills | Status: AC

## 2017-07-28 MED FILL — metroNIDAZOLE 500 MG TABS: 500 | 7 days supply | Qty: 14 | Fill #0

## 2017-08-10 ENCOUNTER — Encounter: Payer: Self-pay | Admitting: Podiatry

## 2017-09-01 ENCOUNTER — Other Ambulatory Visit: Payer: Self-pay | Admitting: Internal Medicine

## 2017-09-01 MED FILL — ESCITALOPRAM 10 MG TABLET: 10 | 90 days supply | Qty: 180 | Fill #0

## 2017-09-02 MED FILL — buPROPion HCL ER (XL) 150 M: 150 | 90 days supply | Qty: 90 | Fill #0

## 2017-09-02 MED FILL — ACCU-CHEK FASTCLIX LANCETS: 30 days supply | Qty: 102 | Fill #2

## 2017-09-02 MED FILL — ACCU-CHEK GUIDE TEST STRIP: 33 days supply | Qty: 100 | Fill #2

## 2017-09-06 ENCOUNTER — Other Ambulatory Visit: Payer: Self-pay | Admitting: Internal Medicine

## 2017-09-06 MED ORDER — HYDROCORTISONE-ACETIC ACID 1-2 % OT SOLN: 4.0000 [drp] | Freq: Three times a day (TID) | OTIC | 1 refills | Status: DC

## 2017-09-07 MED FILL — HYDROCORTISON-ACETIC ACID S: 1-2 | 7 days supply | Qty: 10 | Fill #0

## 2017-09-08 ENCOUNTER — Encounter: Payer: Self-pay | Admitting: Internal Medicine

## 2017-09-12 ENCOUNTER — Telehealth: Payer: 59 | Admitting: Family

## 2017-09-12 DIAGNOSIS — Z719 Counseling, unspecified: Secondary | ICD-10-CM

## 2017-09-12 NOTE — Progress Notes (Signed)
Cancel e-visit. This is done for a minor on another person's chart.

## 2017-09-13 ENCOUNTER — Other Ambulatory Visit: Payer: Self-pay | Admitting: Internal Medicine

## 2017-09-13 MED ORDER — OSELTAMIVIR PHOSPHATE 75 MG PO CAPS: 75.0000 mg | ORAL_CAPSULE | Freq: Every day | ORAL | 0 refills | Status: DC

## 2017-09-13 MED FILL — OSELTAMIVIR PHOSPHATE 75 MG: 75 | 7 days supply | Qty: 7 | Fill #0

## 2017-09-13 NOTE — Progress Notes (Signed)
Son tested + flu A today. Denise Stevens's mother lives with her and Denise Stevens. She is at high risk for flu complications as sh has CKD, DM, HTN, anemia, and h/o breast cancer. Cannot get PCP on phone. GFR 30. Sulfa allergy  [facility] Rx tamiflu 70 QD 7 days Ms Jerline Pain 30 mg po QD 7 days.

## 2017-09-20 ENCOUNTER — Other Ambulatory Visit: Payer: Self-pay | Admitting: Internal Medicine

## 2017-09-20 MED ORDER — HYDROCORTISONE-ACETIC ACID 1-2 % OT SOLN: 4.0000 [drp] | Freq: Three times a day (TID) | OTIC | 1 refills | Status: AC

## 2017-10-03 ENCOUNTER — Encounter: Payer: Self-pay | Admitting: Internal Medicine

## 2017-10-03 MED FILL — AMOXICILLIN 500 MG CAPSULE: 500 | 10 days supply | Qty: 30 | Fill #0

## 2017-10-05 ENCOUNTER — Ambulatory Visit: Payer: 59 | Admitting: Podiatry

## 2017-10-05 DIAGNOSIS — M7661 Achilles tendinitis, right leg: Secondary | ICD-10-CM | POA: Diagnosis not present

## 2017-10-09 NOTE — Progress Notes (Signed)
   HPI: 52 year old female presenting today for follow-up evaluation of right insertional Achilles tendinitis. She reports the pain flared up beginning one week ago. She states it is progressively worsening likely due to an increase in her driving lately. She has been taking Duexis for pain. Patient is here for further evaluation and treatment.   Past Medical History:  Diagnosis Date  . Asthma    no intubation or hospitalization hx  . Depression   . Diabetes mellitus   . GERD (gastroesophageal reflux disease)   . HTN (hypertension)   . Hyperlipidemia      Physical Exam: General: The patient is alert and oriented x3 in no acute distress.  Dermatology: Skin is warm, dry and supple bilateral lower extremities. Negative for open lesions or macerations.  Vascular: Palpable pedal pulses bilaterally. No edema or erythema noted. Capillary refill within normal limits.  Neurological: Epicritic and protective threshold grossly intact bilaterally.   Musculoskeletal Exam: Pain on palpation noted to the posterior tubercle of the right calcaneus at the insertion of the Achilles tendon consistent with retrocalcaneal bursitis. Range of motion within normal limits to all pedal and ankle joints bilateral. Muscle strength 5/5 in all groups bilateral.   Assessment: -Insertional Achilles tendinitis right    Plan of Care:  - Patient evaluated.  - Injection of 0.5 mLs of Celestone Soluspan injected into the retrocalcaneal bursa. Care was taken to avoid direct injection into the tendon. - Resume weightbearing in the CAM boot for one week. - Continue taking Duexis.  - Orders for physical therapy three times weekly for four weeks placed. - Return to clinic in 4 weeks.     Edrick Kins, DPM [facility]  Dr. Edrick Kins, DPM    [address], [place] [address]                Office [phone]  Fax [phone]

## 2017-10-14 ENCOUNTER — Encounter: Payer: Self-pay | Admitting: Internal Medicine

## 2017-10-19 ENCOUNTER — Other Ambulatory Visit: Payer: Self-pay | Admitting: Internal Medicine

## 2017-10-19 DIAGNOSIS — E119 Type 2 diabetes mellitus without complications: Secondary | ICD-10-CM

## 2017-10-26 MED FILL — PANTOPRAZOLE SOD DR 40 MG T: 40 | 90 days supply | Qty: 90 | Fill #1

## 2017-10-28 ENCOUNTER — Telehealth: Payer: Self-pay | Admitting: *Deleted

## 2017-10-28 NOTE — Telephone Encounter (Signed)
[place] states pt was scheduled for 10/11/2017 and cancelled due to family emergency, and moved appt to 10/28/2017 and No Show.

## 2017-11-01 MED FILL — VALSARTAN-HCTZ 160-25 MG TA: 160-25 | 90 days supply | Qty: 90 | Fill #1

## 2017-11-01 MED FILL — PRAVASTATIN NA 40 MG TAB: 40 | 90 days supply | Qty: 90 | Fill #2

## 2017-11-01 MED FILL — JANUVIA 100 MG TABLET: 100 | 90 days supply | Qty: 90 | Fill #1

## 2017-11-02 ENCOUNTER — Ambulatory Visit: Payer: 59 | Admitting: Podiatry

## 2017-11-02 DIAGNOSIS — M7661 Achilles tendinitis, right leg: Secondary | ICD-10-CM | POA: Diagnosis not present

## 2017-11-02 MED ORDER — TRAMADOL HCL 50 MG PO TABS: 50.0000 mg | ORAL_TABLET | Freq: Three times a day (TID) | ORAL | 0 refills | Status: DC | PRN

## 2017-11-02 MED ORDER — MELOXICAM 15 MG PO TABS: 15.0000 mg | ORAL_TABLET | Freq: Every day | ORAL | 1 refills | Status: AC

## 2017-11-02 MED FILL — traMADol HCL 50 MG TABS: 50 | 10 days supply | Qty: 30 | Fill #0

## 2017-11-02 MED FILL — MELOXICAM 15 MG TABLET: 15 | 60 days supply | Qty: 60 | Fill #0

## 2017-11-03 DIAGNOSIS — G4733 Obstructive sleep apnea (adult) (pediatric): Secondary | ICD-10-CM | POA: Diagnosis not present

## 2017-11-04 ENCOUNTER — Other Ambulatory Visit: Payer: Self-pay | Admitting: Internal Medicine

## 2017-11-04 DIAGNOSIS — G4733 Obstructive sleep apnea (adult) (pediatric): Secondary | ICD-10-CM

## 2017-11-08 NOTE — Progress Notes (Signed)
   HPI: Patient presents today for follow-up treatment and evaluation regarding right Achilles tendinitis.  Last visit on 10/05/2017 physical therapy was prescribed however she has not been able to start physical therapy.  She is currently taking Duexis anti-inflammatory.  She also has not been able to immobilize the Achilles tendon in the immobilization cam boot.  She has now had the pain for several months which is been intermittent with periods of improvement as well as periods of increased pain and sensitivity.  She presents for further treatment evaluation  Past Medical History:  Diagnosis Date  . Asthma    no intubation or hospitalization hx  . Depression   . Diabetes mellitus   . GERD (gastroesophageal reflux disease)   . HTN (hypertension)   . Hyperlipidemia      Physical Exam: General: The patient is alert and oriented x3 in no acute distress.  Dermatology: Skin is warm, dry and supple bilateral lower extremities. Negative for open lesions or macerations.  Vascular: Palpable pedal pulses bilaterally. No edema or erythema noted. Capillary refill within normal limits.  Neurological: Epicritic and protective threshold grossly intact bilaterally.   Musculoskeletal Exam: Pain on palpation noted to the posterior tubercle of the right calcaneus at the insertion of the Achilles tendon consistent with insertional Achilles tendinitis/likely retrocalcaneal bursitis.  Range of motion within normal limits to all pedal and ankle joints bilateral. Muscle strength 5/5 in all groups bilateral.   Assessment: -Insertional Achilles tendinitis right lower extremity   Plan of Care:  - Patient evaluated.  -Prescription for meloxicam 15 mg.  Discontinue Duexis -Today we are going to reinitiate physical therapy 3 times per week times 4 weeks -Today we also discussed additional conservative modalities to alleviate the patient's symptoms.  Recommend EPAT shockwave therapy.  Another cost is an issue for  the patient some going to see if we can provide the patient this therapeutic modality at a more affordable cost for the patient.  We will contact the patient and follow-up to see if she would like to initiate shockwave. -Return to clinic in 6 weeks    Edrick Kins, DPM [facility]  Dr. Edrick Kins, DPM    [address], [place] [address]                Office [phone]  Fax [phone]

## 2017-11-29 ENCOUNTER — Other Ambulatory Visit (INDEPENDENT_AMBULATORY_CARE_PROVIDER_SITE_OTHER): Payer: 59

## 2017-11-29 DIAGNOSIS — E119 Type 2 diabetes mellitus without complications: Secondary | ICD-10-CM | POA: Diagnosis not present

## 2017-11-29 LAB — POCT GLYCOSYLATED HEMOGLOBIN (HGB A1C): Hemoglobin A1C: 6.9

## 2017-11-29 LAB — GLUCOSE, CAPILLARY: Glucose-Capillary: 117 mg/dL — ABNORMAL HIGH (ref 65–99)

## 2017-12-06 IMAGING — US US SOFT TISSUE HEAD/NECK
1 series · 5 of 5 positions shown · non-contrast
Comparison: 06/07/2016

CLINICAL DATA: 50-year-old female with a history of thyroid nodule

EXAM:
THYROID ULTRASOUND
TECHNIQUE: Ultrasound examination of the thyroid gland and adjacent soft
tissues was performed.

[Series 1: us soft tissue head/neck · 5 acquisitions, 5 frames shown]
[im 1/5]
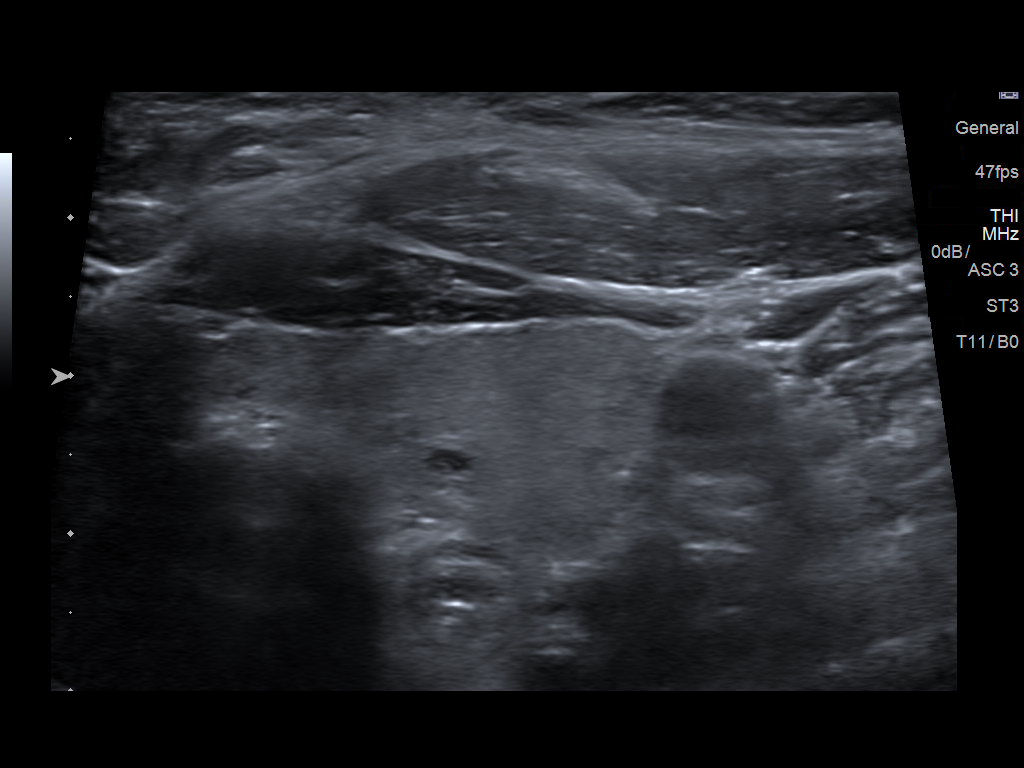
[im 2/5]
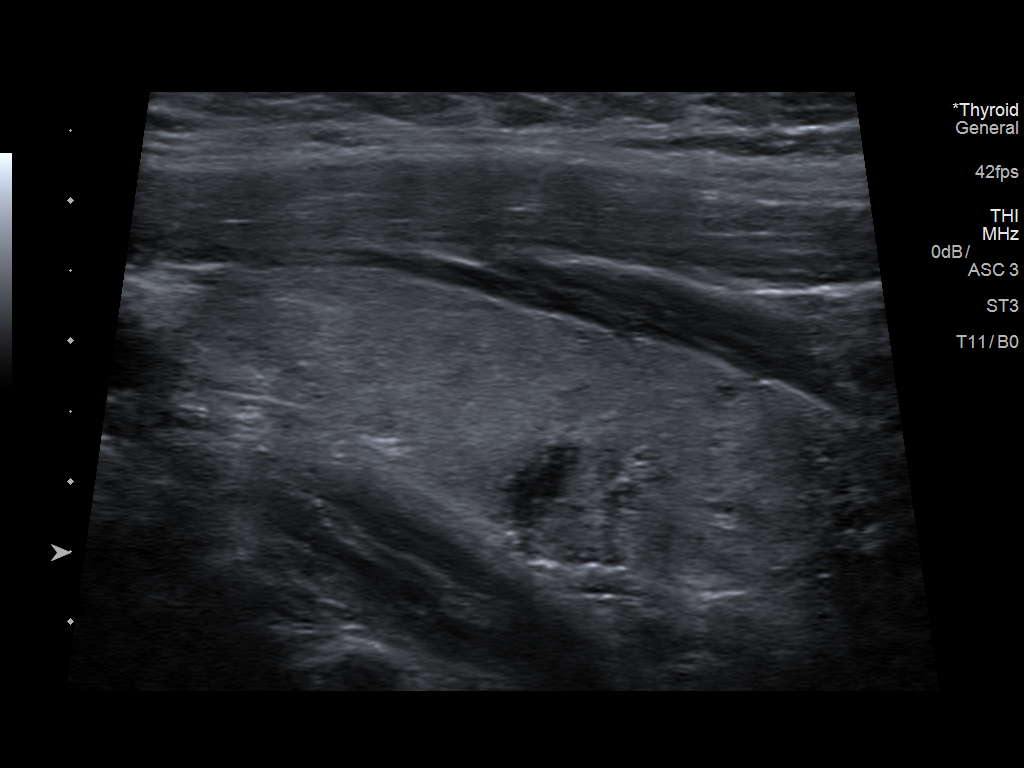
[im 3/5]
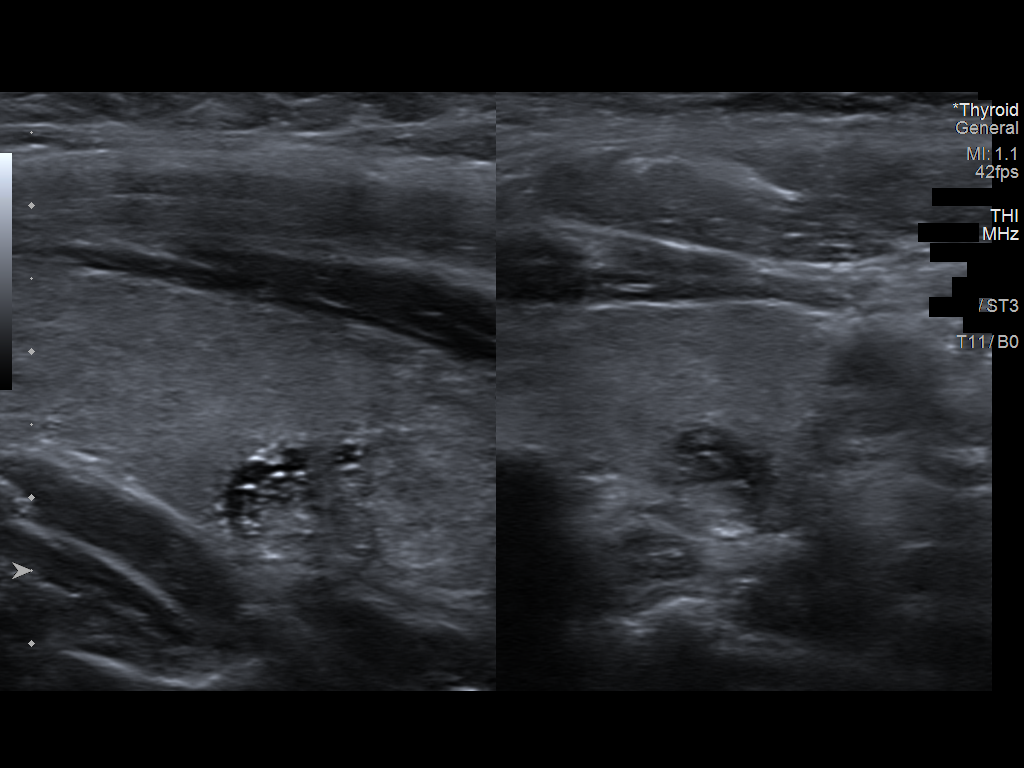
[im 4/5]
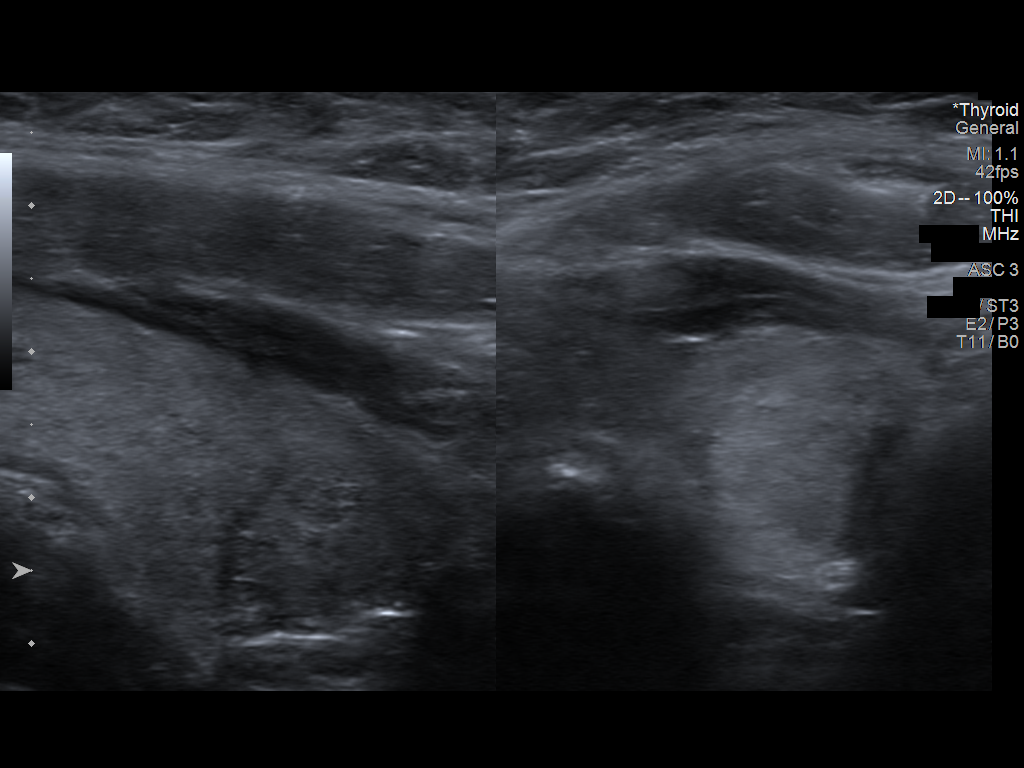
[im 5/5]
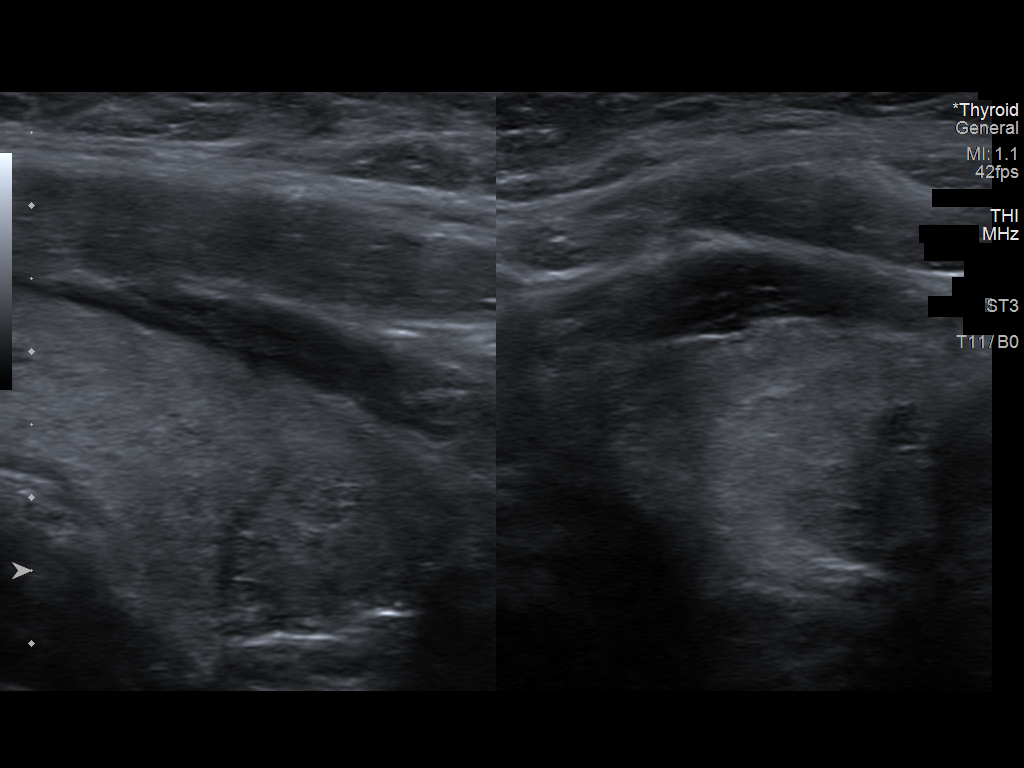

[5 of 5 positions shown; findings below may reference images not displayed]

FINDINGS: Parenchymal Echotexture: Normal

Isthmus: 0.4 cm

Right lobe: 5.7 cm x 1.6 cm x 1.6 cm

Left lobe: 5.6 cm x 2.0 cm x 1.9 cm

_________________________________________________________

Estimated total number of nodules >/= 1 cm: 1

Number of spongiform nodules >/=  2 cm not described below (TR1): 0

Number of mixed cystic and solid nodules >/= 1.5 cm not described
below (TR2): 0

_________________________________________________________

Nodule # 1:

Location: Left; Superior

Maximum size: 0.5 cm; Other 2 dimensions: 0.4 cm x 0.3 cm

Composition: cystic/almost completely cystic (0)

Echogenicity: anechoic (0)

Shape: not taller-than-wide (0)

Margins: smooth (0)

Echogenic foci: none (0)

ACR TI-RADS total points: 0.

ACR TI-RADS risk category: TR1 (0-1 points).

ACR TI-RADS recommendations:

Nodule does not meet criteria for surveillance or biopsy

_________________________________________________________

Nodule # 2:

Location: Left; Inferior

Maximum size: 0.8 cm; Other 2 dimensions: 0.8 cm x 0.6 cm

Composition: mixed cystic and solid (1)

Echogenicity: hypoechoic (2)

Shape: not taller-than-wide (0)

Margins: ill-defined (0)

Echogenic foci: none (0)

ACR TI-RADS total points: 3.

ACR TI-RADS risk category: TR3 (3 points).

ACR TI-RADS recommendations:

Nodule does not meet criteria for surveillance or biopsy

_________________________________________________________

Nodule # 3:

Location: Left; Mid

Maximum size: 1.5 cm; Other 2 dimensions: 1.3 cm x 1.3 cm

Composition: solid/almost completely solid (2)

Echogenicity: isoechoic (1)

Shape: not taller-than-wide (0)

Margins: ill-defined (0)

Echogenic foci: none (0)

ACR TI-RADS total points: 3.

ACR TI-RADS risk category: TR3 (3 points).

ACR TI-RADS recommendations:

Nodule meets criteria for surveillance

_________________________________________________________

No adenopathy
IMPRESSION: Left mid thyroid nodule (Labeled 3) meets criteria for surveillance,
as designated by the newly established ACR TI-RADS criteria.
Surveillance ultrasound study recommended to be performed annually
up to 5 years.

Remainder of nodules do not meet criteria for surveillance or
biopsy.

Recommendations follow those established by the new ACR TI-RADS
criteria ([HOSPITAL] 5462;[DATE]).

## 2017-12-14 ENCOUNTER — Encounter: Payer: Self-pay | Admitting: Internal Medicine

## 2017-12-14 ENCOUNTER — Encounter: Payer: Self-pay | Admitting: Podiatry

## 2017-12-27 DIAGNOSIS — H524 Presbyopia: Secondary | ICD-10-CM | POA: Diagnosis not present

## 2017-12-27 DIAGNOSIS — E119 Type 2 diabetes mellitus without complications: Secondary | ICD-10-CM | POA: Diagnosis not present

## 2017-12-27 DIAGNOSIS — H43811 Vitreous degeneration, right eye: Secondary | ICD-10-CM | POA: Diagnosis not present

## 2017-12-27 DIAGNOSIS — H2513 Age-related nuclear cataract, bilateral: Secondary | ICD-10-CM | POA: Diagnosis not present

## 2017-12-27 DIAGNOSIS — H25013 Cortical age-related cataract, bilateral: Secondary | ICD-10-CM | POA: Diagnosis not present

## 2017-12-27 LAB — HM DIABETES EYE EXAM

## 2017-12-27 MED FILL — UNIFINE PENTIPS 31GX3/16": 31G X 5 MM | 90 days supply | Qty: 100 | Fill #2

## 2017-12-27 MED FILL — UNIFINE PENTIPS 31GX3/16: 31G X 5 MM | 90 days supply | Qty: 100 | Fill #2

## 2017-12-28 ENCOUNTER — Encounter: Payer: Self-pay | Admitting: *Deleted

## 2018-01-03 ENCOUNTER — Encounter: Payer: Self-pay | Admitting: Internal Medicine

## 2018-01-09 MED FILL — ESCITALOPRAM 10 MG TABLET: 10 | 90 days supply | Qty: 180 | Fill #1

## 2018-01-09 MED FILL — BUPROPION HCL XL 150 MG TAB: 150 | 90 days supply | Qty: 90 | Fill #1

## 2018-01-09 MED FILL — MELOXICAM 15 MG TABLET: 15 | 60 days supply | Qty: 60 | Fill #1

## 2018-01-10 ENCOUNTER — Encounter: Payer: Self-pay | Admitting: Obstetrics & Gynecology

## 2018-01-10 ENCOUNTER — Ambulatory Visit (INDEPENDENT_AMBULATORY_CARE_PROVIDER_SITE_OTHER): Payer: 59 | Admitting: Obstetrics & Gynecology

## 2018-01-10 VITALS — BP 138/84 | Ht 65.5 in | Wt 318.0 lb

## 2018-01-10 DIAGNOSIS — Z90710 Acquired absence of both cervix and uterus: Secondary | ICD-10-CM

## 2018-01-10 DIAGNOSIS — Z01419 Encounter for gynecological examination (general) (routine) without abnormal findings: Secondary | ICD-10-CM | POA: Diagnosis not present

## 2018-01-10 DIAGNOSIS — Z6841 Body Mass Index (BMI) 40.0 and over, adult: Secondary | ICD-10-CM

## 2018-01-10 DIAGNOSIS — Z780 Asymptomatic menopausal state: Secondary | ICD-10-CM | POA: Diagnosis not present

## 2018-01-10 NOTE — Progress Notes (Signed)
Denise Stevens Aug 21, 1965 [id number]   History:    52 y.o. G2P2L2 Divorced.  Boyfriend.  RP:  Established patient presenting for annual gyn exam   HPI: S/P Total Hysterectomy.  Menopause, well without HRT.  Vasomotor menopausal symptoms controlled with increase in Lexapro by family physician.  No pelvic pain.  Normal vaginal secretions.  No pain with intercourse.  Urine and bowel movements normal.  Breasts normal.  Body mass index 52.11.  Health labs with family physician.  Past medical history,surgical history, family history and social history were all reviewed and documented in the EPIC chart.  Gynecologic History No LMP recorded. Patient has had a hysterectomy. Contraception: status post hysterectomy Last Pap: 2018. Results were: Negative, HPV HR neg Last mammogram: 02/2017. Results were: Negative Bone Density: Never Colonoscopy: 2016  Obstetric History OB History  Gravida Para Term Preterm AB Living  2 2       2   SAB TAB Ectopic Multiple Live Births               # Outcome Date GA Lbr Len/2nd Weight Sex Delivery Anes PTL Lv  2 Para           1 Para              ROS: A ROS was performed and pertinent positives and negatives are included in the history.  GENERAL: No fevers or chills. HEENT: No change in vision, no earache, sore throat or sinus congestion. NECK: No pain or stiffness. CARDIOVASCULAR: No chest pain or pressure. No palpitations. PULMONARY: No shortness of breath, cough or wheeze. GASTROINTESTINAL: No abdominal pain, nausea, vomiting or diarrhea, melena or bright red blood per rectum. GENITOURINARY: No urinary frequency, urgency, hesitancy or dysuria. MUSCULOSKELETAL: No joint or muscle pain, no back pain, no recent trauma. DERMATOLOGIC: No rash, no itching, no lesions. ENDOCRINE: No polyuria, polydipsia, no heat or cold intolerance. No recent change in weight. HEMATOLOGICAL: No anemia or easy bruising or bleeding. NEUROLOGIC: No headache, seizures, numbness,  tingling or weakness. PSYCHIATRIC: No depression, no loss of interest in normal activity or change in sleep pattern.     Exam:   BP 138/84   Ht 5' 5.5" (1.664 m)   Wt (!) 318 lb (144.2 kg)   BMI 52.11 kg/m   Body mass index is 52.11 kg/m.  General appearance : Well developed well nourished female. No acute distress HEENT: Eyes: no retinal hemorrhage or exudates,  Neck supple, trachea midline, no carotid bruits, no thyroidmegaly Lungs: Clear to auscultation, no rhonchi or wheezes, or rib retractions  Heart: Regular rate and rhythm, no murmurs or gallops Breast:Examined in sitting and supine position were symmetrical in appearance, no palpable masses or tenderness,  no skin retraction, no nipple inversion, no nipple discharge, no skin discoloration, no axillary or supraclavicular lymphadenopathy Abdomen: no palpable masses or tenderness, no rebound or guarding Extremities: no edema or skin discoloration or tenderness  Pelvic: Vulva: Normal             Vagina: No gross lesions or discharge  Cervix/Uterus absent  Adnexa  Without masses or tenderness  Anus: Normal   Assessment/Plan:  52 y.o. female for annual exam   1. Well female exam with routine gynecological exam Normal gynecologic exam status post total hysterectomy.  Last Pap test 2018 negative with negative high risk HPV.  Breast exam normal.  Last screening mammogram negative in July 2018.  Colonoscopy 2016.  Health labs with family physician.  2. Hx of  total hysterectomy  3. Menopause present Well on no HRT.  S/P total hysterectomy.  Vasomotor symptoms improved after increasing Lexapro.  Recommend vitamin D supplement, calcium rich nutrition and regular weightbearing physical activity.  4. Class 3 severe obesity due to excess calories without serious comorbidity with body mass index (BMI) of 50.0 to 59.9 in adult [facility]) Regular aerobic physical activity 5 times a week and weightlifting every 2 days recommended.  Nutrition  with decreased calories/decreased carbs such as [employer] recommended.  Princess Bruins MD, 10:29 AM 01/10/2018

## 2018-01-12 ENCOUNTER — Encounter: Payer: Self-pay | Admitting: Obstetrics & Gynecology

## 2018-01-12 ENCOUNTER — Ambulatory Visit (INDEPENDENT_AMBULATORY_CARE_PROVIDER_SITE_OTHER): Payer: 59 | Admitting: Internal Medicine

## 2018-01-12 ENCOUNTER — Other Ambulatory Visit: Payer: Self-pay

## 2018-01-12 ENCOUNTER — Encounter: Payer: Self-pay | Admitting: Internal Medicine

## 2018-01-12 VITALS — BP 134/72 | HR 88 | Temp 99.1°F | Ht 65.5 in | Wt 323.7 lb

## 2018-01-12 DIAGNOSIS — R6889 Other general symptoms and signs: Secondary | ICD-10-CM

## 2018-01-12 DIAGNOSIS — E785 Hyperlipidemia, unspecified: Secondary | ICD-10-CM | POA: Diagnosis not present

## 2018-01-12 DIAGNOSIS — E1169 Type 2 diabetes mellitus with other specified complication: Secondary | ICD-10-CM

## 2018-01-12 DIAGNOSIS — Z79899 Other long term (current) drug therapy: Secondary | ICD-10-CM | POA: Diagnosis not present

## 2018-01-12 DIAGNOSIS — J309 Allergic rhinitis, unspecified: Secondary | ICD-10-CM

## 2018-01-12 DIAGNOSIS — E559 Vitamin D deficiency, unspecified: Secondary | ICD-10-CM | POA: Diagnosis not present

## 2018-01-12 DIAGNOSIS — M2550 Pain in unspecified joint: Secondary | ICD-10-CM

## 2018-01-12 DIAGNOSIS — Z0000 Encounter for general adult medical examination without abnormal findings: Secondary | ICD-10-CM

## 2018-01-12 DIAGNOSIS — I10 Essential (primary) hypertension: Secondary | ICD-10-CM

## 2018-01-12 DIAGNOSIS — Z862 Personal history of diseases of the blood and blood-forming organs and certain disorders involving the immune mechanism: Secondary | ICD-10-CM

## 2018-01-12 DIAGNOSIS — M791 Myalgia, unspecified site: Secondary | ICD-10-CM

## 2018-01-12 DIAGNOSIS — L814 Other melanin hyperpigmentation: Secondary | ICD-10-CM

## 2018-01-12 DIAGNOSIS — J301 Allergic rhinitis due to pollen: Secondary | ICD-10-CM

## 2018-01-12 DIAGNOSIS — Z7984 Long term (current) use of oral hypoglycemic drugs: Secondary | ICD-10-CM

## 2018-01-12 LAB — GLUCOSE, CAPILLARY: Glucose-Capillary: 169 mg/dL — ABNORMAL HIGH (ref 65–99)

## 2018-01-12 NOTE — Patient Instructions (Signed)
1. I will bring you your test results

## 2018-01-12 NOTE — Patient Instructions (Signed)
1. Well female exam with routine gynecological exam Normal gynecologic exam status post total hysterectomy.  Last Pap test 2018 negative with negative high risk HPV.  Breast exam normal.  Last screening mammogram negative in July 2018.  Colonoscopy 2016.  Health labs with family physician.  2. Hx of total hysterectomy  3. Menopause present Well on no HRT.  S/P total hysterectomy.  Vasomotor symptoms improved after increasing Lexapro.  Recommend vitamin D supplement, calcium rich nutrition and regular weightbearing physical activity.  4. Class 3 severe obesity due to excess calories without serious comorbidity with body mass index (BMI) of 50.0 to 59.9 in adult [facility]) Regular aerobic physical activity 5 times a week and weightlifting every 2 days recommended.  Nutrition with decreased calories/decreased carbs such as [employer] recommended.  Thailyn, it was a pleasure seeing you today!

## 2018-01-13 LAB — LIPID PANEL
CHOL/HDL RATIO: 3.9 ratio (ref 0.0–4.4)
CHOLESTEROL TOTAL: 172 mg/dL (ref 100–199)
HDL: 44 mg/dL (ref >39)
LDL CALC: 105 mg/dL — AB (ref 0–99)
TRIGLYCERIDES: 115 mg/dL (ref 0–149)
VLDL CHOLESTEROL CAL: 23 mg/dL (ref 5–40)

## 2018-01-13 LAB — BMP8+ANION GAP
Anion Gap: 15 mmol/L (ref 10.0–18.0)
BUN / CREAT RATIO: 10 (ref 9–23)
BUN: 9 mg/dL (ref 6–24)
CALCIUM: 9.3 mg/dL (ref 8.7–10.2)
CHLORIDE: 100 mmol/L (ref 96–106)
CO2: 23 mmol/L (ref 20–29)
CREATININE: 0.94 mg/dL (ref 0.57–1.00)
GFR calc Af Amer: 80 mL/min/{1.73_m2} (ref >59)
GFR, EST NON AFRICAN AMERICAN: 69 mL/min/{1.73_m2} (ref >59)
GLUCOSE: 193 mg/dL — AB (ref 65–99)
POTASSIUM: 3.8 mmol/L (ref 3.5–5.2)
Sodium: 138 mmol/L (ref 134–144)

## 2018-01-13 LAB — TSH: TSH: 1.68 u[IU]/mL (ref 0.450–4.500)

## 2018-01-13 LAB — CBC
Hematocrit: 35.8 % (ref 34.0–46.6)
Hemoglobin: 11.8 g/dL (ref 11.1–15.9)
MCH: 27.8 pg (ref 26.6–33.0)
MCHC: 33 g/dL (ref 31.5–35.7)
MCV: 84 fL (ref 79–97)
PLATELETS: 298 10*3/uL (ref 150–450)
RBC: 4.24 x10E6/uL (ref 3.77–5.28)
RDW: 15.1 % (ref 12.3–15.4)
WBC: 9.2 10*3/uL (ref 3.4–10.8)

## 2018-01-13 LAB — VITAMIN D 25 HYDROXY (VIT D DEFICIENCY, FRACTURES): Vit D, 25-Hydroxy: 59.8 ng/mL (ref 30.0–100.0)

## 2018-01-13 NOTE — Assessment & Plan Note (Signed)
HOT FLASHES  Resolved on increased dose of lexapro   ACHY LEGS at night This problem is new. No RLS - moves legs bc they ache. Only one month. A loet of heel / ankle issues. Different shoes helping the foot / ankle / heel. Start with lab W/U and follow.    VIT D SUPPLEMENTATION Taking Vit D 10,000 IU for 2 months. Is fat soluble so check level today.    Requests  CBC - has h/o anemia TSH - does have arthralgias FLP - DM and on statin   SOLAR LENTIGO New problem. Sun exposed area. Der referral if bothersome

## 2018-01-13 NOTE — Assessment & Plan Note (Signed)
Chronic and stable. Uses Flonase yr round but no QD. No sxs today.  PLAN:  Cont current meds

## 2018-01-13 NOTE — Progress Notes (Signed)
   Subjective:    Patient ID: Denise Stevens, female    DOB: 11-03-1965, 52 y.o.   MRN: [id number]  HPI  JARITA RAVAL is here for DM F/U. Please see the A&P for the status of the pt's chronic medical problems.  ROS : per ROS section and in problem oriented charting. All other systems are negative.  PMHx, Soc hx, and / or Fam hx : No longer dating Mr D. Oldest son at camp as part of choir / singing.    Review of Systems  HENT:       Ears cont to itch  Cardiovascular: Negative for palpitations.  Gastrointestinal:       No heaertburn  Endocrine:       No more hot flashes  Musculoskeletal: Positive for arthralgias and myalgias.       Legs B ache at night for one month  Skin:       No dry skin + hyperpig over cheekbones B        Objective:   Physical Exam  Constitutional: She appears well-developed and well-nourished. No distress.  HENT:  Head: Normocephalic and atraumatic.  Right Ear: External ear normal.  Left Ear: External ear normal.  Nose: Nose normal.  Eyes: Conjunctivae and EOM are normal. Right eye exhibits no discharge. Left eye exhibits no discharge. No scleral icterus.  Neck: Normal range of motion. No thyromegaly present.  Cardiovascular: Normal rate, regular rhythm and normal heart sounds.  No murmur heard. Pulmonary/Chest: Effort normal and breath sounds normal. No respiratory distress.  Musculoskeletal: She exhibits edema. She exhibits no tenderness.  Trace edema R, none L  Lymphadenopathy:    She has no cervical adenopathy.  Skin: Skin is warm and dry. No rash noted. She is not diaphoretic. No erythema. No pallor.  Patches over cheekbones, well circumscribed but irreg, hyperpigmented   Psychiatric: She has a normal mood and affect. Her behavior is normal. Judgment and thought content normal.          Assessment & Plan:

## 2018-01-13 NOTE — Assessment & Plan Note (Signed)
This problem is chronic and improved. Most recent A1C 6.9, down from 7.7. On victoza and januvia 100 but not consistently taking victoza. Will work on taking [place] consistently. It hurts when injected into abd although rotating spot. Doesn't hurt if injects into arm.  PLAN:  Cont current meds A1C end of July

## 2018-01-13 NOTE — Assessment & Plan Note (Signed)
This problem is chronic and controlled. On Valsartan 160 and HCTZ 25. No SE. BP at goal.  PLAN:  Cont current meds   BP Readings from Last 3 Encounters:  01/12/18 134/72  01/10/18 138/84  07/21/17 121/69

## 2018-01-13 NOTE — Assessment & Plan Note (Signed)
This problem is chronic and controlled. On prava 40 a mod intensity statin for 2 prevention. No SE to the med.  PLAN:  Cont current meds

## 2018-01-19 NOTE — Addendum Note (Signed)
Addended by: Larey Dresser A on: 01/19/2018 02:22 PM   Modules accepted: Level of Service

## 2018-01-30 ENCOUNTER — Other Ambulatory Visit: Payer: Self-pay | Admitting: Internal Medicine

## 2018-01-30 MED FILL — JANUVIA 100 MG TABLET: 100 | 90 days supply | Qty: 90 | Fill #2

## 2018-01-30 MED FILL — PANTOPRAZOLE SOD DR 40 MG T: 40 | 90 days supply | Qty: 90 | Fill #2

## 2018-01-30 MED FILL — PRAVASTATIN NA 40 MG TAB: 40 | 90 days supply | Qty: 90 | Fill #0

## 2018-02-13 ENCOUNTER — Encounter: Payer: Self-pay | Admitting: Podiatry

## 2018-02-13 MED ORDER — MELOXICAM 15 MG PO TABS: 15.0000 mg | ORAL_TABLET | Freq: Every day | ORAL | 0 refills | Status: DC

## 2018-03-01 ENCOUNTER — Ambulatory Visit (INDEPENDENT_AMBULATORY_CARE_PROVIDER_SITE_OTHER): Payer: 59 | Admitting: *Deleted

## 2018-03-01 ENCOUNTER — Other Ambulatory Visit: Payer: Self-pay | Admitting: Internal Medicine

## 2018-03-01 ENCOUNTER — Ambulatory Visit: Payer: 59 | Admitting: Obstetrics & Gynecology

## 2018-03-01 DIAGNOSIS — R519 Headache, unspecified: Secondary | ICD-10-CM

## 2018-03-01 DIAGNOSIS — R51 Headache: Secondary | ICD-10-CM | POA: Diagnosis not present

## 2018-03-01 MED ORDER — KETOROLAC TROMETHAMINE 30 MG/ML IJ SOLN
30.0000 mg | Freq: Once | INTRAMUSCULAR | Status: AC
  Administered 2018-03-01: 30 mg via INTRAMUSCULAR

## 2018-03-07 ENCOUNTER — Other Ambulatory Visit: Payer: Self-pay | Admitting: Internal Medicine

## 2018-03-07 DIAGNOSIS — E1169 Type 2 diabetes mellitus with other specified complication: Secondary | ICD-10-CM

## 2018-03-08 ENCOUNTER — Other Ambulatory Visit: Payer: Self-pay | Admitting: Internal Medicine

## 2018-03-08 MED ORDER — SUMATRIPTAN SUCCINATE 50 MG PO TABS: 50.0000 mg | ORAL_TABLET | ORAL | 0 refills | Status: DC | PRN

## 2018-03-08 MED FILL — SUMAtriptan SUCCINATE 50 MG: 50 | 30 days supply | Qty: 10 | Fill #0

## 2018-03-16 MED FILL — VALSARTAN-HCTZ 160-25 MG TA: 160-25 | 90 days supply | Qty: 90 | Fill #2

## 2018-03-25 IMAGING — CR DG SHOULDER 2+V*L*
3 series · 3 of 3 positions shown · non-contrast
Comparison: No recent prior .

CLINICAL DATA: Left shoulder pain.  No known injury.

EXAM:
LEFT SHOULDER - 2+ VIEW

[shoulder grashey]
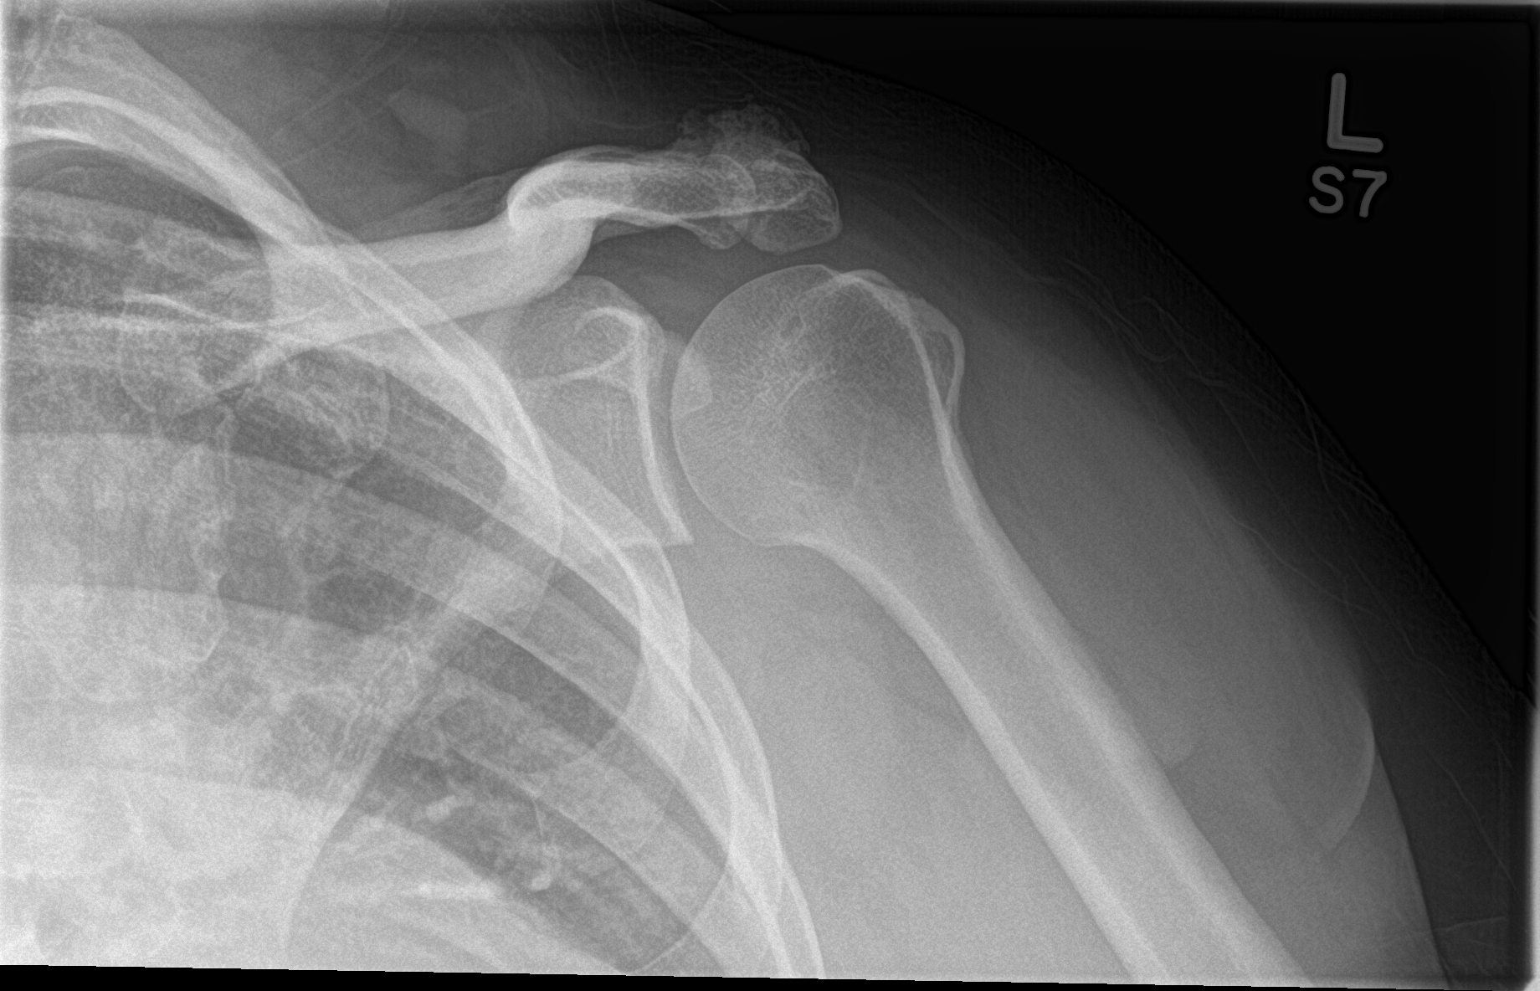

[shoulder y view]
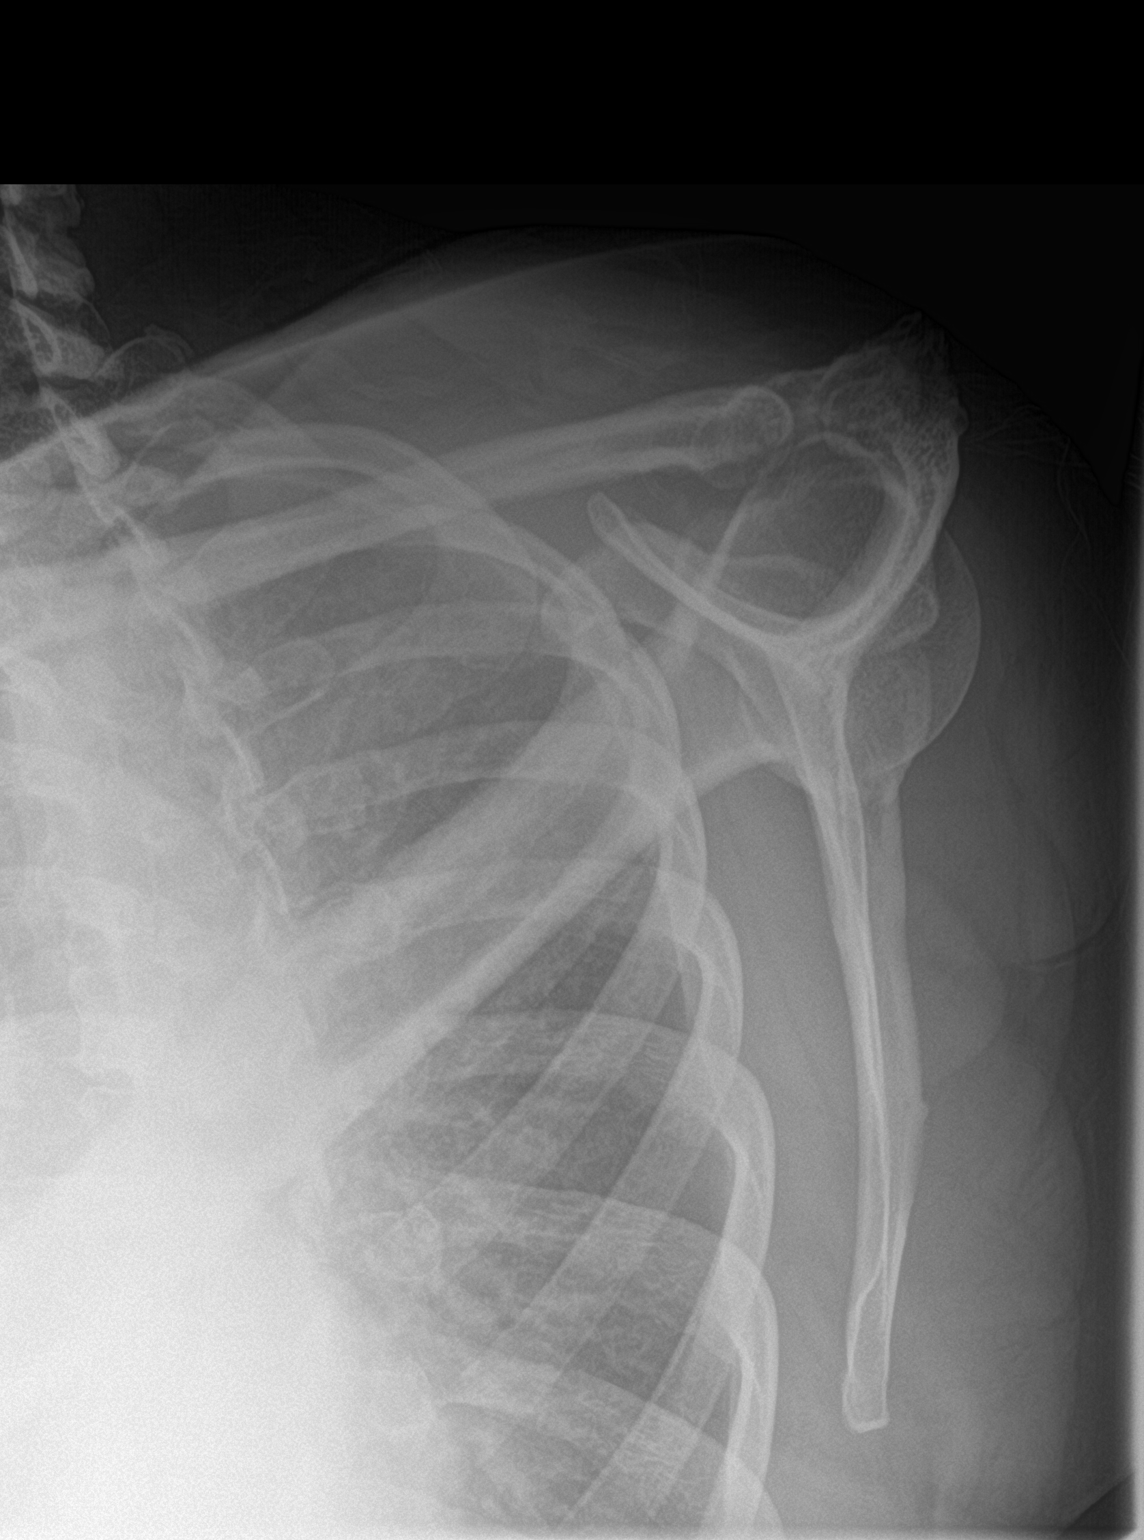

[shoulder axillary]
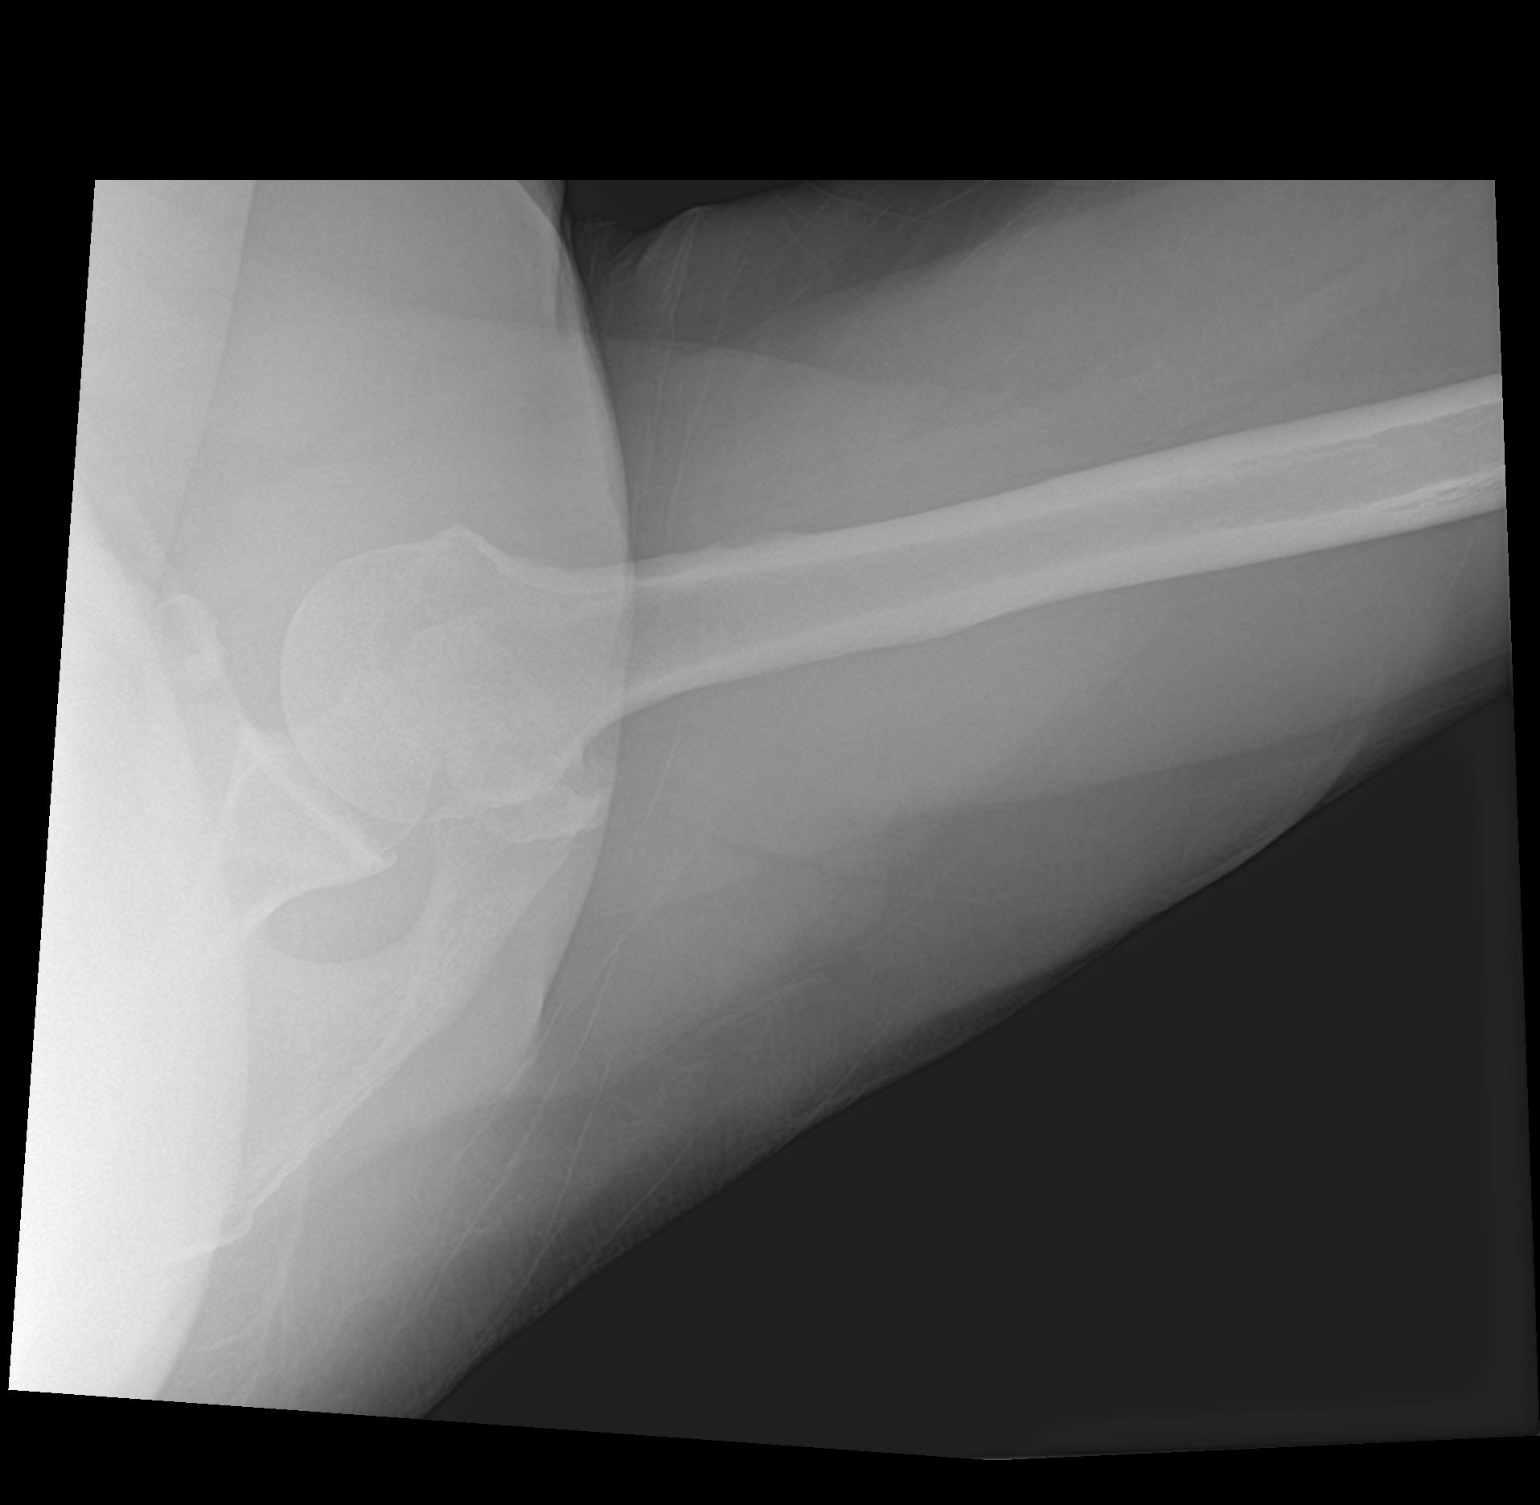

[3 of 3 positions shown; findings below may reference images not displayed]

FINDINGS: Prominent acromioclavicular degenerative change. No evidence of
fracture or dislocation. No acute bony abnormality.
IMPRESSION: Prominent acromioclavicular degenerative change. No acute bony
abnormality .

## 2018-03-25 IMAGING — CR DG CERVICAL SPINE 2 OR 3 VIEWS
3 series · 3 of 3 positions shown · non-contrast
Comparison: No recent prior.

CLINICAL DATA: Shoulder pain. Tingling and numbness distal left
hand. No known injury.

EXAM:
CERVICAL SPINE - 2-3 VIEW

[c-spine lat]
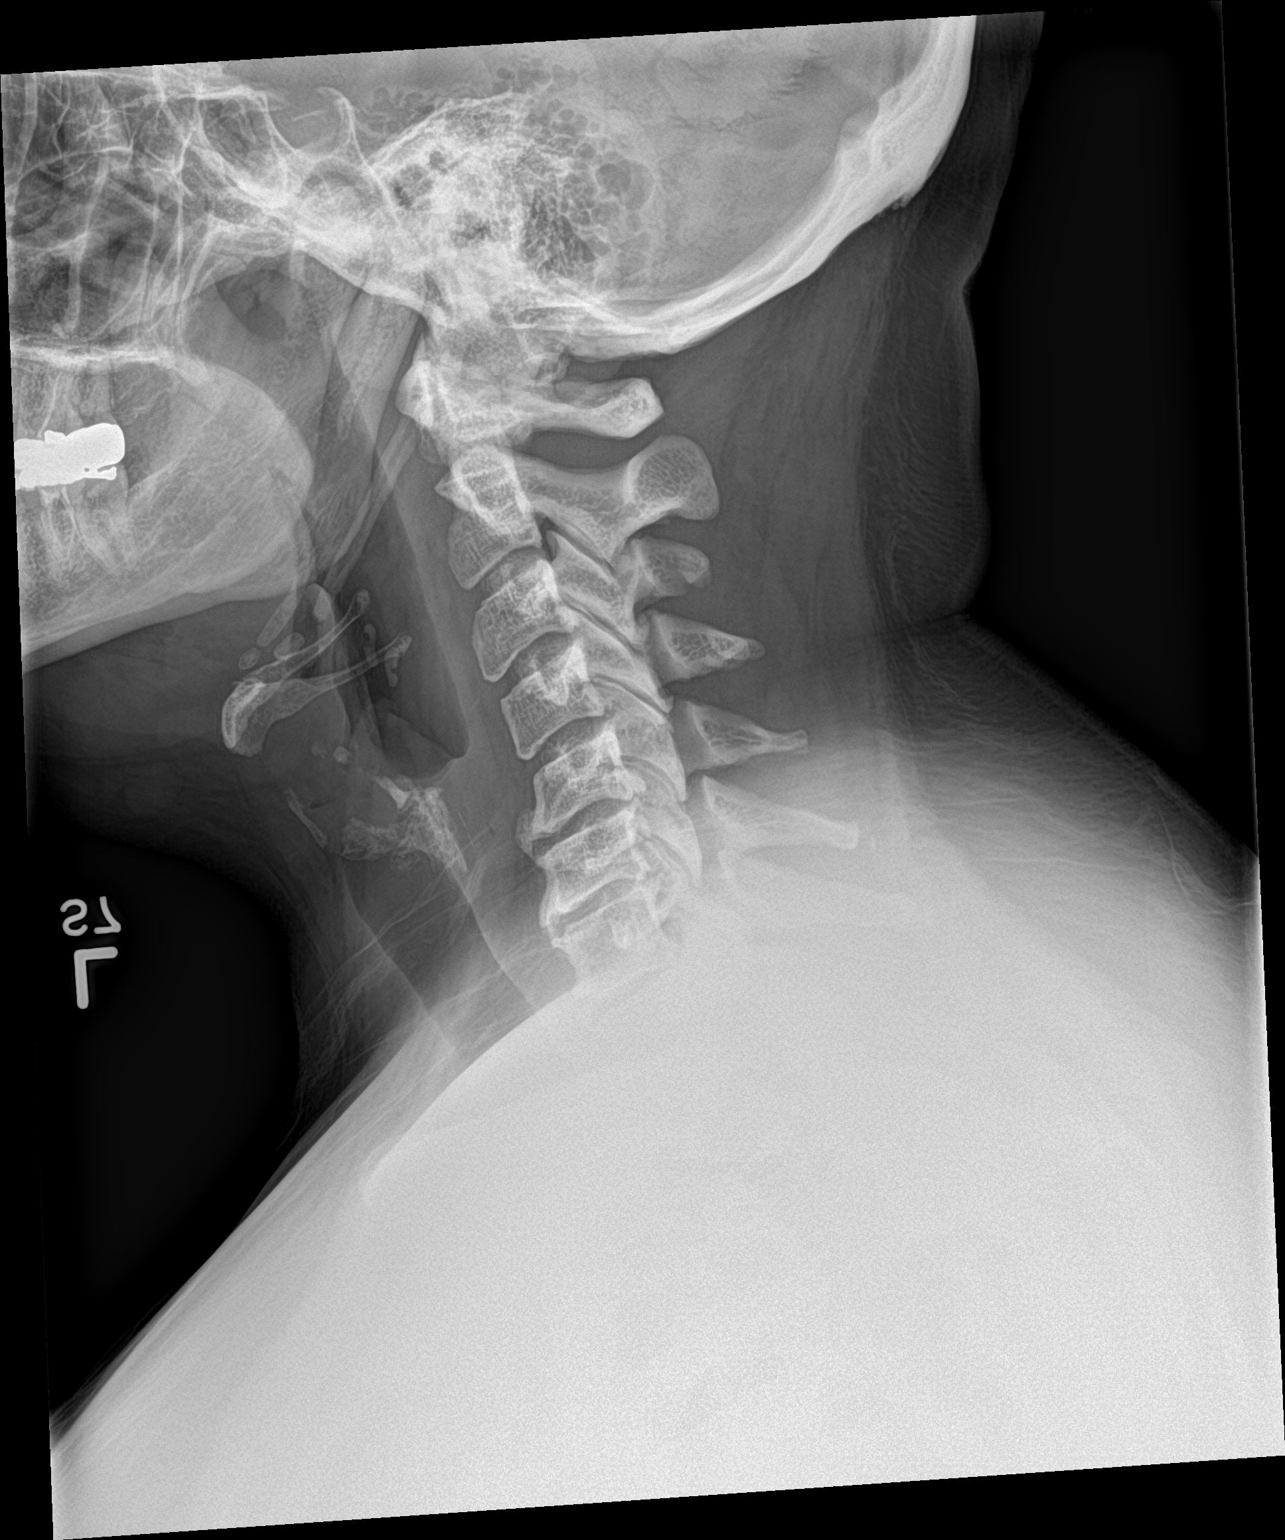

[c-spine ap]
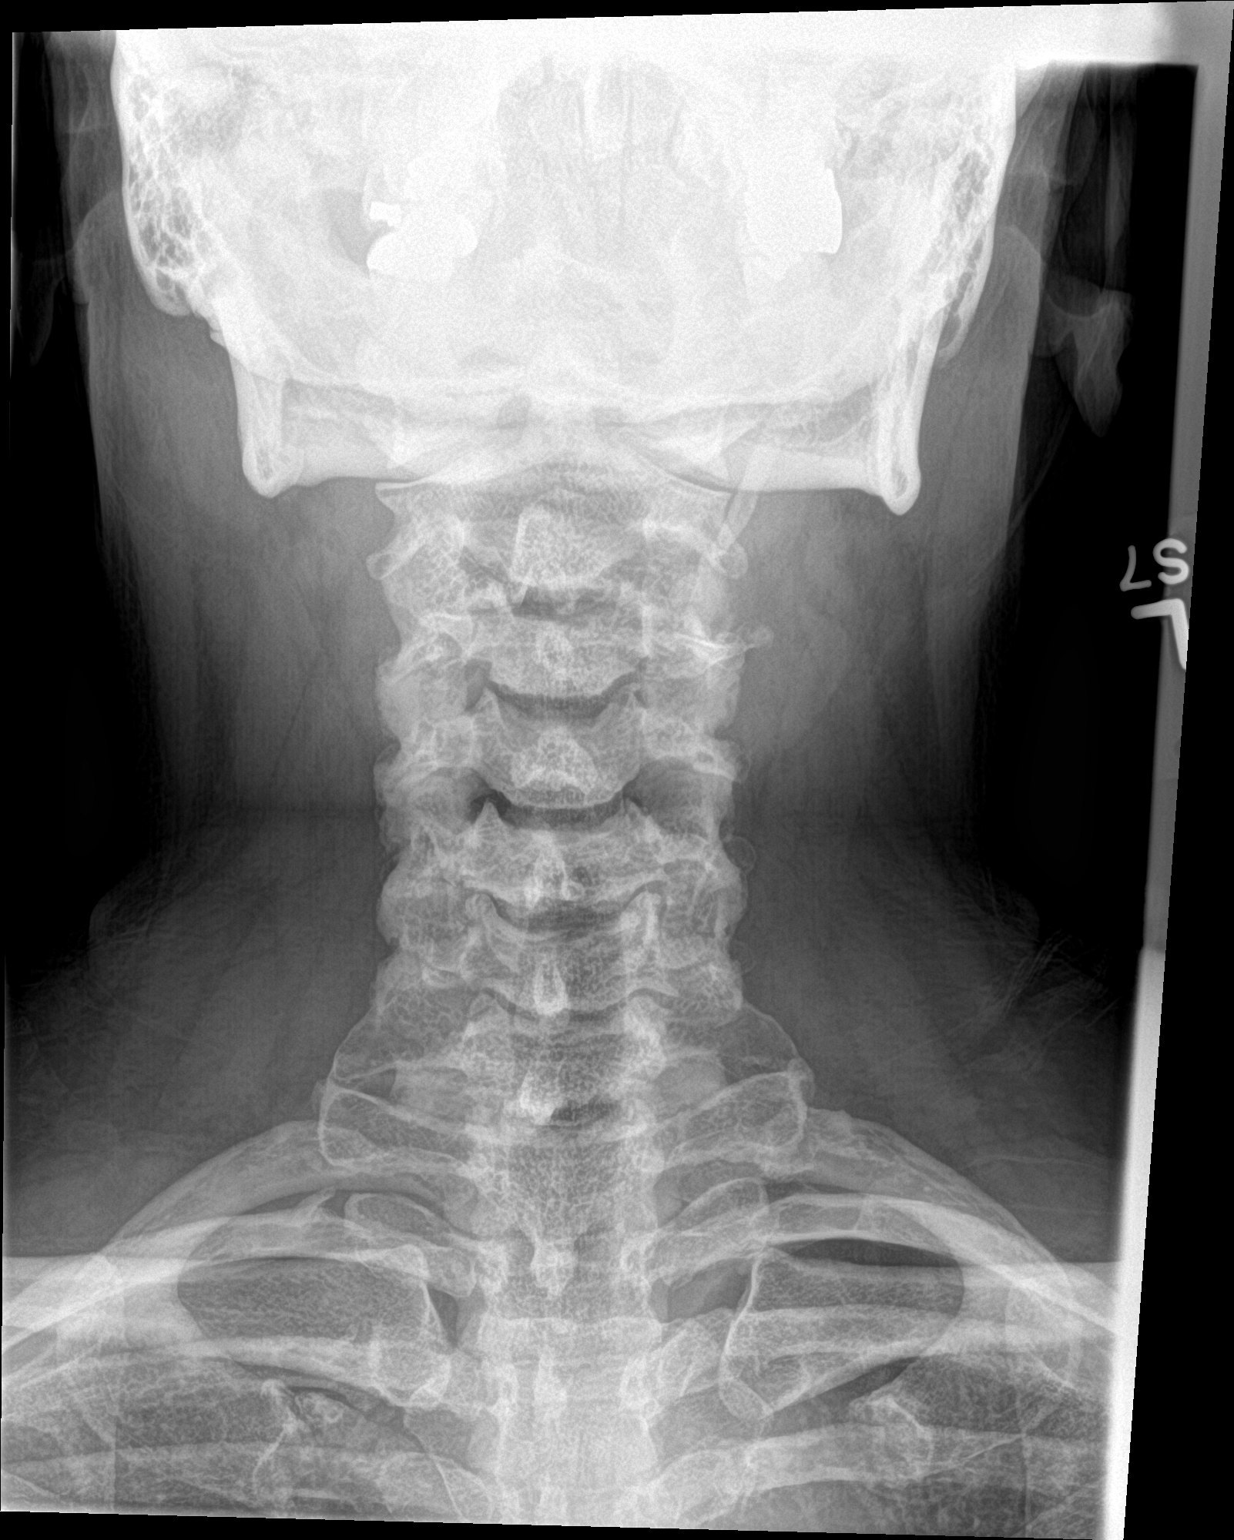

[c-spine swimmers]
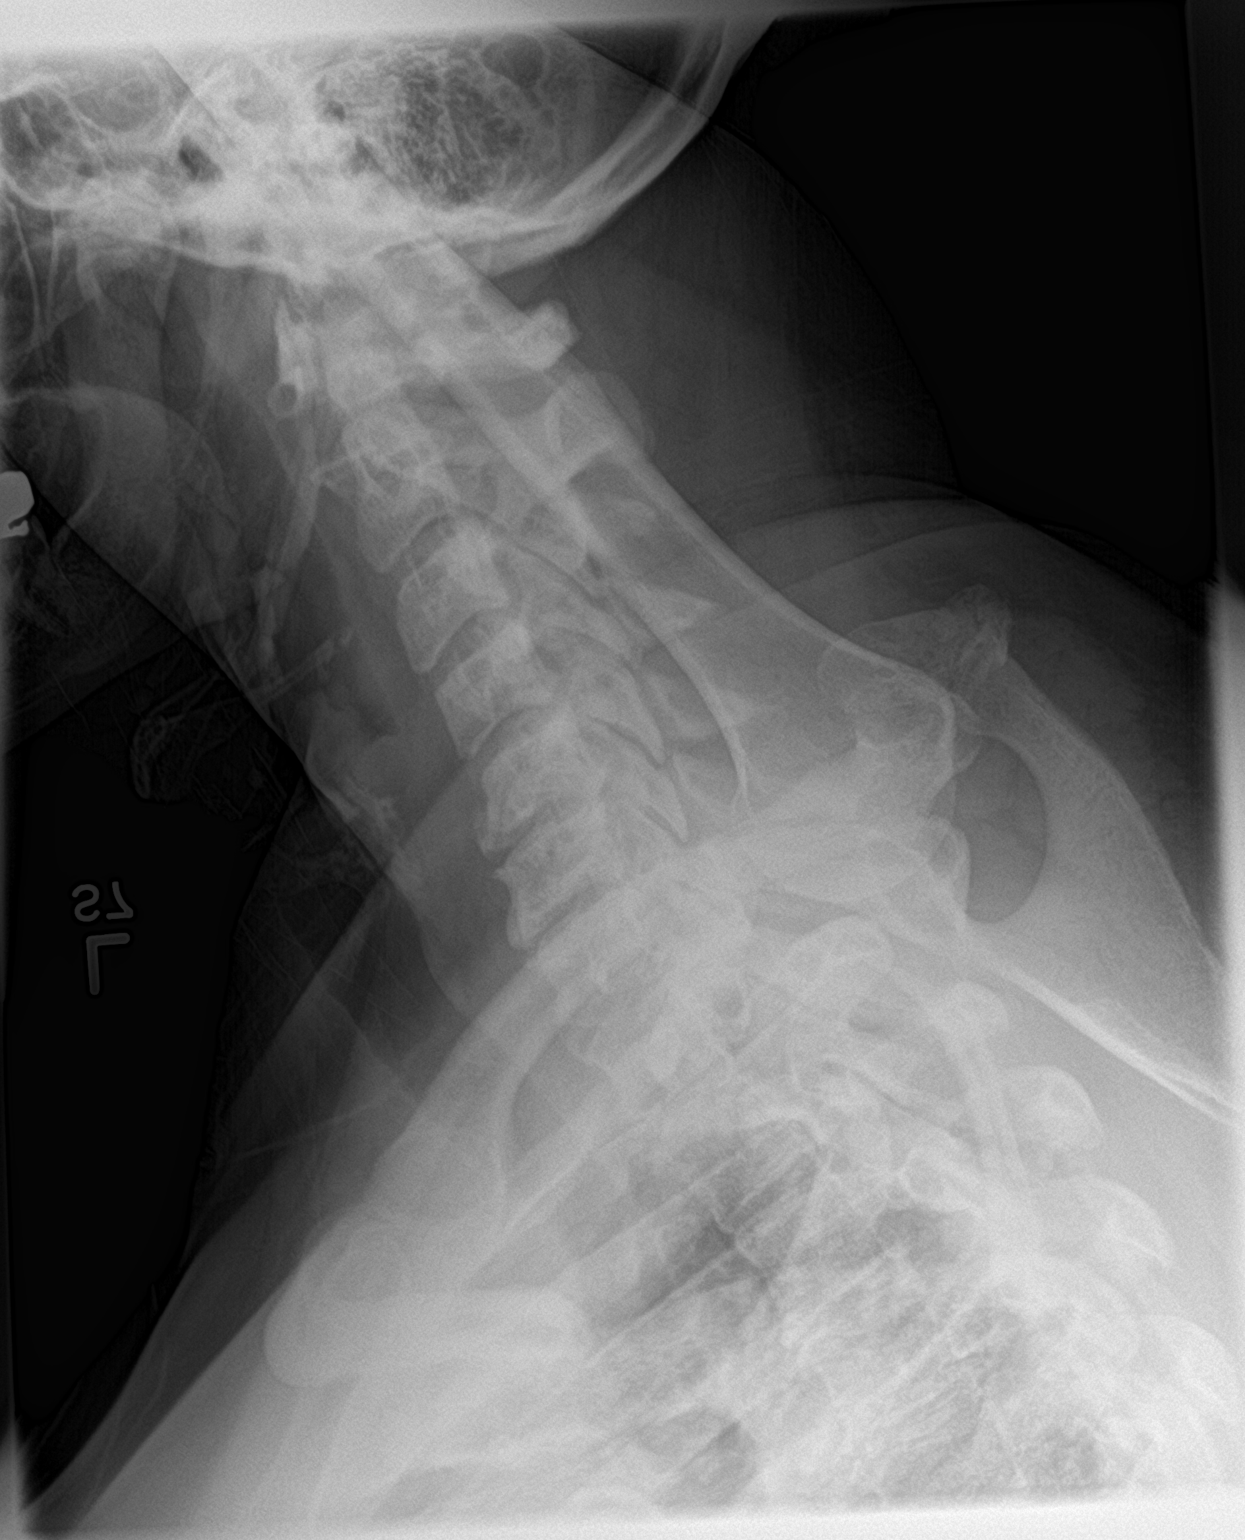

[3 of 3 positions shown; findings below may reference images not displayed]

FINDINGS: Diffuse degenerative change with prominent disc space loss and
endplate osteophyte formation C5-C6 and C6-C7. Loss of normal
cervical lordosis. No evidence of fracture or dislocation. Pulmonary
apices are clear.
IMPRESSION: Diffuse degenerative change with prominent disc space loss and
endplate osteophyte formation C5-C6 is C6-C7. Loss of normal
cervical lordosis. No acute bony abnormality.

## 2018-04-02 ENCOUNTER — Emergency Department (HOSPITAL_COMMUNITY)
Admission: EM | Admit: 2018-04-02 | Discharge: 2018-04-02 | Disposition: A | Discharge status: Home / Self Care | Payer: 59 | Attending: Emergency Medicine | Admitting: Emergency Medicine

## 2018-04-02 ENCOUNTER — Emergency Department (HOSPITAL_BASED_OUTPATIENT_CLINIC_OR_DEPARTMENT_OTHER): Payer: 59

## 2018-04-02 ENCOUNTER — Emergency Department (HOSPITAL_COMMUNITY): Payer: 59

## 2018-04-02 ENCOUNTER — Encounter (HOSPITAL_COMMUNITY): Payer: Self-pay

## 2018-04-02 DIAGNOSIS — J45909 Unspecified asthma, uncomplicated: Secondary | ICD-10-CM | POA: Insufficient documentation

## 2018-04-02 DIAGNOSIS — R0789 Other chest pain: Secondary | ICD-10-CM | POA: Diagnosis not present

## 2018-04-02 DIAGNOSIS — E119 Type 2 diabetes mellitus without complications: Secondary | ICD-10-CM | POA: Insufficient documentation

## 2018-04-02 DIAGNOSIS — Z79899 Other long term (current) drug therapy: Secondary | ICD-10-CM | POA: Insufficient documentation

## 2018-04-02 DIAGNOSIS — M79609 Pain in unspecified limb: Secondary | ICD-10-CM

## 2018-04-02 DIAGNOSIS — R609 Edema, unspecified: Secondary | ICD-10-CM

## 2018-04-02 DIAGNOSIS — I10 Essential (primary) hypertension: Secondary | ICD-10-CM | POA: Insufficient documentation

## 2018-04-02 DIAGNOSIS — R0781 Pleurodynia: Secondary | ICD-10-CM | POA: Diagnosis not present

## 2018-04-02 LAB — BASIC METABOLIC PANEL
Anion gap: 9 (ref 5–15)
BUN: 7 mg/dL (ref 6–20)
CALCIUM: 9.1 mg/dL (ref 8.9–10.3)
CO2: 28 mmol/L (ref 22–32)
CREATININE: 0.98 mg/dL (ref 0.44–1.00)
Chloride: 99 mmol/L (ref 98–111)
Glucose, Bld: 173 mg/dL — ABNORMAL HIGH (ref 70–99)
Potassium: 3.4 mmol/L — ABNORMAL LOW (ref 3.5–5.1)
SODIUM: 136 mmol/L (ref 135–145)

## 2018-04-02 LAB — CBC
HCT: 36.8 % (ref 36.0–46.0)
Hemoglobin: 11.8 g/dL — ABNORMAL LOW (ref 12.0–15.0)
MCH: 27.6 pg (ref 26.0–34.0)
MCHC: 32.1 g/dL (ref 30.0–36.0)
MCV: 86.2 fL (ref 78.0–100.0)
PLATELETS: 275 10*3/uL (ref 150–400)
RBC: 4.27 MIL/uL (ref 3.87–5.11)
RDW: 13.9 % (ref 11.5–15.5)
WBC: 10.9 10*3/uL — AB (ref 4.0–10.5)

## 2018-04-02 LAB — I-STAT TROPONIN, ED
TROPONIN I, POC: 0.02 ng/mL (ref 0.00–0.08)
TROPONIN I, POC: 0.01 ng/mL (ref 0.00–0.08)

## 2018-04-02 MED ORDER — IOPAMIDOL (ISOVUE-370) INJECTION 76%
100.0000 mL | Freq: Once | INTRAVENOUS | Status: AC | PRN
  Administered 2018-04-02: 100 mL via INTRAVENOUS

## 2018-04-02 MED ORDER — KETOROLAC TROMETHAMINE 15 MG/ML IJ SOLN
15.0000 mg | Freq: Once | INTRAMUSCULAR | Status: AC
  Administered 2018-04-02: 15 mg via INTRAVENOUS
  Filled 2018-04-02: qty 1

## 2018-04-02 MED ORDER — ACETAMINOPHEN 500 MG PO TABS
1000.0000 mg | ORAL_TABLET | Freq: Once | ORAL | Status: AC
  Administered 2018-04-02: 1000 mg via ORAL
  Filled 2018-04-02: qty 2

## 2018-04-02 MED ORDER — ASPIRIN 81 MG PO CHEW
324.0000 mg | CHEWABLE_TABLET | Freq: Once | ORAL | Status: AC
  Administered 2018-04-02: 324 mg via ORAL
  Filled 2018-04-02: qty 4

## 2018-04-02 MED ORDER — IOPAMIDOL (ISOVUE-370) INJECTION 76%
INTRAVENOUS | Status: AC
  Filled 2018-04-02: qty 100

## 2018-04-02 NOTE — ED Notes (Signed)
Patient verbalizes understanding of medications and discharge instructions. No further questions at this time. VSS and patient ambulatory at discharge.   

## 2018-04-02 NOTE — ED Notes (Signed)
EDP Couture at bedside

## 2018-04-02 NOTE — ED Notes (Signed)
Patient transported to CT 

## 2018-04-02 NOTE — Discharge Instructions (Addendum)
Please follow up with your primary care provider within 5-7 days for re-evaluation of your symptoms.    Please return to the emergency department for any new or worsening symptoms including persistent chest pain, chest pain associated with shortness of breath, nausea, sweating.

## 2018-04-02 NOTE — ED Provider Notes (Signed)
Medical screening examination/treatment/procedure(s) were conducted as a shared visit with non-physician practitioner(s) and myself.  I personally evaluated the patient during the encounter.  EKG Interpretation  Date/Time:  Sunday April 02 2018 16:28:25 EDT Ventricular Rate:  106 PR Interval:  140 QRS Duration: 66 QT Interval:  324 QTC Calculation: 430 R Axis:   71 Text Interpretation:  Sinus tachycardia Nonspecific T wave abnormality Abnormal ECG Confirmed by Lacretia Leigh ([id number]) on 04/02/2018 5:45:3 PM 52 year old female presents with left-sided chest pain that began yesterday characterizes sharp and worse with movements.  No associated cardiac findings.  Chest CT negative for PE here.  Suspect some component of pleurisy.  Will treat with anti-inflammatories and return precautions given.   Lacretia Leigh, MD 04/02/18 2044

## 2018-04-02 NOTE — Progress Notes (Signed)
VASCULAR LAB PRELIMINARY  PRELIMINARY  PRELIMINARY  PRELIMINARY  Left lower extremity venous duplex completed.    Preliminary report:  There is no DVT or SVT noted in the left lower extremity.  Gave report to Konrad Felix, [place], RVT 04/02/2018, 6:32 PM

## 2018-04-02 NOTE — ED Provider Notes (Signed)
[facility] Provider Note   CSN: [id number] Arrival date & time: 04/02/18  1612     History   Chief Complaint Chief Complaint  Patient presents with  . Chest Pain    HPI Denise Stevens is a 52 y.o. female.  HPI   Pt is a 52 y/o female with a h/o asthma, depression, T2DM, GERD, HTN, HLD who presents to the ED today c/o left side rib pain beneath the rib cage that began 2 days ago. Sxs are intermittent. Not associated with exertion. Pain rated at 6/10. States pain feels like "a knot". States pain radiates to the left back and to the left breast. She is also c/o left arm pain. Denies SOB, but states that she has pain with inspiration. Denies cough, fevers, chills.   Has tried medications for GERD without relief.   States she has had bilat ankle swelling but no unilateral leg swelling. No calf pain. Denies hemoptysis, recent surgery/trauma, recent long travel, hormone use, personal hx of cancer, or hx of DVT/PE.   Denies tobacco use.   Past Medical History:  Diagnosis Date  . Asthma    no intubation or hospitalization hx  . Depression   . Diabetes mellitus   . GERD (gastroesophageal reflux disease)   . HTN (hypertension)   . Hyperlipidemia     Patient Active Problem List   Diagnosis Date Noted  . Acute intractable headache 03/01/2018  . Osteoarthritis of left knee 07/05/2017  . Thyroid nodule 06/26/2016  . Cervical disc disorder with radiculopathy of cervical region 04/01/2016  . Greater trochanteric bursitis of both hips 04/01/2016  . Tendinopathy of rotator cuff 08/27/2015  . Epicondylitis, medial humeral 08/27/2015  . Hyperlipidemia 06/05/2015  . GERD (gastroesophageal reflux disease) 06/05/2015  . Allergic rhinitis 06/05/2015  . Keratoconjunctivitis sicca ([place]) 06/05/2015  . Health care maintenance 06/05/2015  . Morbid obesity ([place]) 03/31/2015  . Carpal tunnel syndrome, left 12/15/2014  . Essential hypertension 08/06/2014    . Dysphoric mood 08/06/2014  . Obstructive sleep apnea 01/21/2014  . Type 2 diabetes mellitus with other specified complication ([place]) 56/31/4970  . TROCHANTERIC BURSITIS, LEFT 01/17/2009  . UNEQUAL LEG LENGTH 01/17/2009    Past Surgical History:  Procedure Laterality Date  . ABDOMINAL HYSTERECTOMY    . COLONOSCOPY WITH PROPOFOL N/A 04/29/2015   Procedure: COLONOSCOPY WITH PROPOFOL;  Surgeon: Juanita Craver, MD;  Location: [facility];  Service: Endoscopy;  Laterality: N/A;  . GANGLION CYST EXCISION     R wrist 2000  . MARSUPIALIZATION URETHRAL DIVERTICULUM     2012  . ROTATOR CUFF REPAIR     R 2000  . VESICOVAGINAL FISTULA CLOSURE W/ TAH     nov 07     OB History    Gravida  2   Para  2   Term      Preterm      AB      Living  2     SAB      TAB      Ectopic      Multiple      Live Births               Home Medications    Prior to Admission medications   Medication Sig Start Date End Date Taking? Authorizing Provider  acetaminophen (TYLENOL) 500 MG tablet Take 1,000 mg by mouth every 6 (six) hours as needed for headache (pain).   Yes [provider]  acetic acid-hydrocortisone (  VOSOL-HC) OTIC solution Place 4 drops into both ears 3 (three) times daily. Patient taking differently: Place 4 drops into both ears 3 (three) times daily as needed (dermatitis).  09/20/17  Yes Bartholomew Crews, MD  buPROPion (WELLBUTRIN XL) 150 MG 24 hr tablet TAKE 1 TABLET BY MOUTH ONCE DAILY Patient taking differently: Take 150 mg by mouth daily.  09/02/17  Yes Bartholomew Crews, MD  cycloSPORINE (RESTASIS) 0.05 % ophthalmic emulsion Place 1 drop into both eyes at bedtime as needed (dry eyes).   Yes [provider]  escitalopram (LEXAPRO) 10 MG tablet Take 2 tablets (20 mg total) by mouth daily. 07/26/17  Yes Bartholomew Crews, MD  fluticasone (FLONASE) 50 MCG/ACT nasal spray Place 2 sprays into both nostrils daily. Patient taking differently: Place 1  spray into both nostrils 2 (two) times daily.  04/21/17  Yes Bartholomew Crews, MD  liraglutide (VICTOZA) 18 MG/3ML SOPN Inject 0.2 mLs (1.2 mg total) into the skin daily. Patient taking differently: Inject 1.2 mg into the skin daily before breakfast.  07/21/17  Yes Bartholomew Crews, MD  meloxicam (MOBIC) 15 MG tablet Take 1 tablet (15 mg total) by mouth daily. 02/13/18  Yes Edrick Kins, DPM  pantoprazole (PROTONIX) 40 MG tablet TAKE 1 TABLET BY MOUTH DAILY. Patient taking differently: Take 40 mg by mouth daily before breakfast.  05/18/17  Yes Bartholomew Crews, MD  pravastatin (PRAVACHOL) 40 MG tablet TAKE 1 TABLET (40 MG TOTAL) BY MOUTH DAILY. 01/30/18  Yes Bartholomew Crews, MD  sitaGLIPtin (JANUVIA) 100 MG tablet Take 1 tablet (100 mg total) by mouth daily. 07/21/17  Yes Bartholomew Crews, MD  Sodium Bicarbonate POWD Take 1.25 mLs by mouth See admin instructions. Mix 1/4 tsp (1.25 ml) in water and drink twice daily as needed for indigestion   Yes [provider]  SUMAtriptan (IMITREX) 50 MG tablet Take 1 tablet (50 mg total) by mouth every 2 (two) hours as needed for migraine. May repeat in 2 hours if headache persists or recurs. Patient taking differently: Take 50 mg by mouth See admin instructions. Take one tablet (50 mg) by mouth at onset of migraine, may repeat in 2 hours if headache persists or recurs. 03/08/18  Yes Bartholomew Crews, MD  valsartan-hydrochlorothiazide (DIOVAN-HCT) 160-25 MG tablet TAKE 1 TABLET BY MOUTH DAILY. 06/09/17  Yes Bartholomew Crews, MD  ALPRAZolam Duanne Moron) 0.25 MG tablet Take 1 tablet (0.25 mg total) by mouth 3 (three) times daily as needed for anxiety. Patient not taking: Reported on 01/12/2018 06/15/16   Sid Falcon, MD  glipiZIDE (GLUCOTROL) 5 MG tablet Take 1 tablet (5 mg total) by mouth daily before breakfast. Patient not taking: Reported on 04/02/2018 04/23/16 04/23/17  Bartholomew Crews, MD  glucose blood (TRUE METRIX BLOOD  GLUCOSE TEST) test strip Use to check sugar three times a day 10/25/16   Bartholomew Crews, MD  Insulin Pen Needle 32G X 6 MM MISC Use to inject victoza daily 01/06/17   Bartholomew Crews, MD    Family History Family History  Problem Relation Age of Onset  . Cancer Father        prostate  . Hyperlipidemia Mother   . Hypertension Mother   . Kidney disease Mother   . Diabetes Mother   . Cancer Mother        breast  . Obesity Sister        s/p bypass  . Heart attack Neg Hx  Social History Social History   Tobacco Use  . Smoking status: Never Smoker  . Smokeless tobacco: Never Used  Substance Use Topics  . Alcohol use: Not Currently    Alcohol/week: 0.0 standard drinks  . Drug use: No     Allergies   Augmentin [amoxicillin-pot clavulanate]; Codeine; Morphine and related; and Tramadol   Review of Systems Review of Systems  Constitutional: Negative for chills and fever.  HENT: Negative for congestion.   Eyes: Negative for visual disturbance.  Respiratory: Negative for cough and shortness of breath.   Cardiovascular: Positive for chest pain and leg swelling (resolved). Negative for palpitations.  Gastrointestinal: Negative for abdominal pain, constipation, diarrhea, nausea and vomiting.  Genitourinary: Negative for dysuria.  Musculoskeletal: Positive for back pain. Negative for neck pain.  Skin: Negative for color change.  Neurological: Negative for dizziness, light-headedness and headaches.    Physical Exam Updated Vital Signs BP 129/77   Pulse 82   Temp 98.5 F (36.9 C) (Oral)   Resp 20   SpO2 95%   Physical Exam  Constitutional: She appears well-developed and well-nourished. No distress.  Appears uncomfortable  HENT:  Head: Normocephalic and atraumatic.  Eyes: Conjunctivae are normal.  Neck: Neck supple.  Cardiovascular: Normal rate, regular rhythm and intact distal pulses.  No murmur heard. Pulmonary/Chest: Effort normal and breath sounds  normal. No respiratory distress. She has no decreased breath sounds. She has no wheezes. She has no rhonchi. She has no rales.  TTP to the left lower ribs that does not reproduce pain  Abdominal: Soft. Bowel sounds are normal. She exhibits no distension and no mass. There is no tenderness. There is no guarding.  Musculoskeletal: She exhibits no edema.       Right lower leg: Normal. She exhibits no tenderness and no edema.       Left lower leg: She exhibits tenderness. She exhibits no edema.  TTP to the left mid thoracic paraspinous muscles and alone the left posterior ribs  Neurological: She is alert.  Skin: Skin is warm and dry. Capillary refill takes less than 2 seconds.  No rashes noted   Psychiatric: She has a normal mood and affect.  Nursing note and vitals reviewed.   ED Treatments / Results  Labs (all labs ordered are listed, but only abnormal results are displayed) Labs Reviewed  BASIC METABOLIC PANEL - Abnormal; Notable for the following components:      Result Value   Potassium 3.4 (*)    Glucose, Bld 173 (*)    All other components within normal limits  CBC - Abnormal; Notable for the following components:   WBC 10.9 (*)    Hemoglobin 11.8 (*)    All other components within normal limits  I-STAT TROPONIN, ED  I-STAT TROPONIN, ED    EKG EKG Interpretation  Date/Time:  Sunday April 02 2018 16:28:25 EDT Ventricular Rate:  106 PR Interval:  140 QRS Duration: 66 QT Interval:  324 QTC Calculation: 430 R Axis:   71 Text Interpretation:  Sinus tachycardia Nonspecific T wave abnormality Abnormal ECG Confirmed by Lacretia Leigh ([id number]) on 04/02/2018 5:11:44 PM   Radiology Dg Chest 2 View  Result Date: 04/02/2018 CLINICAL DATA:  LEFT rib cage pain onset 2 days ago, radiating to back and LEFT upper arm, increased pain with deep breathing. EXAM: CHEST - 2 VIEW COMPARISON:  Chest x-ray dated 10/28/2016. FINDINGS: Study is slightly hypoinspiratory with crowding of the  perihilar and bibasilar bronchovascular markings. Given the low lung volumes,  lungs appear clear. No confluent opacity to suggest a developing pneumonia. No pleural effusion or pneumothorax seen. Heart size and mediastinal contours appear grossly stable. Elevation of the RIGHT hemidiaphragm is stable. No acute or suspicious osseous finding. IMPRESSION: No active cardiopulmonary disease. No evidence of pneumonia or pulmonary edema. Electronically Signed   By: Franki Cabot M.D.   On: 04/02/2018 17:35   Ct Angio Chest Pe W And/or Wo Contrast  Result Date: 04/02/2018 CLINICAL DATA:  Left rib cage pain. EXAM: CT ANGIOGRAPHY CHEST WITH CONTRAST TECHNIQUE: Multidetector CT imaging of the chest was performed using the standard protocol during bolus administration of intravenous contrast. Multiplanar CT image reconstructions and MIPs were obtained to evaluate the vascular anatomy. CONTRAST:  49 mL ISOVUE-370 IOPAMIDOL (ISOVUE-370) INJECTION 76% COMPARISON:  None. FINDINGS: Cardiovascular: Heart is borderline enlarged. Aorta is normal caliber. No filling defects in the pulmonary arteries to suggest pulmonary emboli. Mediastinum/Nodes: Chest wall soft tissues are unremarkable. Lungs/Pleura: Lungs are clear. No focal airspace opacities or suspicious nodules. No effusions. Upper Abdomen: Imaging into the upper abdomen shows no acute findings. Musculoskeletal: Chest wall soft tissues are unremarkable. No acute bony abnormality. Review of the MIP images confirms the above findings. IMPRESSION: No evidence of pulmonary embolus. Borderline heart size. No acute cardiopulmonary disease. Electronically Signed   By: Rolm Baptise M.D.   On: 04/02/2018 19:07    Procedures Procedures (including critical care time)  Medications Ordered in ED Medications  acetaminophen (TYLENOL) tablet 1,000 mg (1,000 mg Oral Given 04/02/18 1742)  iopamidol (ISOVUE-370) 76 % injection 100 mL (100 mLs Intravenous Contrast Given 04/02/18 1847)    aspirin chewable tablet 324 mg (324 mg Oral Given 04/02/18 1934)  ketorolac (TORADOL) 15 MG/ML injection 15 mg (15 mg Intravenous Given 04/02/18 2058)     Initial Impression / Assessment and Plan / ED Course  I have reviewed the triage vital signs and the nursing notes.  Pertinent labs & imaging results that were available during my care of the patient were reviewed by me and considered in my medical decision making (see chart for details).    Discussed pt presentation and exam findings with Dr. Zenia Resides, who evaluated the pt and recommends administering toradol and discharging the patient.   Final Clinical Impressions(s) / ED Diagnoses   Final diagnoses:  Atypical chest pain   Patient is a 52 year old female presented to ED today complaining of left lower rib pain that radiates to the left mid back which began 2 days ago.  Symptoms are intermittent not associated with exertion, shortness of breath, diaphoresis, nausea.  Patient does have pain with inspiration.  Also with left calf tenderness.  Is initially tachycardic and hypertensive though satting well on room air.  Afebrile.  Given her tachycardia and reports of pain with inspiration and left calf pain, there was concern for PE/DVT left lower extremity ultrasound and CTA of the chest were ordered.  Left lower extremity ultrasound negative for DVT. Chest x-ray without evidence of acute cardia pulmonary disease.  No pneumonia or pulmonary edema.  No widened mediastinum. CT of the chest negative for acute pulmonary embolus, borderline cardiomegaly, but no active disease noted.  EKG with sinus tachycardia and nonspecific twave abnormality.  Lab work is grossly reassuring.  She has a mild elevated white blood cell count at 10.9.  Mild anemia that is stable.  BMP with mild hypokalemia and elevated blood glucose.  No evidence of DKA.  Troponins are negative.  Symptoms sound atypical for ACS.  Patient's  heart score is 4.  Her symptoms seem more  consistent with musculoskeletal cause versus pleurisy.  Advised anti-inflammatories and close follow-up with her PCP.  Gave strict return precautions for any worsening symptoms in the meantime.  Patient was in understanding of the plan and reasons to return immediately to the ED.  All questions answered.  ED Discharge Orders    None       Bishop Dublin 04/02/18 2258    Lacretia Leigh, MD 04/04/18 1527

## 2018-04-02 NOTE — ED Triage Notes (Signed)
Onset 2 days ago left rib cage pain that radiates around to back and leg upper arm pain that feels like a cramp.   Pain increases with deep breathing.  Pt has been taking meds for GERD, back soda with water, ginger ale with no relief.  No known cause. Has not taken BP med today.

## 2018-04-03 ENCOUNTER — Other Ambulatory Visit: Payer: Self-pay | Admitting: Internal Medicine

## 2018-04-03 MED ORDER — DICYCLOMINE HCL 20 MG PO TABS: 20.0000 mg | ORAL_TABLET | Freq: Three times a day (TID) | ORAL | 0 refills | Status: DC | PRN

## 2018-04-03 MED ORDER — HYDROCODONE-ACETAMINOPHEN 5-325 MG PO TABS: 1.0000 | ORAL_TABLET | ORAL | 0 refills | Status: DC | PRN

## 2018-04-03 MED ORDER — PREDNISONE 20 MG PO TABS: 40.0000 mg | ORAL_TABLET | Freq: Every day | ORAL | 0 refills | Status: DC

## 2018-04-03 MED FILL — DICYCLOMINE 20 MG TABLET: 20 | 10 days supply | Qty: 30 | Fill #0

## 2018-04-03 MED FILL — predniSONE 20 MG TABS: 20 | 5 days supply | Qty: 10 | Fill #0

## 2018-04-03 MED FILL — HYDROCODON-APAP 5-325: 5-325 | 3 days supply | Qty: 20 | Fill #0

## 2018-04-12 ENCOUNTER — Other Ambulatory Visit: Payer: Self-pay | Admitting: Internal Medicine

## 2018-04-12 MED ORDER — HYDROCOD POLST-CPM POLST ER 10-8 MG/5ML PO SUER: 5.0000 mL | Freq: Two times a day (BID) | ORAL | 0 refills | Status: DC | PRN

## 2018-04-12 MED FILL — HYDROCODONE-CHLORPHEN ER SU: 10-8 | 14 days supply | Qty: 140 | Fill #0

## 2018-04-14 ENCOUNTER — Other Ambulatory Visit: Payer: Self-pay | Admitting: Internal Medicine

## 2018-04-14 MED ORDER — FLUCONAZOLE 150 MG PO TABS: 150.0000 mg | ORAL_TABLET | Freq: Once | ORAL | 0 refills | Status: AC

## 2018-04-14 MED ORDER — AMOXICILLIN 875 MG PO TABS: 875.0000 mg | ORAL_TABLET | Freq: Two times a day (BID) | ORAL | 0 refills | Status: DC

## 2018-04-14 MED FILL — AMOXICILLIN 875 MG TABLET: 875 | 6 days supply | Qty: 12 | Fill #0

## 2018-04-14 MED FILL — FLUCONAZOLE 150 MG TABS: 150 | 2 days supply | Qty: 2 | Fill #0

## 2018-04-14 NOTE — Progress Notes (Signed)
7 days of sinus pressure, sore throat, tender neck, fever subjective, ear pain, rhinorrhea, postnasal drip, productive cough.  Youngest son recently ill with similar symptoms.  Older son now also sick with similar symptoms.  On exam, O2 sat 94 to 96% on room air.  Lungs are clear with good airflow bilaterally without wheezing or rhonchi.  Heart tacky but had just walked from the parking lot.  Diaphoretic.  Enlarged tonsils with erythema but no exudate.  Punctate hemorrhage-like lesion on the right tonsil.  No lymphadenopathy on the anterior cervical chain.  Likely viral but is now at day 7 without any improvement so we will start amoxicillin 875 twice daily for 6 days.  Diflucan for probable future candidal vaginitis.  Continue Tussionex, DayQuil, nasal corticosteroid, and ibuprofen.  Push fluids and rest.

## 2018-05-02 ENCOUNTER — Other Ambulatory Visit: Payer: Self-pay | Admitting: Internal Medicine

## 2018-05-02 DIAGNOSIS — Z1231 Encounter for screening mammogram for malignant neoplasm of breast: Secondary | ICD-10-CM

## 2018-05-04 MED FILL — VICTOZA 2-PAK 18 MG/3 ML PE: 18 | 90 days supply | Qty: 18 | Fill #1

## 2018-05-08 ENCOUNTER — Other Ambulatory Visit: Payer: Self-pay | Admitting: Dietician

## 2018-05-08 DIAGNOSIS — E1169 Type 2 diabetes mellitus with other specified complication: Secondary | ICD-10-CM

## 2018-05-08 NOTE — Progress Notes (Signed)
Referral request

## 2018-05-11 ENCOUNTER — Other Ambulatory Visit: Payer: Self-pay | Admitting: Internal Medicine

## 2018-05-11 DIAGNOSIS — R490 Dysphonia: Secondary | ICD-10-CM

## 2018-05-11 DIAGNOSIS — E041 Nontoxic single thyroid nodule: Secondary | ICD-10-CM

## 2018-05-11 NOTE — Progress Notes (Signed)
5 weeks sxs. Started with viral URI. Now with just hoarseness, cough, and sore throat. On PPI, OTC meds. Throat large tonsils, no exudate. LCTAB with good air flow. Likely post viral. No need for CXR. Will sch ENT referral and cancel if resolves.

## 2018-05-15 ENCOUNTER — Encounter: Payer: Self-pay | Admitting: Dietician

## 2018-05-15 ENCOUNTER — Ambulatory Visit (INDEPENDENT_AMBULATORY_CARE_PROVIDER_SITE_OTHER): Payer: 59 | Admitting: Dietician

## 2018-05-15 DIAGNOSIS — E1169 Type 2 diabetes mellitus with other specified complication: Secondary | ICD-10-CM

## 2018-05-15 NOTE — Progress Notes (Signed)
Documentation for Freestyle Libre Pro Continuous glucose monitoring Freestyle Libre Pro CGM sensor placed and started on [facility] who was identified by name and date of birth.  Patient was educated about wearing sensor, keeping food, activity and medication log and when to call office.She was educated about how to care for the sensor and not to have an MRI, CT or Diathermy while wearing the sensor. Follow up was arranged with the patient for 1 week and 2 weeks.   Lot #[id number] Serial #:[id number] Expiration Date:11/07/2018 Debera Lat, RD 05/15/2018 2:22 PM.

## 2018-05-22 ENCOUNTER — Ambulatory Visit (INDEPENDENT_AMBULATORY_CARE_PROVIDER_SITE_OTHER): Payer: 59 | Admitting: Internal Medicine

## 2018-05-22 ENCOUNTER — Encounter: Payer: Self-pay | Admitting: Dietician

## 2018-05-22 ENCOUNTER — Ambulatory Visit (INDEPENDENT_AMBULATORY_CARE_PROVIDER_SITE_OTHER): Payer: 59 | Admitting: Dietician

## 2018-05-22 ENCOUNTER — Other Ambulatory Visit: Payer: Self-pay

## 2018-05-22 VITALS — BP 143/87 | HR 97 | Temp 99.3°F | Ht 65.0 in | Wt 322.3 lb

## 2018-05-22 DIAGNOSIS — Z713 Dietary counseling and surveillance: Secondary | ICD-10-CM | POA: Diagnosis not present

## 2018-05-22 DIAGNOSIS — Z6841 Body Mass Index (BMI) 40.0 and over, adult: Secondary | ICD-10-CM

## 2018-05-22 DIAGNOSIS — Z7984 Long term (current) use of oral hypoglycemic drugs: Secondary | ICD-10-CM | POA: Diagnosis not present

## 2018-05-22 DIAGNOSIS — E1169 Type 2 diabetes mellitus with other specified complication: Secondary | ICD-10-CM

## 2018-05-22 LAB — POCT GLYCOSYLATED HEMOGLOBIN (HGB A1C): HEMOGLOBIN A1C: 10.5 % — AB (ref 4.0–5.6)

## 2018-05-22 LAB — GLUCOSE, CAPILLARY: Glucose-Capillary: 215 mg/dL — ABNORMAL HIGH (ref 70–99)

## 2018-05-22 NOTE — Assessment & Plan Note (Signed)
[facility] wore the CGM for 7 days. The average reading was 256, % time in target was 5, % time below target was 0, and % time above target was 95. No intervention necessary at this time. She was previously on Victoza but was out of this medication for several months. Previously, she states her glucose was controlled with victoza and [place] with her last Hba1c at 6.9 in April 2019. We will continue these medications and follow-up next week to see if her glucose decreases with compliance with her regular medications. The patient will be scheduled to see [facility] and Butch Penny for a final appointment.   - f/u one week  - cont. Victoza 1.2 mg qd and Januvia 100 mg qd

## 2018-05-22 NOTE — Progress Notes (Signed)
   CC: CGM download #1  HPI:  [name] is a 52 y.o. with PMH as below presenting for one week follow-up of CGM placement.  Please see A&P for assessment of the patient's chronic medical conditions.   Past Medical History:  Diagnosis Date  . Asthma    no intubation or hospitalization hx  . Depression   . Diabetes mellitus   . GERD (gastroesophageal reflux disease)   . HTN (hypertension)   . Hyperlipidemia    Review of Systems:   Review of Systems  Constitutional: Positive for diaphoresis. Negative for chills, fever and weight loss.  HENT: Positive for congestion. Negative for sinus pain and sore throat.   Respiratory: Positive for cough and wheezing. Negative for shortness of breath.   Gastrointestinal: Negative for constipation, diarrhea, nausea and vomiting.  Genitourinary: Negative for dysuria, frequency and urgency.  Neurological: Negative for dizziness, tingling, weakness and headaches.  Psychiatric/Behavioral: Negative for depression and suicidal ideas. The patient is not nervous/anxious.   All other systems reviewed and are negative.  Physical Exam:  Constitution: NAD, pleasant, obese Cardio: RRR, no m/r/g, no pitting edema  Respiratory: CTAB, no wheezing, rales, rhonchi Abdominal: NTTP, non-distended Neuro: A&O, cooperative Skin: c/d/i    Vitals:   05/22/18 1347  BP: (!) 143/87  Pulse: 97  Temp: 99.3 F (37.4 C)  TempSrc: Oral  SpO2: 100%  Weight: (!) 322 lb 4.8 oz (146.2 kg)  Height: 5\' 5"  (1.651 m)    Assessment & Plan:   See Encounters Tab for problem based charting.  Patient seen with Dr. Rebeca Alert

## 2018-05-22 NOTE — Progress Notes (Signed)
  Medical Nutrition Therapy:  Appt start time: 8403 end time:  1330. Visit # 1  Assessment:  Primary concerns today: getting sensor affixed better.  Shade says she feels bad today and wants to go get lunch. when we reviewed her Continuous glucose monitor download and she saw her blood sugar was high she said she drank juice before coming to the office to prevent a low blood sugar. she thinks her body is becoming re accustomed to being at a lower value and thus she thinks she gets symptoms of low at a higher value. She is thinking about attending classes either here or [facility] or Medical Nutrition Therapy here.  Usual physical activity: not discussed today Preferred Learning Style: No preference indicated  Learning Readiness: Contemplating  ANTHROPOMETRICS: Estimated body mass index is 53.63 kg/m as calculated from the following:   Height as of an earlier encounter on 05/22/18: 5\' 5"  (1.651 m).   Weight as of an earlier encounter on 05/22/18: 322 lb 4.8 oz (146.2 kg).  WEIGHT HISTORY:  Wt Readings from Last 5 Encounters:  05/22/18 (!) 322 lb 4.8 oz (146.2 kg)  01/12/18 (!) 323 lb 11.2 oz (146.8 kg)  01/10/18 (!) 318 lb (144.2 kg)  07/21/17 (!) 317 lb 3.2 oz (143.9 kg)  07/05/17 (!) 322 lb 14.4 oz (146.5 kg)   SLEEP:wake time 6 am, bedtime 9-11 PM  MEDICATIONS: has been taking her [place] for the entire week. She restarted victoza last Thursday or Friday BLOOD SUGAR: DIETARY INTAKE: Usual eating pattern includes 3 meals  With meal times 7-8am, 12-1 Pm and 530-6 PM. We did not discuss snacks per day. 24-hr recall: not done today. Dining Out (times/week):not discussed today  Progress towards Goal(s):  In progress.   Nutritional Diagnosis:  NB-1.4 Self-monitoring deficit As related to not knowing if her blood sugar is high or low.  As evidenced by her report of not chekcing and thinking her blood sugar is low when it is actually.   NB 1.2 nutrition related knowledge deficit as related to  not realizing that her diabetes medicines do not cause low blood sugar as evidenced by her fear of low blood sugar today.     Intervention:  Nutrition education about. Coordination of care: discussed [facility] Continuous glucose monitor download with Dr. Sharon Seller  Teaching Method Utilized: Visual,Auditory,Hands on Handouts given during visit include:booklets on meal planning, staying on track and diabetes medicine Barriers to learning/adherence to lifestyle change: competing values Demonstrated degree of understanding via:  Teach Back   Monitoring/Evaluation:  Dietary intake, exercise, meter, and body weight in 1 week(s)  Debera Lat, RD 05/22/2018 2:42 PM. .

## 2018-05-22 NOTE — Patient Instructions (Addendum)
Thank you for allowing [place] to provide your care today. Today we discussed you Type II Diabetes Mellitis.   Please return in 7 days with [facility] for Download #2.   I have ordered HbA1c labs for you. I will call if any are abnormal.    Today we made no changes to your medications.    Please follow-up in one week for CGM check.    Should you have any questions or concerns please call the internal medicine clinic at [phone].

## 2018-05-23 MED FILL — JANUVIA 100 MG TABLET: 100 | 90 days supply | Qty: 90 | Fill #3

## 2018-05-23 MED FILL — buPROPion HCL ER (XL) 150 M: 150 | 90 days supply | Qty: 90 | Fill #2

## 2018-05-23 MED FILL — ESCITALOPRAM 10 MG TABLET: 10 | 90 days supply | Qty: 180 | Fill #2

## 2018-05-25 ENCOUNTER — Ambulatory Visit
Admission: RE | Admit: 2018-05-25 | Discharge: 2018-05-25 | Disposition: A | Discharge status: Home / Self Care | Payer: 59 | Source: Ambulatory Visit

## 2018-05-25 DIAGNOSIS — Z1231 Encounter for screening mammogram for malignant neoplasm of breast: Secondary | ICD-10-CM

## 2018-05-29 ENCOUNTER — Other Ambulatory Visit: Payer: Self-pay | Admitting: Internal Medicine

## 2018-05-29 ENCOUNTER — Other Ambulatory Visit: Payer: Self-pay

## 2018-05-29 ENCOUNTER — Ambulatory Visit: Payer: 59 | Admitting: Dietician

## 2018-05-29 ENCOUNTER — Ambulatory Visit (INDEPENDENT_AMBULATORY_CARE_PROVIDER_SITE_OTHER): Payer: 59 | Admitting: Internal Medicine

## 2018-05-29 ENCOUNTER — Encounter: Payer: Self-pay | Admitting: Dietician

## 2018-05-29 DIAGNOSIS — E041 Nontoxic single thyroid nodule: Secondary | ICD-10-CM

## 2018-05-29 DIAGNOSIS — E1169 Type 2 diabetes mellitus with other specified complication: Secondary | ICD-10-CM

## 2018-05-29 MED ORDER — BUPROPION HCL ER (XL) 150 MG PO TB24: 150.0000 mg | ORAL_TABLET | Freq: Every day | ORAL | 3 refills | Status: DC

## 2018-05-29 NOTE — Progress Notes (Signed)
Internal Medicine Clinic Attending  I saw and evaluated the patient.  I personally confirmed the key portions of the history and exam documented by Dr. Seawell and I reviewed pertinent patient test results.  The assessment, diagnosis, and plan were formulated together and I agree with the documentation in the resident's note.  Alexander Raines, M.D., Ph.D.  

## 2018-05-29 NOTE — Patient Instructions (Signed)
Thank you for allowing [place] to provide your care today. Today we discussed your type II diabetes.    Today we made increased your Victoza to 1.2 mg a day.  Please also start taking your Januvia 100mg  at breakfast or lunch to avoid possible low blood sugars while you are sleeping late at night/early morning.  Please follow-up in one week with Butch Penny to see how your glucose readings are going. Please monitor your glucose before breakfast, lunch and dinner and bring your glucometer with you at follow-up.    We will have you follow-up in three weeks for HbA1c check.   Should you have any questions or concerns please call the internal medicine clinic at [phone].

## 2018-05-29 NOTE — Progress Notes (Signed)
   CC: CGM download 2  HPI:  [name] is a 52 y.o. with PMH as below for her second CGM download   Please see A&P for assessment of the patient's chronic medical conditions.   Past Medical History:  Diagnosis Date  . Asthma    no intubation or hospitalization hx  . Depression   . Diabetes mellitus   . GERD (gastroesophageal reflux disease)   . HTN (hypertension)   . Hyperlipidemia    Review of Systems:   Review of Systems  Cardiovascular: Negative for chest pain and leg swelling.  Gastrointestinal: Negative for nausea.  Genitourinary: Negative for dysuria, frequency and urgency.  Neurological: Negative for dizziness, tingling, sensory change and weakness.   Physical Exam:  Constitution: NAD, cooperative, well-developed Cardio: RRR, no m/r/g Respiratory: CTAB, no wheezing or crackles MSK: no edema   Vitals:   05/29/18 1359  BP: 119/60  Pulse: 90  Temp: 98.2 F (36.8 C)  TempSrc: Oral  SpO2: 100%  Weight: (!) 324 lb 12.8 oz (147.3 kg)  Height: 5\' 5"  (1.651 m)    Assessment & Plan:   See Encounters Tab for problem based charting.  Patient seen with Denise Stevens

## 2018-05-29 NOTE — Progress Notes (Signed)
img [id number]

## 2018-05-30 NOTE — Progress Notes (Signed)
Documentation:  Start time: 120 PM         End time: 1:45 PM  CGM download week #2. Patient brought sensor in that was downloaded. The report was reviewed with her. It showed progressively lower and more blood sugars in target. She just started the increased dose of Victoza today of 1.2 mg/day. She is interested in learning about lower glycemic load food choices. We also discussed the effect of fat in foods on blood sugar response. She was able to see the effects of her medication and food choices on her glycemic control/Continuous glucose monitor report.   Plan: information provided on lower glycemic load food choices. Recommend Medical Nutrition Therapy as desired by [name].  Debera Lat, RD 05/30/2018 1:37 PM.

## 2018-05-30 NOTE — Assessment & Plan Note (Addendum)
Denise Stevens wore the CGM for 14 days. The average reading was 176, % time in target was 44, % time below target was 0, and % time above target was. 56. Intervention will be to continue current medical management as patient has begun taking her Victoza again. The past two weeks at a dosage of .6mg . Today she will increase to her original dose, 1.2 mg. She has agreed to check her glucose before breakfast lunch and dinner. To avoid low glucose levels we also discussed trying to take her Januvia consistently at breakfast or lunch instead of with dinner.   - increase back to victoza 1.2mg  today - f/u one week with Butch Penny to assess glucometer readings - f/u one month for HbA1c

## 2018-05-31 NOTE — Progress Notes (Signed)
Internal Medicine Clinic Attending  I saw and evaluated the patient.  I personally confirmed the key portions of the history and exam documented by Dr. Seawell and I reviewed pertinent patient test results.  The assessment, diagnosis, and plan were formulated together and I agree with the documentation in the resident's note.     

## 2018-06-05 ENCOUNTER — Other Ambulatory Visit: Payer: Self-pay | Admitting: Internal Medicine

## 2018-06-05 MED ORDER — PANTOPRAZOLE SODIUM 40 MG PO TBEC: 40.0000 mg | DELAYED_RELEASE_TABLET | Freq: Every day | ORAL | 3 refills | Status: DC

## 2018-06-05 MED ORDER — FLUTICASONE PROPIONATE 50 MCG/ACT NA SUSP: 1.0000 | Freq: Two times a day (BID) | NASAL | 3 refills | Status: DC

## 2018-06-05 MED FILL — FLUTICASONE PROP 50 MCG SPR: 50 | 90 days supply | Qty: 48 | Fill #0

## 2018-06-05 MED FILL — PANTOPRAZOLE SOD DR 40 MG T: 40 | 90 days supply | Qty: 90 | Fill #0

## 2018-06-06 MED FILL — MELOXICAM 15 MG TABLET: 15 | 60 days supply | Qty: 60 | Fill #0

## 2018-06-08 DIAGNOSIS — R490 Dysphonia: Secondary | ICD-10-CM | POA: Insufficient documentation

## 2018-06-08 DIAGNOSIS — E0789 Other specified disorders of thyroid: Secondary | ICD-10-CM | POA: Diagnosis not present

## 2018-06-08 DIAGNOSIS — J343 Hypertrophy of nasal turbinates: Secondary | ICD-10-CM | POA: Diagnosis not present

## 2018-06-13 ENCOUNTER — Ambulatory Visit (HOSPITAL_COMMUNITY)
Admission: RE | Admit: 2018-06-13 | Discharge: 2018-06-13 | Disposition: A | Discharge status: Home / Self Care | Payer: 59 | Source: Ambulatory Visit | Attending: Internal Medicine | Admitting: Internal Medicine

## 2018-06-13 DIAGNOSIS — E041 Nontoxic single thyroid nodule: Secondary | ICD-10-CM | POA: Diagnosis not present

## 2018-06-14 ENCOUNTER — Other Ambulatory Visit: Payer: Self-pay | Admitting: Internal Medicine

## 2018-06-14 MED ORDER — PANTOPRAZOLE SODIUM 40 MG PO TBEC: 40.0000 mg | DELAYED_RELEASE_TABLET | Freq: Two times a day (BID) | ORAL | 3 refills | Status: DC

## 2018-06-29 DIAGNOSIS — J383 Other diseases of vocal cords: Secondary | ICD-10-CM | POA: Diagnosis not present

## 2018-06-29 DIAGNOSIS — F444 Conversion disorder with motor symptom or deficit: Secondary | ICD-10-CM | POA: Diagnosis not present

## 2018-07-10 ENCOUNTER — Other Ambulatory Visit: Payer: Self-pay | Admitting: Internal Medicine

## 2018-07-10 DIAGNOSIS — L918 Other hypertrophic disorders of the skin: Secondary | ICD-10-CM

## 2018-07-10 NOTE — Progress Notes (Signed)
Ref

## 2018-08-07 ENCOUNTER — Other Ambulatory Visit: Payer: Self-pay | Admitting: Internal Medicine

## 2018-08-07 DIAGNOSIS — E119 Type 2 diabetes mellitus without complications: Secondary | ICD-10-CM

## 2018-08-07 MED FILL — PRAVASTATIN NA 40 MG TAB: 40 | 90 days supply | Qty: 90 | Fill #1

## 2018-08-16 NOTE — Addendum Note (Signed)
Addended by: Hulan Fray on: 08/16/2018 07:25 PM   Modules accepted: Orders

## 2018-08-28 ENCOUNTER — Other Ambulatory Visit: Payer: Self-pay | Admitting: Internal Medicine

## 2018-08-28 MED FILL — buPROPion HCL ER (XL) 150 M: 150 | 90 days supply | Qty: 90 | Fill #3

## 2018-08-28 MED FILL — PANTOPRAZOLE SOD DR 40 MG T: 40 | 90 days supply | Qty: 90 | Fill #1

## 2018-08-29 MED FILL — ESCITALOPRAM 10 MG TABLET: 10 | 90 days supply | Qty: 180 | Fill #0

## 2018-09-04 ENCOUNTER — Encounter: Payer: Self-pay | Admitting: Internal Medicine

## 2018-09-06 ENCOUNTER — Other Ambulatory Visit: Payer: Self-pay | Admitting: Internal Medicine

## 2018-09-06 MED ORDER — PREDNISONE 20 MG PO TABS: 40.0000 mg | ORAL_TABLET | Freq: Every day | ORAL | 0 refills | Status: DC

## 2018-09-06 MED ORDER — GLIPIZIDE 5 MG PO TABS: 5.0000 mg | ORAL_TABLET | Freq: Every day | ORAL | 0 refills | Status: DC

## 2018-09-06 MED FILL — glipiZIDE 5 MG TABS: 5 | 60 days supply | Qty: 60 | Fill #0

## 2018-09-06 MED FILL — predniSONE 20 MG TABS: 20 | 5 days supply | Qty: 10 | Fill #0

## 2018-09-07 ENCOUNTER — Other Ambulatory Visit: Payer: Self-pay | Admitting: Internal Medicine

## 2018-09-07 MED ORDER — PREDNISONE 20 MG PO TABS: 40.0000 mg | ORAL_TABLET | Freq: Every day | ORAL | 0 refills | Status: DC

## 2018-09-08 ENCOUNTER — Other Ambulatory Visit: Payer: Self-pay | Admitting: Internal Medicine

## 2018-09-08 MED ORDER — METAXALONE 800 MG PO TABS: 800.0000 mg | ORAL_TABLET | Freq: Three times a day (TID) | ORAL | 0 refills | Status: DC | PRN

## 2018-09-08 MED FILL — METAXALONE 800 MG TABLET: 800 | 6 days supply | Qty: 20 | Fill #0

## 2018-09-14 ENCOUNTER — Other Ambulatory Visit: Payer: Self-pay | Admitting: Internal Medicine

## 2018-09-14 MED ORDER — METAXALONE 800 MG PO TABS: 800.0000 mg | ORAL_TABLET | Freq: Three times a day (TID) | ORAL | 0 refills | Status: DC | PRN

## 2018-09-14 MED FILL — METAXALONE 800 MG TABLET: 800 | 14 days supply | Qty: 40 | Fill #0

## 2018-10-13 ENCOUNTER — Other Ambulatory Visit: Payer: Self-pay | Admitting: Internal Medicine

## 2018-10-13 DIAGNOSIS — R1013 Epigastric pain: Secondary | ICD-10-CM

## 2018-10-13 DIAGNOSIS — R6889 Other general symptoms and signs: Secondary | ICD-10-CM

## 2018-10-13 DIAGNOSIS — E1169 Type 2 diabetes mellitus with other specified complication: Secondary | ICD-10-CM

## 2018-10-26 ENCOUNTER — Encounter: Payer: 59 | Admitting: Internal Medicine

## 2018-11-22 ENCOUNTER — Telehealth: Payer: Self-pay | Admitting: Dietician

## 2018-11-27 ENCOUNTER — Encounter: Payer: Self-pay | Admitting: Dietician

## 2018-11-27 ENCOUNTER — Other Ambulatory Visit: Payer: Self-pay | Admitting: Internal Medicine

## 2018-11-27 MED ORDER — METAXALONE 800 MG PO TABS: 800.0000 mg | ORAL_TABLET | Freq: Three times a day (TID) | ORAL | 1 refills | Status: DC | PRN

## 2018-11-27 MED FILL — METAXALONE 800 MG TABLET: 800 | 13 days supply | Qty: 40 | Fill #0 | Status: TO

## 2018-11-27 NOTE — Telephone Encounter (Signed)
Unable to reach by phone; will send letter.

## 2018-12-07 ENCOUNTER — Encounter: Payer: 59 | Admitting: Internal Medicine

## 2018-12-14 ENCOUNTER — Other Ambulatory Visit: Payer: Self-pay | Admitting: Internal Medicine

## 2018-12-14 MED ORDER — FLUCONAZOLE 150 MG PO TABS: 150.0000 mg | ORAL_TABLET | Freq: Every day | ORAL | 0 refills | Status: DC

## 2018-12-14 MED FILL — FLUCONAZOLE 150 MG TABS: 150 | 3 days supply | Qty: 2 | Fill #0

## 2018-12-14 NOTE — Progress Notes (Signed)
diflucan 

## 2018-12-19 ENCOUNTER — Other Ambulatory Visit: Payer: Self-pay | Admitting: Internal Medicine

## 2018-12-19 DIAGNOSIS — E119 Type 2 diabetes mellitus without complications: Secondary | ICD-10-CM

## 2018-12-19 MED ORDER — GLUCOSE BLOOD VI STRP: ORAL_STRIP | 11 refills | Status: DC

## 2018-12-22 MED FILL — PANTOPRAZOLE SOD DR 40 MG T: 40 | 90 days supply | Qty: 90 | Fill #0 | Status: TO

## 2018-12-22 MED FILL — PRAVASTATIN NA 40 MG TAB: 40 | 90 days supply | Qty: 90 | Fill #0 | Status: TO

## 2018-12-25 ENCOUNTER — Telehealth: Payer: Self-pay | Admitting: *Deleted

## 2018-12-25 NOTE — Telephone Encounter (Signed)
Pharmacy calls and states valsartan- hctz 160/25mg  is on national back order. Losartan/hctz is a possible replacement susan said. Could you please send a new script to [facility].

## 2018-12-26 ENCOUNTER — Other Ambulatory Visit: Payer: Self-pay | Admitting: *Deleted

## 2018-12-26 ENCOUNTER — Other Ambulatory Visit: Payer: Self-pay | Admitting: Internal Medicine

## 2018-12-26 MED ORDER — LOSARTAN POTASSIUM-HCTZ 100-25 MG PO TABS: 1.0000 | ORAL_TABLET | Freq: Every day | ORAL | 3 refills | Status: DC

## 2018-12-26 MED FILL — LOSARTAN-HCTZ 100-25 MG TAB: 100-25 | 30 days supply | Qty: 30 | Fill #0

## 2018-12-26 NOTE — Telephone Encounter (Signed)
done

## 2018-12-29 ENCOUNTER — Other Ambulatory Visit: Payer: Self-pay | Admitting: Internal Medicine

## 2018-12-29 MED ORDER — GABAPENTIN 300 MG PO CAPS: ORAL_CAPSULE | ORAL | 2 refills | Status: DC

## 2018-12-29 MED FILL — GABAPENTIN 300 MG CAPSULE: 300 | 30 days supply | Qty: 90 | Fill #0

## 2018-12-29 NOTE — Progress Notes (Signed)
RUE neuropathic pain

## 2019-01-02 MED FILL — METAXALONE 800 MG TABLET: 800 | 13 days supply | Qty: 40 | Fill #0

## 2019-01-04 ENCOUNTER — Other Ambulatory Visit: Payer: Self-pay | Admitting: Internal Medicine

## 2019-01-04 DIAGNOSIS — M25562 Pain in left knee: Secondary | ICD-10-CM

## 2019-01-04 DIAGNOSIS — H2513 Age-related nuclear cataract, bilateral: Secondary | ICD-10-CM | POA: Diagnosis not present

## 2019-01-04 DIAGNOSIS — H524 Presbyopia: Secondary | ICD-10-CM | POA: Diagnosis not present

## 2019-01-04 DIAGNOSIS — E119 Type 2 diabetes mellitus without complications: Secondary | ICD-10-CM | POA: Diagnosis not present

## 2019-01-04 DIAGNOSIS — H25013 Cortical age-related cataract, bilateral: Secondary | ICD-10-CM | POA: Diagnosis not present

## 2019-01-04 DIAGNOSIS — H43811 Vitreous degeneration, right eye: Secondary | ICD-10-CM | POA: Diagnosis not present

## 2019-01-04 LAB — HM DIABETES EYE EXAM

## 2019-01-05 ENCOUNTER — Encounter: Payer: Self-pay | Admitting: *Deleted

## 2019-01-08 ENCOUNTER — Other Ambulatory Visit: Payer: Self-pay

## 2019-01-08 ENCOUNTER — Ambulatory Visit: Payer: 59 | Admitting: Sports Medicine

## 2019-01-08 ENCOUNTER — Ambulatory Visit
Admission: RE | Admit: 2019-01-08 | Discharge: 2019-01-08 | Disposition: A | Discharge status: Home / Self Care | Payer: 59 | Source: Ambulatory Visit | Attending: Sports Medicine | Admitting: Sports Medicine

## 2019-01-08 ENCOUNTER — Encounter: Payer: Self-pay | Admitting: Sports Medicine

## 2019-01-08 VITALS — BP 136/78 | Ht 65.5 in | Wt 314.0 lb

## 2019-01-08 DIAGNOSIS — M25562 Pain in left knee: Secondary | ICD-10-CM

## 2019-01-08 DIAGNOSIS — S8992XA Unspecified injury of left lower leg, initial encounter: Secondary | ICD-10-CM | POA: Diagnosis not present

## 2019-01-08 NOTE — Assessment & Plan Note (Addendum)
Concern for possible fracture of patella given ultrasound findings and persistent pain.  Also concern for quadriceps weakness in response to her injury, which is likely causing her instability.  Given double upright knee brace and handout on quadricep strengthening exercises.  Will order AP, lateral, and sunrise x-rays of left knee to evaluate for fracture.  If this is positive for fracture, she will continue with conservative therapy since it has been about 2 months since her injury.  Will call patient with results as soon as x-ray is done.  We will follow-up with patient in 1 month or sooner depending on x-ray results.

## 2019-01-08 NOTE — Progress Notes (Addendum)
Subjective:    Denise Stevens - 53 y.o. female MRN [id number]  Date of birth: [birth year]  CC:  Denise Stevens is here for left knee pain.  HPI: Denise Stevens reports that on April 1, she was going down stairs in her apartment complex when she missed the last 2 stairs and fell onto her left knee.  She immediately had pain and difficulty with ambulation as well as a significant abrasion.  She used ice and the meloxicam she already takes for Achilles tendinopathy for relief.  She had some improvement in her ambulation and pain, but she still experiences difficulty climbing and descending stairs, and she will feel like her leg will give out when she is standing.  It is also painful for her to bear weight on her left knee.  She did not want to go to the office after her injury due to COVID-19, but she did request Skelaxin from her PCP, which she said was somewhat helpful.  She denies any recent swelling or bruising.  Health Maintenance:  Health Maintenance Due  Topic Date Due  . HEMOGLOBIN A1C  11/21/2018  . PAP SMEAR-Modifier  12/05/2018    -  reports that she has never smoked. She has never used smokeless tobacco. - Review of Systems: Per HPI. - Past Medical History: Patient Active Problem List   Diagnosis Date Noted  . Acute pain of left knee 01/08/2019  . 'light-for-dates' infant with signs of fetal malnutrition 05/29/2018  . Acute intractable headache 03/01/2018  . Osteoarthritis of left knee 07/05/2017  . Thyroid nodule 06/26/2016  . Cervical disc disorder with radiculopathy of cervical region 04/01/2016  . Greater trochanteric bursitis of both hips 04/01/2016  . Tendinopathy of rotator cuff 08/27/2015  . Epicondylitis, medial humeral 08/27/2015  . Hyperlipidemia 06/05/2015  . GERD (gastroesophageal reflux disease) 06/05/2015  . Allergic rhinitis 06/05/2015  . Keratoconjunctivitis sicca ([place]) 06/05/2015  . Health care maintenance 06/05/2015  . Morbid obesity ([place]) 03/31/2015  .  Carpal tunnel syndrome, left 12/15/2014  . Essential hypertension 08/06/2014  . Dysphoric mood 08/06/2014  . Obstructive sleep apnea 01/21/2014  . Type 2 diabetes mellitus with other specified complication ([place]) 56/38/9373  . TROCHANTERIC BURSITIS, LEFT 01/17/2009  . UNEQUAL LEG LENGTH 01/17/2009   - Medications: reviewed and updated   Objective:   Physical Exam BP 136/78   Ht 5' 5.5" (1.664 m)   Wt (!) 314 lb (142.4 kg)   BMI 51.46 kg/m  Gen: NAD, alert, cooperative with exam, well-appearing, morbidly obese Musculoskeletal: No visual deformity of the left knee other than a healed abrasion on the patella.  Tender to palpation on the superior aspect of the left patella.  Normal range of motion and 5/5 quadriceps strength.  Anterior and posterior drawer, Thessaly, valgus and varus stress were normal.  Normal gait, although patient reports pain with bearing weight on her left leg.   Ultrasound of left knee shows cortical irregularity and a spur of the left patella.  No fluid collections or tendon tears are visualized.      Assessment & Plan:   Acute pain of left knee Concern for possible fracture of patella given ultrasound findings and persistent pain.  Also concern for quadriceps weakness in response to her injury, which is likely causing her instability.  Given double upright knee brace and handout on quadricep strengthening exercises.  Will order AP, lateral, and sunrise x-rays of left knee to evaluate for fracture.  If this is positive for fracture, she  will continue with conservative therapy since it has been about 2 months since her injury.  Will call patient with results as soon as x-ray is done.  We will follow-up with patient in 1 month or sooner depending on x-ray results.    Maia Breslow, M.D. 01/08/2019, 11:56 AM PGY-2, [facility]  Patient seen and evaluated with the resident.  I agree with the above plan of care.  Patient's x-rays show some  calcifications at the distal quadriceps tendon as well as some peripatellar spurring.  Fairly well-preserved joint space.  No fracture.  Proceed with treatment as above and follow-up with me again in 4 weeks for reevaluation (WebEx visit would be appropriate if desired by the patient).

## 2019-01-11 ENCOUNTER — Telehealth: Payer: Self-pay | Admitting: Dietician

## 2019-01-11 ENCOUNTER — Other Ambulatory Visit: Payer: Self-pay | Admitting: Internal Medicine

## 2019-01-11 DIAGNOSIS — E1169 Type 2 diabetes mellitus with other specified complication: Secondary | ICD-10-CM

## 2019-01-11 MED ORDER — ACCU-CHEK FASTCLIX LANCETS MISC: 12 refills | Status: DC

## 2019-01-11 MED ORDER — GLUCOSE BLOOD VI STRP: ORAL_STRIP | 12 refills | Status: DC

## 2019-01-11 MED ORDER — ACCU-CHEK GUIDE W/DEVICE KIT: 1.0000 | PACK | Freq: Three times a day (TID) | 1 refills | Status: DC | PRN

## 2019-01-11 MED FILL — buPROPion HCL ER (XL) 150 M: 150 | 90 days supply | Qty: 90 | Fill #0

## 2019-01-11 MED FILL — JANUVIA 100 MG TABLET: 100 | 90 days supply | Qty: 90 | Fill #0

## 2019-01-11 MED FILL — ACCU-CHEK FASTCLIX LANCETS: 34 days supply | Qty: 102 | Fill #0

## 2019-01-11 MED FILL — ESCITALOPRAM 10 MG TABLET: 10 | 90 days supply | Qty: 180 | Fill #1

## 2019-01-11 NOTE — Telephone Encounter (Signed)
Diabetes testing supplies requested for accu chek guide.

## 2019-01-11 NOTE — Telephone Encounter (Signed)
Done

## 2019-01-11 NOTE — Telephone Encounter (Signed)
Donne - will you pls enter the correct testing supplies? I always get it wrong!

## 2019-01-15 MED FILL — ACCU-CHEK GUIDE TEST STRIP: 33 days supply | Qty: 100 | Fill #0

## 2019-01-24 ENCOUNTER — Other Ambulatory Visit: Payer: Self-pay

## 2019-01-26 ENCOUNTER — Ambulatory Visit (INDEPENDENT_AMBULATORY_CARE_PROVIDER_SITE_OTHER): Payer: 59 | Admitting: Obstetrics & Gynecology

## 2019-01-26 ENCOUNTER — Other Ambulatory Visit: Payer: Self-pay

## 2019-01-26 ENCOUNTER — Other Ambulatory Visit: Payer: Self-pay | Admitting: Internal Medicine

## 2019-01-26 ENCOUNTER — Encounter: Payer: Self-pay | Admitting: Obstetrics & Gynecology

## 2019-01-26 VITALS — BP 118/80 | Ht 65.0 in | Wt 323.0 lb

## 2019-01-26 DIAGNOSIS — E1169 Type 2 diabetes mellitus with other specified complication: Secondary | ICD-10-CM

## 2019-01-26 DIAGNOSIS — Z6841 Body Mass Index (BMI) 40.0 and over, adult: Secondary | ICD-10-CM | POA: Diagnosis not present

## 2019-01-26 DIAGNOSIS — Z01419 Encounter for gynecological examination (general) (routine) without abnormal findings: Secondary | ICD-10-CM | POA: Diagnosis not present

## 2019-01-26 DIAGNOSIS — D649 Anemia, unspecified: Secondary | ICD-10-CM

## 2019-01-26 DIAGNOSIS — Z90710 Acquired absence of both cervix and uterus: Secondary | ICD-10-CM | POA: Diagnosis not present

## 2019-01-26 DIAGNOSIS — Z780 Asymptomatic menopausal state: Secondary | ICD-10-CM

## 2019-01-26 NOTE — Patient Instructions (Signed)
1. Well female exam with routine gynecological exam Gynecologic exam status post total hysterectomy and menopause.  Last Pap test in 2018 was negative with negative high-risk HPV, no indication to repeat this year.  Breast exam normal.  Last screening mammogram October 2019 was negative.  Diabetes mellitus on Januvia and Victoza.  Health labs with family physician.  Last colonoscopy September 2016.  2. S/P total hysterectomy  3. Postmenopause Well on no hormone replacement therapy.  Decreased hot flushes with higher dose of Lexapro.  Vitamin D supplements, calcium intake of 1200 mg daily and regular weightbearing physical activity is recommended.  4. Class 3 severe obesity due to excess calories with serious comorbidity and body mass index (BMI) of 50.0 to 59.9 in adult [facility]) Recommend a lower calorie/carb diet such as [employer].  Patient will start doing yoga.  Aerobic physical activities recommended 5 times a week and weightlifting every 2 days.  Aislynn, it was a pleasure seeing you today!

## 2019-01-26 NOTE — Progress Notes (Signed)
Denise Stevens Mar 11, 1966 [id number]   History:    53 y.o. G2P2L2  Married  RP:  Established patient presenting for annual gyn exam   HPI: S/P Total Hysterectomy.  Menopause, well on no HRT.  Less hot flushes after increasing Lexapro last year.  No pelvic pain.  No pain with IC.  Urine/BMs normal.  Breasts normal.  BMI 53.75.  Planning to start Yoga.  DM on Januvia and Victoza.  Health labs with Fam MD.  Past medical history,surgical history, family history and social history were all reviewed and documented in the EPIC chart.  Gynecologic History No LMP recorded. Patient has had a hysterectomy. Contraception: status post hysterectomy Last Pap: 2018. Results were: Negative/HPV HR neg Last mammogram: 05/2018. Results were: Negative Bone Density: Never Colonoscopy: 04/2015  Obstetric History OB History  Gravida Para Term Preterm AB Living  2 2       2   SAB TAB Ectopic Multiple Live Births               # Outcome Date GA Lbr Len/2nd Weight Sex Delivery Anes PTL Lv  2 Para           1 Para              ROS: A ROS was performed and pertinent positives and negatives are included in the history.  GENERAL: No fevers or chills. HEENT: No change in vision, no earache, sore throat or sinus congestion. NECK: No pain or stiffness. CARDIOVASCULAR: No chest pain or pressure. No palpitations. PULMONARY: No shortness of breath, cough or wheeze. GASTROINTESTINAL: No abdominal pain, nausea, vomiting or diarrhea, melena or bright red blood per rectum. GENITOURINARY: No urinary frequency, urgency, hesitancy or dysuria. MUSCULOSKELETAL: No joint or muscle pain, no back pain, no recent trauma. DERMATOLOGIC: No rash, no itching, no lesions. ENDOCRINE: No polyuria, polydipsia, no heat or cold intolerance. No recent change in weight. HEMATOLOGICAL: No anemia or easy bruising or bleeding. NEUROLOGIC: No headache, seizures, numbness, tingling or weakness. PSYCHIATRIC: No depression, no loss of interest in  normal activity or change in sleep pattern.     Exam:   BP 118/80 (BP Location: Right Arm, Patient Position: Sitting, Cuff Size: Large)   Ht 5\' 5"  (1.651 m)   Wt (!) 323 lb (146.5 kg)   BMI 53.75 kg/m   Body mass index is 53.75 kg/m.  General appearance : Well developed well nourished female. No acute distress HEENT: Eyes: no retinal hemorrhage or exudates,  Neck supple, trachea midline, no carotid bruits, no thyroidmegaly Lungs: Clear to auscultation, no rhonchi or wheezes, or rib retractions  Heart: Regular rate and rhythm, no murmurs or gallops Breast:Examined in sitting and supine position were symmetrical in appearance, no palpable masses or tenderness,  no skin retraction, no nipple inversion, no nipple discharge, no skin discoloration, no axillary or supraclavicular lymphadenopathy Abdomen: no palpable masses or tenderness, no rebound or guarding Extremities: no edema or skin discoloration or tenderness  Pelvic: Vulva: Normal             Vagina: No gross lesions or discharge  Cervix: No gross lesions or discharge  Uterus  AV, normal size, shape and consistency, non-tender and mobile  Adnexa  Without masses or tenderness  Anus: Normal   Assessment/Plan:  53 y.o. female for annual exam   1. Well female exam with routine gynecological exam Gynecologic exam status post total hysterectomy and menopause.  Last Pap test in 2018 was negative with negative high-risk  HPV, no indication to repeat this year.  Breast exam normal.  Last screening mammogram October 2019 was negative.  Diabetes mellitus on Januvia and Victoza.  Health labs with family physician.  Last colonoscopy September 2016.  2. S/P total hysterectomy  3. Postmenopause Well on no hormone replacement therapy.  Decreased hot flushes with higher dose of Lexapro.  Vitamin D supplements, calcium intake of 1200 mg daily and regular weightbearing physical activity is recommended.  4. Class 3 severe obesity due to excess  calories with serious comorbidity and body mass index (BMI) of 50.0 to 59.9 in adult [facility]) Recommend a lower calorie/carb diet such as [employer].  Patient will start doing yoga.  Aerobic physical activities recommended 5 times a week and weightlifting every 2 days.  Princess Bruins MD, 2:42 PM 01/26/2019

## 2019-01-30 ENCOUNTER — Other Ambulatory Visit: Payer: 59

## 2019-02-05 MED FILL — LOSARTAN-HCTZ 100-25 MG TAB: 100-25 | 30 days supply | Qty: 30 | Fill #0

## 2019-02-07 ENCOUNTER — Other Ambulatory Visit: Payer: Self-pay | Admitting: Internal Medicine

## 2019-02-07 DIAGNOSIS — Z1231 Encounter for screening mammogram for malignant neoplasm of breast: Secondary | ICD-10-CM

## 2019-02-21 ENCOUNTER — Other Ambulatory Visit: Payer: Self-pay | Admitting: Internal Medicine

## 2019-02-21 DIAGNOSIS — E1169 Type 2 diabetes mellitus with other specified complication: Secondary | ICD-10-CM

## 2019-02-21 DIAGNOSIS — R6889 Other general symptoms and signs: Secondary | ICD-10-CM

## 2019-02-26 ENCOUNTER — Other Ambulatory Visit: Payer: 59

## 2019-03-05 MED FILL — UNIFINE PENTIPS 31GX3/16: 31G X 5 MM | 90 days supply | Qty: 100 | Fill #1

## 2019-03-06 ENCOUNTER — Other Ambulatory Visit: Payer: Self-pay

## 2019-03-06 ENCOUNTER — Other Ambulatory Visit (INDEPENDENT_AMBULATORY_CARE_PROVIDER_SITE_OTHER): Payer: 59

## 2019-03-06 ENCOUNTER — Other Ambulatory Visit: Payer: Self-pay | Admitting: Internal Medicine

## 2019-03-06 DIAGNOSIS — D649 Anemia, unspecified: Secondary | ICD-10-CM | POA: Diagnosis not present

## 2019-03-06 DIAGNOSIS — E1169 Type 2 diabetes mellitus with other specified complication: Secondary | ICD-10-CM | POA: Diagnosis not present

## 2019-03-06 DIAGNOSIS — Z7689 Persons encountering health services in other specified circumstances: Secondary | ICD-10-CM | POA: Diagnosis not present

## 2019-03-06 DIAGNOSIS — E041 Nontoxic single thyroid nodule: Secondary | ICD-10-CM

## 2019-03-06 DIAGNOSIS — R6889 Other general symptoms and signs: Secondary | ICD-10-CM

## 2019-03-06 LAB — POCT GLYCOSYLATED HEMOGLOBIN (HGB A1C): Hemoglobin A1C: 8.9 % — AB (ref 4.0–5.6)

## 2019-03-06 LAB — GLUCOSE, CAPILLARY: Glucose-Capillary: 144 mg/dL — ABNORMAL HIGH (ref 70–99)

## 2019-03-07 LAB — CMP14 + ANION GAP
ALT: 26 IU/L (ref 0–32)
AST: 14 IU/L (ref 0–40)
Albumin/Globulin Ratio: 1.2 (ref 1.2–2.2)
Albumin: 4.3 g/dL (ref 3.8–4.9)
Alkaline Phosphatase: 97 IU/L (ref 39–117)
Anion Gap: 23 mmol/L — ABNORMAL HIGH (ref 10.0–18.0)
BUN/Creatinine Ratio: 14 (ref 9–23)
BUN: 12 mg/dL (ref 6–24)
Bilirubin Total: 0.3 mg/dL (ref 0.0–1.2)
CO2: 19 mmol/L — ABNORMAL LOW (ref 20–29)
Calcium: 9.4 mg/dL (ref 8.7–10.2)
Chloride: 94 mmol/L — ABNORMAL LOW (ref 96–106)
Creatinine, Ser: 0.87 mg/dL (ref 0.57–1.00)
GFR calc Af Amer: 87 mL/min/{1.73_m2} (ref >59)
GFR calc non Af Amer: 76 mL/min/{1.73_m2} (ref >59)
Globulin, Total: 3.5 g/dL (ref 1.5–4.5)
Glucose: 137 mg/dL — ABNORMAL HIGH (ref 65–99)
Potassium: 3.9 mmol/L (ref 3.5–5.2)
Sodium: 136 mmol/L (ref 134–144)
Total Protein: 7.8 g/dL (ref 6.0–8.5)

## 2019-03-07 LAB — TSH: TSH: 2.89 u[IU]/mL (ref 0.450–4.500)

## 2019-03-07 LAB — CBC
Hematocrit: 39.3 % (ref 34.0–46.6)
Hemoglobin: 12.8 g/dL (ref 11.1–15.9)
MCH: 27.4 pg (ref 26.6–33.0)
MCHC: 32.6 g/dL (ref 31.5–35.7)
MCV: 84 fL (ref 79–97)
Platelets: 334 10*3/uL (ref 150–450)
RBC: 4.67 x10E6/uL (ref 3.77–5.28)
RDW: 13.4 % (ref 11.7–15.4)
WBC: 12.7 10*3/uL — ABNORMAL HIGH (ref 3.4–10.8)

## 2019-03-07 LAB — VITAMIN B12: Vitamin B-12: 349 pg/mL (ref 232–1245)

## 2019-03-07 LAB — FERRITIN: Ferritin: 246 ng/mL — ABNORMAL HIGH (ref 15–150)

## 2019-03-08 LAB — WHITE BLOOD COUNT AND DIFFERENTIAL
Basophils Absolute: 0 10*3/uL (ref 0.0–0.2)
Basos: 0 %
EOS (ABSOLUTE): 0 10*3/uL (ref 0.0–0.4)
Eos: 0 %
Immature Grans (Abs): 0 10*3/uL (ref 0.0–0.1)
Immature Granulocytes: 0 %
Lymphocytes Absolute: 3.3 10*3/uL — ABNORMAL HIGH (ref 0.7–3.1)
Lymphs: 25 %
Monocytes Absolute: 0.7 10*3/uL (ref 0.1–0.9)
Monocytes: 6 %
Neutrophils Absolute: 8.9 10*3/uL — ABNORMAL HIGH (ref 1.4–7.0)
Neutrophils: 69 %
WBC: 13 10*3/uL — ABNORMAL HIGH (ref 3.4–10.8)

## 2019-03-08 LAB — SPECIMEN STATUS REPORT

## 2019-03-14 ENCOUNTER — Other Ambulatory Visit: Payer: Self-pay | Admitting: Internal Medicine

## 2019-03-14 DIAGNOSIS — E1169 Type 2 diabetes mellitus with other specified complication: Secondary | ICD-10-CM

## 2019-03-19 MED FILL — LOSARTAN-HCTZ 100-25 MG TAB: 100-25 | 30 days supply | Qty: 30 | Fill #1

## 2019-03-23 ENCOUNTER — Other Ambulatory Visit: Payer: Self-pay | Admitting: Internal Medicine

## 2019-03-23 MED FILL — GABAPENTIN 300 MG CAPSULE: 300 | 30 days supply | Qty: 90 | Fill #0

## 2019-03-26 MED FILL — METAXALONE 800 MG TABS: 800 | 13 days supply | Qty: 40 | Fill #0

## 2019-03-28 MED FILL — AMOX-CLAV 875-125 MG TABLET: 875-125 | 10 days supply | Qty: 20 | Fill #0

## 2019-03-28 MED FILL — CHLORHEXIDINE 0.12% RINSE: 0.12 | 15 days supply | Qty: 473 | Fill #0

## 2019-04-03 ENCOUNTER — Other Ambulatory Visit: Payer: Self-pay | Admitting: Internal Medicine

## 2019-04-03 MED ORDER — PRAVASTATIN SODIUM 40 MG PO TABS: 40.0000 mg | ORAL_TABLET | Freq: Every day | ORAL | 3 refills | Status: DC

## 2019-04-03 MED ORDER — SEMAGLUTIDE 3 MG PO TABS: 3.0000 mg | ORAL_TABLET | Freq: Every day | ORAL | 0 refills | Status: DC

## 2019-04-03 MED ORDER — FLUTICASONE PROPIONATE 50 MCG/ACT NA SUSP: 1.0000 | Freq: Two times a day (BID) | NASAL | 3 refills | Status: DC

## 2019-04-03 MED ORDER — BUPROPION HCL ER (XL) 150 MG PO TB24: 150.0000 mg | ORAL_TABLET | Freq: Every day | ORAL | 3 refills | Status: DC

## 2019-04-03 MED FILL — FLUTICASONE PROP 50 MCG SPR: 50 | 90 days supply | Qty: 48 | Fill #0

## 2019-04-03 MED FILL — RYBELSUS 3 MG TABS: 3 | 30 days supply | Qty: 30 | Fill #0

## 2019-04-03 MED FILL — PRAVASTATIN NA 40 MG TAB: 40 | 90 days supply | Qty: 90 | Fill #0

## 2019-04-03 MED FILL — buPROPion HCL ER (XL) 150 M: 150 | 90 days supply | Qty: 90 | Fill #0

## 2019-04-03 NOTE — Progress Notes (Signed)
  Dr Kem Parkinson is on Victoza, the lowest dose because she had stopped taking it for a few weeks.  If she switches to semaglutide oral does she start the 3 mg dose or at the 7 mg dose?

## 2019-04-04 NOTE — Progress Notes (Signed)
Thank you!  I have read that the 30 mg dose does not provide glucose lowering and that it is required for 30 days.  She has glipizide at home and I could get her to take that until I can go up to the 7 mg dose.  Any other suggestions?

## 2019-04-04 NOTE — Progress Notes (Signed)
Hi Dr. Lynnae January, I would recommend to start with the 3 mg dose. Thank you.

## 2019-04-05 ENCOUNTER — Telehealth: Payer: Self-pay | Admitting: Pharmacist

## 2019-04-05 MED ORDER — SEMAGLUTIDE 3 MG PO TABS: 3.0000 mg | ORAL_TABLET | Freq: Every day | ORAL | 0 refills | Status: DC

## 2019-04-05 NOTE — Progress Notes (Signed)
Patient called to review information on Rybelsus. Reviewed Rybelsus with the patient, including name, instructions, indication, goals of therapy, potential side effects, importance of adherence, and safe use.  Patient verbalized understanding by repeating back information and was advised to contact me if medication-related questions arise. Patient was also provided an information handout.

## 2019-04-05 NOTE — Addendum Note (Signed)
Addended by: Larey Dresser A on: 04/05/2019 11:52 AM   Modules accepted: Orders

## 2019-04-06 ENCOUNTER — Other Ambulatory Visit: Payer: Self-pay | Admitting: Internal Medicine

## 2019-04-06 MED ORDER — GLIPIZIDE 5 MG PO TABS: 5.0000 mg | ORAL_TABLET | Freq: Every day | ORAL | 0 refills | Status: DC

## 2019-04-06 NOTE — Progress Notes (Signed)
Hi Dr. Lynnae January, that's a great question, the 3 mg of Rybelsus is expected to lower A1C around 0.6-0.8%, but since it takes 1 month to titrate, would be reasonable to add glipizide in the meantime.

## 2019-04-07 NOTE — Progress Notes (Signed)
thanks

## 2019-04-08 ENCOUNTER — Other Ambulatory Visit: Payer: Self-pay | Admitting: Internal Medicine

## 2019-04-08 MED ORDER — FLUCONAZOLE 150 MG PO TABS: ORAL_TABLET | ORAL | 0 refills | Status: DC

## 2019-04-08 NOTE — Progress Notes (Signed)
difl

## 2019-04-11 MED FILL — glipiZIDE 5 MG TABS: 5 | 90 days supply | Qty: 90 | Fill #0

## 2019-04-12 MED FILL — PANTOPRAZOLE SOD DR 40 MG T: 40 | 90 days supply | Qty: 90 | Fill #0

## 2019-04-17 MED FILL — LOSARTAN-HCTZ 100-25 MG TAB: 100-25 | 90 days supply | Qty: 90 | Fill #2

## 2019-05-02 DIAGNOSIS — G4733 Obstructive sleep apnea (adult) (pediatric): Secondary | ICD-10-CM | POA: Diagnosis not present

## 2019-05-03 ENCOUNTER — Encounter: Payer: 59 | Admitting: Internal Medicine

## 2019-05-04 MED FILL — JANUVIA 100 MG TABLET: 100 | 90 days supply | Qty: 90 | Fill #1

## 2019-05-10 ENCOUNTER — Telehealth: Payer: Self-pay | Admitting: Pharmacist

## 2019-05-10 DIAGNOSIS — E1169 Type 2 diabetes mellitus with other specified complication: Secondary | ICD-10-CM

## 2019-05-10 MED ORDER — RYBELSUS 3 MG PO TABS: 1.0000 | ORAL_TABLET | Freq: Every day | ORAL | 0 refills | Status: DC

## 2019-05-10 MED ORDER — SEMAGLUTIDE 7 MG PO TABS: 1.0000 | ORAL_TABLET | Freq: Every day | ORAL | 0 refills | Status: DC

## 2019-05-10 NOTE — Progress Notes (Signed)
Patient requests a refill on Rybelsus and discussion of therapy plan. She reports overall tolerating treatment well since starting 1 month ago, but had an episode of constipation last week which was painful. She requested to remain on 3 mg dose of Rybelsus for now to ensure tolerability. States home BG results are reasonable (last reading was 173 after a meal, admits to needing to monitor more frequently/first thing in the morning). Will notify PCP of findings and plan to follow up with patient in about 1 month.

## 2019-05-11 MED FILL — RYBELSUS 3 MG TABS: 3 | 30 days supply | Qty: 30 | Fill #0

## 2019-05-11 NOTE — Telephone Encounter (Signed)
Thank you. I agree to cont 3mg  for now.

## 2019-05-18 MED FILL — FLUARIX QUADRIVALENT 0.5 ML: 0.5 | 1 days supply | Qty: 1 | Fill #0

## 2019-05-21 MED FILL — ESCITALOPRAM 10 MG TABLET: 10 | 90 days supply | Qty: 180 | Fill #2

## 2019-05-28 ENCOUNTER — Ambulatory Visit: Payer: 59

## 2019-05-30 ENCOUNTER — Other Ambulatory Visit: Payer: Self-pay | Admitting: Internal Medicine

## 2019-05-30 DIAGNOSIS — Z1231 Encounter for screening mammogram for malignant neoplasm of breast: Secondary | ICD-10-CM

## 2019-06-01 DIAGNOSIS — G4733 Obstructive sleep apnea (adult) (pediatric): Secondary | ICD-10-CM | POA: Diagnosis not present

## 2019-06-11 ENCOUNTER — Other Ambulatory Visit: Payer: Self-pay | Admitting: Internal Medicine

## 2019-06-11 ENCOUNTER — Ambulatory Visit (HOSPITAL_COMMUNITY)
Admission: RE | Admit: 2019-06-11 | Discharge: 2019-06-11 | Disposition: A | Discharge status: Home / Self Care | Payer: 59 | Source: Ambulatory Visit | Attending: Internal Medicine | Admitting: Internal Medicine

## 2019-06-11 DIAGNOSIS — R079 Chest pain, unspecified: Secondary | ICD-10-CM | POA: Insufficient documentation

## 2019-06-11 DIAGNOSIS — M546 Pain in thoracic spine: Secondary | ICD-10-CM

## 2019-06-11 DIAGNOSIS — K219 Gastro-esophageal reflux disease without esophagitis: Secondary | ICD-10-CM

## 2019-06-11 MED ORDER — GI COCKTAIL ~~LOC~~: 30.0000 mL | Freq: Two times a day (BID) | ORAL | 1 refills | Status: DC | PRN

## 2019-06-11 MED ORDER — LIDOCAINE 5 % EX PTCH: 1.0000 | MEDICATED_PATCH | CUTANEOUS | 0 refills | Status: DC

## 2019-06-11 MED ORDER — LIDOCAINE VISCOUS HCL 2 % MT SOLN: 15.0000 mL | Freq: Once | OROMUCOSAL | Status: DC

## 2019-06-11 MED ORDER — ALUM & MAG HYDROXIDE-SIMETH 200-200-20 MG/5ML PO SUSP: 30.0000 mL | Freq: Once | ORAL | Status: DC

## 2019-06-11 MED FILL — LIDOCAINE PATCH 5%: 5 | 15 days supply | Qty: 30 | Fill #0

## 2019-06-11 MED FILL — GI COCKTAIL (HOSPITAL FORM): 3 days supply | Qty: 180 | Fill #0

## 2019-06-11 NOTE — Addendum Note (Signed)
Addended by: Jodean Lima on: 06/11/2019 11:58 AM   Modules accepted: Orders

## 2019-06-11 NOTE — Progress Notes (Addendum)
Patient complaining of uncontrolled indigestion and left lateral chest / flank pain. She reports symptoms of frequent belching despite taking her PPI. She also has pain on her left lateral chest wall / flank area that starts in her left upper back region and wraps around to the front. Pain is dull in nature, worse with deep inspiration. No CVA tenderness. She denies dysuria. No cough or fever. Denies SOB. No constipation. Pain is not reproducible on exam. She is already taking daily NSAIDs and has ben trying heating pads without relief. She has a PRN muscle relaxer at home but has not yet tried this. Pain is likely MSK but will obtain EKG to rule out atypical ASC.   Sent prescription to pharmacy for topical lidocaine patches PRN and GI cocktail BID prn. She will follow up if symptoms do not improve. Will make PCP aware.   ADDENDUM: EKG non ischemic, normal sinus rhythm. Ordered GI cocktail + lidocaine to be given in clinic x 1.

## 2019-06-11 NOTE — Addendum Note (Signed)
Addended by: Jodean Lima on: 06/11/2019 11:43 AM   Modules accepted: Orders

## 2019-06-13 ENCOUNTER — Ambulatory Visit (INDEPENDENT_AMBULATORY_CARE_PROVIDER_SITE_OTHER): Payer: 59 | Admitting: Internal Medicine

## 2019-06-13 DIAGNOSIS — R0602 Shortness of breath: Secondary | ICD-10-CM

## 2019-06-13 DIAGNOSIS — R109 Unspecified abdominal pain: Secondary | ICD-10-CM | POA: Diagnosis not present

## 2019-06-13 DIAGNOSIS — R079 Chest pain, unspecified: Secondary | ICD-10-CM

## 2019-06-13 DIAGNOSIS — M546 Pain in thoracic spine: Secondary | ICD-10-CM | POA: Diagnosis not present

## 2019-06-13 NOTE — Progress Notes (Signed)
   Subjective:    Patient ID: Denise Stevens, female    DOB: 06/29/1966, 53 y.o.   MRN: [id number]  HPI  Denise Stevens is here for abd pain. Please see the A&P for the status of the pt's chronic medical problems.  ROS : per ROS section and in problem oriented charting. All other systems are negative.  PMHx, Soc hx, and / or Fam hx : Works in [employer] office. PMHx inc DM   Review of Systems  Constitutional: Negative for fever.  Respiratory: Positive for shortness of breath. Negative for cough.   Cardiovascular: Positive for chest pain.  Gastrointestinal: Positive for abdominal pain. Negative for nausea and vomiting.  Musculoskeletal: Positive for back pain.  Skin: Negative for color change and rash.  Neurological: Negative for numbness.       Objective:   Physical Exam Constitutional:      General: She is not in acute distress.    Appearance: She is obese. She is not ill-appearing, toxic-appearing or diaphoretic.  HENT:     Head: Normocephalic and atraumatic.     Right Ear: External ear normal.     Left Ear: External ear normal.  Eyes:     General: No scleral icterus.       Right eye: No discharge.        Left eye: No discharge.     Extraocular Movements: Extraocular movements intact.     Conjunctiva/sclera: Conjunctivae normal.  Cardiovascular:     Rate and Rhythm: Normal rate and regular rhythm.  Pulmonary:     Effort: Pulmonary effort is normal.     Breath sounds: Normal breath sounds.  Skin:    General: Skin is warm and dry.  Neurological:     General: No focal deficit present.     Mental Status: She is alert. Mental status is at baseline.  Psychiatric:        Mood and Affect: Mood normal.        Behavior: Behavior normal.        Thought Content: Thought content normal.        Judgment: Judgment normal.           Assessment & Plan:

## 2019-06-15 DIAGNOSIS — M546 Pain in thoracic spine: Secondary | ICD-10-CM | POA: Insufficient documentation

## 2019-06-15 NOTE — Assessment & Plan Note (Signed)
This is a new problem.  Denise Stevens is having a flare of pain on the left mid back and the left mid axillary line.  It is exacerbated by moving or deep breathing.  It is sharp in nature.  It is only partially relieved by a Lidoderm patch, muscle relaxer, and nonsteroidals.  It originally started a week ago with indigestion, burping, chest pain, and shoulder pain.  All of those symptoms have resolved with a combination of stopping her oral semaglutide and getting a GI cocktail.  She had a similar flare of this pain about a year ago and was seen in the ED and diagnosed with costochondritis and pleurisy.  At one point, she got a prednisone course and initially got significant relief but only for about 24 to 48 hours at which point the pain returned.  There are 2 areas of point tenderness.  1 is the mid scapular line on the left posterior back.  The other is midaxillary line.  We discussed trigger point injection and Dr. Heber [place] was set to do it but the subsequent day, all of her pain had resolved.  PLAN : Follow

## 2019-06-21 ENCOUNTER — Other Ambulatory Visit: Payer: Self-pay | Admitting: Internal Medicine

## 2019-06-21 DIAGNOSIS — R1032 Left lower quadrant pain: Secondary | ICD-10-CM | POA: Diagnosis not present

## 2019-06-21 DIAGNOSIS — K582 Mixed irritable bowel syndrome: Secondary | ICD-10-CM | POA: Diagnosis not present

## 2019-06-21 DIAGNOSIS — R109 Unspecified abdominal pain: Secondary | ICD-10-CM

## 2019-06-21 DIAGNOSIS — K219 Gastro-esophageal reflux disease without esophagitis: Secondary | ICD-10-CM | POA: Diagnosis not present

## 2019-06-28 MED FILL — GI COCKTAIL (HOSPITAL FORM): 3 days supply | Qty: 180 | Fill #1

## 2019-06-29 ENCOUNTER — Ambulatory Visit
Admission: RE | Admit: 2019-06-29 | Discharge: 2019-06-29 | Disposition: A | Discharge status: Home / Self Care | Payer: 59 | Source: Ambulatory Visit | Attending: Internal Medicine | Admitting: Internal Medicine

## 2019-06-29 ENCOUNTER — Other Ambulatory Visit: Payer: Self-pay

## 2019-06-29 DIAGNOSIS — Z1231 Encounter for screening mammogram for malignant neoplasm of breast: Secondary | ICD-10-CM | POA: Diagnosis not present

## 2019-07-04 ENCOUNTER — Other Ambulatory Visit: Payer: Self-pay | Admitting: Internal Medicine

## 2019-07-04 MED ORDER — PANTOPRAZOLE SODIUM 40 MG PO TBEC: 40.0000 mg | DELAYED_RELEASE_TABLET | Freq: Two times a day (BID) | ORAL | 3 refills | Status: DC

## 2019-08-14 ENCOUNTER — Other Ambulatory Visit: Payer: Self-pay | Admitting: Internal Medicine

## 2019-08-14 MED FILL — LOSARTAN-HCTZ 100-25 MG TAB: 100-25 | 90 days supply | Qty: 90 | Fill #3

## 2019-08-15 ENCOUNTER — Other Ambulatory Visit: Payer: Self-pay | Admitting: Internal Medicine

## 2019-08-15 MED ORDER — FREESTYLE TEST VI STRP: ORAL_STRIP | 12 refills | Status: AC

## 2019-08-15 MED ORDER — FREESTYLE SYSTEM KIT: 1.0000 | PACK | 0 refills | Status: DC | PRN

## 2019-08-15 MED ORDER — FREESTYLE LANCETS MISC: 12 refills | Status: DC

## 2019-08-15 MED FILL — FREESTYLE LITE TEST STRIP: 90 days supply | Qty: 100 | Fill #0

## 2019-08-15 MED FILL — FREESTYLE LANCETS: 90 days supply | Qty: 100 | Fill #0

## 2019-08-15 MED FILL — glipiZIDE 5 MG TABS: 5 | 90 days supply | Qty: 90 | Fill #0

## 2019-08-15 MED FILL — FREESTYLE LITE METER: 30 days supply | Qty: 1 | Fill #0

## 2019-08-16 ENCOUNTER — Other Ambulatory Visit: Payer: Self-pay | Admitting: Internal Medicine

## 2019-08-16 DIAGNOSIS — E119 Type 2 diabetes mellitus without complications: Secondary | ICD-10-CM

## 2019-08-16 MED ORDER — VICTOZA 18 MG/3ML ~~LOC~~ SOPN: 1.2000 mg | PEN_INJECTOR | Freq: Every day | SUBCUTANEOUS | 3 refills | Status: DC

## 2019-08-16 MED FILL — VICTOZA 2-PAK 18 MG/3 ML PE: 18 | 90 days supply | Qty: 18 | Fill #0

## 2019-08-20 MED FILL — JANUVIA 100 MG TABLET: 100 | 60 days supply | Qty: 60 | Fill #2

## 2019-08-22 ENCOUNTER — Other Ambulatory Visit: Payer: Self-pay | Admitting: Internal Medicine

## 2019-08-22 DIAGNOSIS — R519 Headache, unspecified: Secondary | ICD-10-CM

## 2019-08-22 NOTE — Progress Notes (Signed)
Denise Stevens talked to me about pulsatile  tinnitus.  Present for months but now worsening and keeping her awake at night.  Bilateral.  Hearing is normal.  There is associated left-sided headache and left ear fullness.  No carotid bruits.  We discussed need for CTA head and she agreed.

## 2019-08-23 ENCOUNTER — Other Ambulatory Visit (INDEPENDENT_AMBULATORY_CARE_PROVIDER_SITE_OTHER): Payer: 59

## 2019-08-23 ENCOUNTER — Other Ambulatory Visit: Payer: Self-pay | Admitting: Internal Medicine

## 2019-08-23 DIAGNOSIS — I10 Essential (primary) hypertension: Secondary | ICD-10-CM | POA: Diagnosis not present

## 2019-08-23 LAB — POCT GLYCOSYLATED HEMOGLOBIN (HGB A1C): Hemoglobin A1C: 11.5 % — AB (ref 4.0–5.6)

## 2019-08-23 LAB — GLUCOSE, CAPILLARY: Glucose-Capillary: 201 mg/dL — ABNORMAL HIGH (ref 70–99)

## 2019-08-24 ENCOUNTER — Other Ambulatory Visit: Payer: Self-pay | Admitting: Internal Medicine

## 2019-08-24 DIAGNOSIS — M5412 Radiculopathy, cervical region: Secondary | ICD-10-CM

## 2019-08-24 LAB — BMP8+ANION GAP
Anion Gap: 17 mmol/L (ref 10.0–18.0)
BUN/Creatinine Ratio: 10 (ref 9–23)
BUN: 9 mg/dL (ref 6–24)
CO2: 25 mmol/L (ref 20–29)
Calcium: 9.6 mg/dL (ref 8.7–10.2)
Chloride: 95 mmol/L — ABNORMAL LOW (ref 96–106)
Creatinine, Ser: 0.94 mg/dL (ref 0.57–1.00)
GFR calc Af Amer: 79 mL/min/{1.73_m2} (ref >59)
GFR calc non Af Amer: 69 mL/min/{1.73_m2} (ref >59)
Glucose: 212 mg/dL — ABNORMAL HIGH (ref 65–99)
Potassium: 4 mmol/L (ref 3.5–5.2)
Sodium: 137 mmol/L (ref 134–144)

## 2019-08-24 LAB — VITAMIN B12: Vitamin B-12: 469 pg/mL (ref 232–1245)

## 2019-08-24 NOTE — Progress Notes (Signed)
Denise Stevens is having similar symptoms as she did in 2017 of neck and arm pain and tingling.    2-3 weeks of symptoms but they are worsening. Taking ibuprofen, tylenol, & skelaxin more than once daily along with gabapentin QOD (not QD 2/2 side effects) and not controlling pain but does ease it off. No weakness but numbness and tingling from L elbow to fingers, #2 -5. Turning neck makes it worse - and pain will also be in pec  The symptoms are more severe than they were in 2017.  She responded to a steroid injection in 2017.  Due to the length of time since her last cervical MRI, we will repeat imaging and refer back to IR.

## 2019-08-27 ENCOUNTER — Ambulatory Visit (HOSPITAL_COMMUNITY)
Admission: RE | Admit: 2019-08-27 | Discharge: 2019-08-27 | Disposition: A | Discharge status: Home / Self Care | Payer: 59 | Source: Ambulatory Visit | Attending: Internal Medicine | Admitting: Internal Medicine

## 2019-08-27 ENCOUNTER — Other Ambulatory Visit: Payer: Self-pay

## 2019-08-27 ENCOUNTER — Encounter (HOSPITAL_COMMUNITY): Payer: Self-pay

## 2019-08-27 DIAGNOSIS — R519 Headache, unspecified: Secondary | ICD-10-CM | POA: Insufficient documentation

## 2019-08-27 MED ORDER — IOHEXOL 350 MG/ML SOLN
50.0000 mL | Freq: Once | INTRAVENOUS | Status: AC | PRN
  Administered 2019-08-27: 50 mL via INTRAVENOUS

## 2019-08-31 DIAGNOSIS — G4733 Obstructive sleep apnea (adult) (pediatric): Secondary | ICD-10-CM | POA: Diagnosis not present

## 2019-09-03 NOTE — Progress Notes (Signed)
Spoke with the pt. Unable to sch appt at this time. Per the patient she is requesting to hold off on sch this MRI sch until after her next appt with you on 09/13/2019.

## 2019-09-05 ENCOUNTER — Other Ambulatory Visit: Payer: Self-pay | Admitting: Internal Medicine

## 2019-09-05 MED ORDER — AMOXICILLIN 875 MG PO TABS: 875.0000 mg | ORAL_TABLET | Freq: Two times a day (BID) | ORAL | 0 refills | Status: DC

## 2019-09-05 MED ORDER — FLUCONAZOLE 150 MG PO TABS: ORAL_TABLET | ORAL | 0 refills | Status: DC

## 2019-09-05 MED FILL — AMOXICILLIN 875 MG TABS: 875 | 5 days supply | Qty: 10 | Fill #0

## 2019-09-05 MED FILL — FLUCONAZOLE 150 MG TABS: 150 | 12 days supply | Qty: 4 | Fill #0

## 2019-09-05 NOTE — Progress Notes (Signed)
1 week of right ear pain.  Worse from cold care itself.  Tender preauricular.  Tender right anterior lymph node.  Husband concerned about TMJ but no pain on the left side.  On exam, erythematous ear canal and TM.  No tympanic membrane perforation.  No fluid.  If viral, should be getting better.  Empiric trial of Amoxil 875 twice daily for 5 days.

## 2019-09-10 ENCOUNTER — Other Ambulatory Visit: Payer: Self-pay | Admitting: Internal Medicine

## 2019-09-10 MED FILL — ESCITALOPRAM 10 MG TABLET: 10 | 90 days supply | Qty: 180 | Fill #0

## 2019-09-10 MED FILL — JANUVIA 100 MG TABLET: 100 | 60 days supply | Qty: 60 | Fill #2

## 2019-09-10 MED FILL — buPROPion HCL ER (XL) 150 M: 150 | 90 days supply | Qty: 90 | Fill #1

## 2019-09-13 ENCOUNTER — Encounter: Payer: 59 | Admitting: Internal Medicine

## 2019-09-13 DIAGNOSIS — F458 Other somatoform disorders: Secondary | ICD-10-CM | POA: Diagnosis not present

## 2019-09-13 DIAGNOSIS — G501 Atypical facial pain: Secondary | ICD-10-CM | POA: Diagnosis not present

## 2019-09-13 DIAGNOSIS — M2653 Deviation in opening and closing of the mandible: Secondary | ICD-10-CM | POA: Diagnosis not present

## 2019-09-13 DIAGNOSIS — M26603 Bilateral temporomandibular joint disorder, unspecified: Secondary | ICD-10-CM | POA: Diagnosis not present

## 2019-09-13 MED FILL — CYCLOBENZAPRINE 10 MG TAB: 10 | 30 days supply | Qty: 30 | Fill #0

## 2019-09-19 ENCOUNTER — Emergency Department (HOSPITAL_COMMUNITY)
Admission: EM | Admit: 2019-09-19 | Discharge: 2019-09-19 | Disposition: A | Discharge status: Home / Self Care | Payer: 59 | Attending: Emergency Medicine | Admitting: Emergency Medicine

## 2019-09-19 ENCOUNTER — Encounter (HOSPITAL_COMMUNITY): Payer: Self-pay | Admitting: Emergency Medicine

## 2019-09-19 ENCOUNTER — Emergency Department (HOSPITAL_BASED_OUTPATIENT_CLINIC_OR_DEPARTMENT_OTHER): Payer: 59

## 2019-09-19 DIAGNOSIS — I10 Essential (primary) hypertension: Secondary | ICD-10-CM | POA: Insufficient documentation

## 2019-09-19 DIAGNOSIS — E119 Type 2 diabetes mellitus without complications: Secondary | ICD-10-CM | POA: Insufficient documentation

## 2019-09-19 DIAGNOSIS — R52 Pain, unspecified: Secondary | ICD-10-CM | POA: Diagnosis not present

## 2019-09-19 DIAGNOSIS — J45909 Unspecified asthma, uncomplicated: Secondary | ICD-10-CM | POA: Insufficient documentation

## 2019-09-19 DIAGNOSIS — Z79899 Other long term (current) drug therapy: Secondary | ICD-10-CM | POA: Diagnosis not present

## 2019-09-19 DIAGNOSIS — M5432 Sciatica, left side: Secondary | ICD-10-CM | POA: Diagnosis not present

## 2019-09-19 DIAGNOSIS — M545 Low back pain: Secondary | ICD-10-CM | POA: Diagnosis present

## 2019-09-19 DIAGNOSIS — M79605 Pain in left leg: Secondary | ICD-10-CM | POA: Insufficient documentation

## 2019-09-19 DIAGNOSIS — M5442 Lumbago with sciatica, left side: Secondary | ICD-10-CM | POA: Diagnosis not present

## 2019-09-19 MED ORDER — KETOROLAC TROMETHAMINE 15 MG/ML IJ SOLN
15.0000 mg | Freq: Once | INTRAMUSCULAR | Status: AC
  Administered 2019-09-19: 09:00:00 15 mg via INTRAMUSCULAR
  Filled 2019-09-19: qty 1

## 2019-09-19 MED ORDER — METHOCARBAMOL 500 MG PO TABS: 500.0000 mg | ORAL_TABLET | Freq: Two times a day (BID) | ORAL | 0 refills | Status: DC

## 2019-09-19 MED ORDER — HYDROCODONE-ACETAMINOPHEN 5-325 MG PO TABS: 1.0000 | ORAL_TABLET | Freq: Four times a day (QID) | ORAL | 0 refills | Status: DC | PRN

## 2019-09-19 MED ORDER — FENTANYL CITRATE (PF) 100 MCG/2ML IJ SOLN
100.0000 ug | Freq: Once | INTRAMUSCULAR | Status: AC
  Administered 2019-09-19: 09:00:00 100 ug via INTRAMUSCULAR
  Filled 2019-09-19: qty 2

## 2019-09-19 MED ORDER — METHOCARBAMOL 500 MG PO TABS
1000.0000 mg | ORAL_TABLET | Freq: Once | ORAL | Status: AC
  Administered 2019-09-19: 1000 mg via ORAL
  Filled 2019-09-19: qty 2

## 2019-09-19 MED ORDER — PREDNISONE 20 MG PO TABS: 40.0000 mg | ORAL_TABLET | Freq: Every day | ORAL | 0 refills | Status: AC

## 2019-09-19 MED FILL — METHOCARBAMOL 500 MG TABS: 500 | 10 days supply | Qty: 20 | Fill #0

## 2019-09-19 MED FILL — predniSONE 20 MG TABS: 20 | 4 days supply | Qty: 8 | Fill #0

## 2019-09-19 NOTE — Discharge Instructions (Addendum)
Take prednisone as prescribed. Closely monitor your blood sugar levels. Do not take other anti-inflammatories at the same time (naproxen, Advil, Motrin, ibuprofen, Aleve).  Continue taking Tylenol as you have been. Use norco as needed for severe or breakthrough pain. Have caution, this may make you tired or groggy. Do not drive or operate heavy machinery while taking this medicine.  Use robaxin as needed for muscle stiffness or soreness.  Use muscle creams (bengay, icy hot, salonpas) as needed for pain.  Do the stretches 2 times a day for back pain.  Use heat/ice to help with pain control.  Follow up with your neurosurgeon for further management of your pain.   Return to the ER if you develop high fevers, numbness, loss of bowel or bladder control, chest pain, difficulty breathing, or any new or concerning symptoms.

## 2019-09-19 NOTE — Progress Notes (Signed)
Bilateral lower extremity venous duplex exam performed.  Preliminary results can be found under CV proc under chart review.  09/19/2019 11:39 AM  Katharina Jehle, K., RDMS, RVT

## 2019-09-19 NOTE — ED Provider Notes (Addendum)
[facility] EMERGENCY DEPARTMENT Provider Note   CSN: [id number] Arrival date & time: 09/19/19  0710     History Chief Complaint  Patient presents with  . Hip Pain    Denise Stevens is a 54 y.o. female presenting for evaluation of left low back and leg pain.  Patient states 2 days ago she developed severe pain in her low back, buttock, and leg.  Pain is constant and severe, she describes as a shooting pain.  Initially, pain began in bilateral calves, however she has no pain of her upper right leg.  She denies fall, trauma, or injury.  She reports history of issues with her lumbar spine (MRI in 2017 shows L4-L5 and L5-S1 degenerative changes causing stenosis and mild central canal narrowing with mild encroachment upon left S1 nerve root).  She denies fevers, chills, nausea, vomiting, abd pain, urinary symptoms, loss of bowel bladder control, numbness, tingling, history of cancer, history of IVDU.  She denies recent travel, surgeries, immobilization, history of previous DVT/PE, hormone use.  She has a history of diabetes, she has been on prednisone before with mild fluctuation of blood sugar but patient able to manage at home with close monitoring. She has been taking Aleve and Tylenol with mild improvement of her symptoms, she has not had any today.  She sees Dr. Micheline Chapman with orthopedics, Dr. Ronnald Ramp with neurosurgery, Dr. Lavell Anchors with neurology.  Additional history obtained from chart review.  Reviewed MRI report from 2017.  Patient with a history of depression, diabetes, GERD, hypertension, hyperlipidemia, obesity, OSA, unequal leg length.  HPI     Past Medical History:  Diagnosis Date  . Asthma    no intubation or hospitalization hx  . Depression   . Diabetes mellitus   . GERD (gastroesophageal reflux disease)   . HTN (hypertension)   . Hyperlipidemia     Patient Active Problem List   Diagnosis Date Noted  . Thoracic back pain 06/15/2019  . Thyroid nodule  06/26/2016  . Cervical disc disorder with radiculopathy of cervical region 04/01/2016  . Hyperlipidemia 06/05/2015  . GERD (gastroesophageal reflux disease) 06/05/2015  . Allergic rhinitis 06/05/2015  . Keratoconjunctivitis sicca ([place]) 06/05/2015  . Health care maintenance 06/05/2015  . Morbid obesity ([place]) 03/31/2015  . Essential hypertension 08/06/2014  . Dysphoric mood 08/06/2014  . Obstructive sleep apnea 01/21/2014  . Type 2 diabetes mellitus with other specified complication ([place]) 86/57/8469  . UNEQUAL LEG LENGTH 01/17/2009    Past Surgical History:  Procedure Laterality Date  . ABDOMINAL HYSTERECTOMY    . COLONOSCOPY WITH PROPOFOL N/A 04/29/2015   Procedure: COLONOSCOPY WITH PROPOFOL;  Surgeon: Juanita Craver, MD;  Location: [facility] ENDOSCOPY;  Service: Endoscopy;  Laterality: N/A;  . GANGLION CYST EXCISION     R wrist 2000  . MARSUPIALIZATION URETHRAL DIVERTICULUM     2012  . ROTATOR CUFF REPAIR     R 2000  . VESICOVAGINAL FISTULA CLOSURE W/ TAH     nov 07     OB History    Gravida  2   Para  2   Term      Preterm      AB      Living  2     SAB      TAB      Ectopic      Multiple      Live Births              Family History  Problem Relation  Age of Onset  . Cancer Father        prostate  . Hyperlipidemia Mother   . Hypertension Mother   . Kidney disease Mother   . Diabetes Mother   . Breast cancer Mother   . Obesity Sister        s/p bypass  . Heart attack Neg Hx     Social History   Tobacco Use  . Smoking status: Never Smoker  . Smokeless tobacco: Never Used  Substance Use Topics  . Alcohol use: Not Currently    Alcohol/week: 0.0 standard drinks  . Drug use: No    Home Medications Prior to Admission medications   Medication Sig Start Date End Date Taking? Authorizing Provider  acetic acid-hydrocortisone (VOSOL-HC) OTIC solution Place 4 drops into both ears 3 (three) times daily. Patient taking differently: Place 4 drops into both  ears 3 (three) times daily as needed (dermatitis).  09/20/17   Bartholomew Crews, MD  ALPRAZolam Duanne Moron) 0.25 MG tablet Take 1 tablet (0.25 mg total) by mouth 3 (three) times daily as needed for anxiety. 06/15/16   Sid Falcon, MD  Alum & Mag Hydroxide-Simeth (GI COCKTAIL) SUSP suspension Take 30 mLs by mouth 2 (two) times daily as needed for indigestion. Shake well. 06/11/19   Velna Ochs, MD  amoxicillin (AMOXIL) 875 MG tablet Take 1 tablet (875 mg total) by mouth 2 (two) times daily. 09/05/19   Bartholomew Crews, MD  Blood Glucose Monitoring Suppl (ACCU-CHEK GUIDE) w/Device KIT 1 each by Does not apply route 3 (three) times daily as needed. 01/11/19   Bartholomew Crews, MD  buPROPion (WELLBUTRIN XL) 150 MG 24 hr tablet Take 1 tablet (150 mg total) by mouth daily. 04/03/19   Bartholomew Crews, MD  cycloSPORINE (RESTASIS) 0.05 % ophthalmic emulsion Place 1 drop into both eyes at bedtime as needed (dry eyes).    [provider]  escitalopram (LEXAPRO) 10 MG tablet TAKE 2 TABLETS BY MOUTH DAILY. 09/10/19   Bartholomew Crews, MD  fluconazole (DIFLUCAN) 150 MG tablet Take 1 pill now and the second pill in 3 days 09/05/19   Bartholomew Crews, MD  fluticasone [facility]) 50 MCG/ACT nasal spray Place 1 spray into both nostrils 2 (two) times daily. 04/03/19   Bartholomew Crews, MD  gabapentin (NEURONTIN) 300 MG capsule Take one pill before bed for 3 days THEN one pill twice a day for 3 days THEN one pill three times a day 12/29/18   Bartholomew Crews, MD  glipiZIDE (GLUCOTROL) 5 MG tablet TAKE 1 TABLET (5 MG TOTAL) BY MOUTH DAILY BEFORE BREAKFAST. 08/15/19 08/14/20  Bartholomew Crews, MD  glucose blood (FREESTYLE TEST STRIPS) test strip Use as instructed 08/15/19   Bartholomew Crews, MD  glucose monitoring kit (FREESTYLE) monitoring kit 1 each by Does not apply route as needed for other. 08/15/19   Bartholomew Crews, MD  HYDROcodone-acetaminophen (NORCO/VICODIN) 5-325 MG tablet  Take 1 tablet by mouth every 6 (six) hours as needed for severe pain. 09/19/19   Everett Ehrler, PA-C  JANUVIA 100 MG tablet TAKE 1 TABLET (100 MG TOTAL) BY MOUTH DAILY. 01/11/19   Bartholomew Crews, MD  Lancets (FREESTYLE) lancets Use as instructed 08/15/19   Bartholomew Crews, MD  lidocaine (LIDODERM) 5 % Place 1-2 patches onto the skin daily. Remove & Discard patch within 12 hours or as directed by MD 06/11/19   Velna Ochs, MD  liraglutide (VICTOZA) 18 MG/3ML SOPN Inject 0.2  mLs (1.2 mg total) into the skin daily. 08/16/19   Bartholomew Crews, MD  losartan-hydrochlorothiazide (HYZAAR) 100-25 MG tablet Take 1 tablet by mouth daily. 12/26/18   Bartholomew Crews, MD  metaxalone (SKELAXIN) 800 MG tablet TAKE 1 TABLET (800 MG TOTAL) BY MOUTH 3 (THREE) TIMES DAILY AS NEEDED FOR MUSCLE SPASMS. 03/26/19   Bartholomew Crews, MD  methocarbamol (ROBAXIN) 500 MG tablet Take 1 tablet (500 mg total) by mouth 2 (two) times daily. 09/19/19   Eleah Lahaie, PA-C  pantoprazole (PROTONIX) 40 MG tablet Take 1 tablet (40 mg total) by mouth 2 (two) times daily before a meal. 07/04/19   Bartholomew Crews, MD  pravastatin (PRAVACHOL) 40 MG tablet Take 1 tablet (40 mg total) by mouth daily. 04/03/19   Bartholomew Crews, MD  predniSONE (DELTASONE) 20 MG tablet Take 2 tablets (40 mg total) by mouth daily for 4 days. 09/19/19 09/23/19  Almena Hokenson, PA-C  Semaglutide (RYBELSUS) 3 MG TABS Take 1 tablet by mouth daily. 05/10/19   Bartholomew Crews, MD  UNIFINE PENTIPS 31G X 5 MM MISC USE TO INJECT VICTOZA DAILY 08/08/18   Bartholomew Crews, MD    Allergies    Augmentin [amoxicillin-pot clavulanate], Codeine, Morphine and related, and Tramadol  Review of Systems   Review of Systems  Musculoskeletal: Positive for back pain and myalgias.  All other systems reviewed and are negative.   Physical Exam Updated Vital Signs BP 123/82   Pulse 94   Temp 98.2 F (36.8 C) (Oral)   Resp 18    SpO2 94%   Physical Exam Vitals and nursing note reviewed.  Constitutional:      General: She is not in acute distress.    Appearance: She is well-developed.     Comments: Obese female who appears uncomfortable due to pain, otherwise nontoxic  HENT:     Head: Normocephalic and atraumatic.  Eyes:     Conjunctiva/sclera: Conjunctivae normal.     Pupils: Pupils are equal, round, and reactive to light.  Cardiovascular:     Rate and Rhythm: Regular rhythm. Tachycardia present.     Pulses: Normal pulses.     Comments: Mildly tachycardic around 105 Pulmonary:     Effort: Pulmonary effort is normal. No respiratory distress.     Breath sounds: Normal breath sounds. No wheezing.  Abdominal:     General: There is no distension.     Palpations: Abdomen is soft. There is no mass.     Tenderness: There is no abdominal tenderness. There is no guarding or rebound.  Musculoskeletal:     Cervical back: Normal range of motion and neck supple.     Comments: Tenderness palpation of L/S spine and L buttock. No ttp of perispinal musculature. Mild discomfort with palpation of bilateral calves, no obvious swelling or erythema. Pedal pulses 2+ bilaterally. Good distal sensation and cap refill. No saddle paresthesias. Pt is ambulatory.   Skin:    General: Skin is warm and dry.     Capillary Refill: Capillary refill takes less than 2 seconds.  Neurological:     Mental Status: She is alert and oriented to person, place, and time.     ED Results / Procedures / Treatments   Labs (all labs ordered are listed, but only abnormal results are displayed) Labs Reviewed - No data to display  EKG None  Radiology VAS [place] LOWER EXTREMITY VENOUS (DVT) (ONLY [facility] & [facility] 7a-7p)  Result Date: 09/19/2019  Lower Venous DVTStudy  Indications: Pain.  Limitations: Body habitus. Comparison Study: Lower extremity venous duplex 04-02-18, negative. Performing Technologist: Baldwin Crown ARDMS, RVT  Examination Guidelines: A  complete evaluation includes B-mode imaging, spectral Doppler, color Doppler, and power Doppler as needed of all accessible portions of each vessel. Bilateral testing is considered an integral part of a complete examination. Limited examinations for reoccurring indications may be performed as noted. The reflux portion of the exam is performed with the patient in reverse Trendelenburg.  +---------+---------------+---------+-----------+----------+--------------+ RIGHT    CompressibilityPhasicitySpontaneityPropertiesThrombus Aging +---------+---------------+---------+-----------+----------+--------------+ CFV      Full           Yes      Yes                                 +---------+---------------+---------+-----------+----------+--------------+ SFJ      Full                                                        +---------+---------------+---------+-----------+----------+--------------+ FV Prox  Full                                                        +---------+---------------+---------+-----------+----------+--------------+ FV Mid   Full                                                        +---------+---------------+---------+-----------+----------+--------------+ FV DistalFull                                                        +---------+---------------+---------+-----------+----------+--------------+ PFV      Full                                                        +---------+---------------+---------+-----------+----------+--------------+ POP      Full           Yes      Yes                                 +---------+---------------+---------+-----------+----------+--------------+ PTV      Full                                                        +---------+---------------+---------+-----------+----------+--------------+ PERO     Full                                                         +---------+---------------+---------+-----------+----------+--------------+   +---------+---------------+---------+-----------+----------+--------------+  LEFT     CompressibilityPhasicitySpontaneityPropertiesThrombus Aging +---------+---------------+---------+-----------+----------+--------------+ CFV      Full           Yes      Yes                                 +---------+---------------+---------+-----------+----------+--------------+ SFJ      Full                                                        +---------+---------------+---------+-----------+----------+--------------+ FV Prox  Full                                                        +---------+---------------+---------+-----------+----------+--------------+ FV Mid   Full                                                        +---------+---------------+---------+-----------+----------+--------------+ FV DistalFull                                                        +---------+---------------+---------+-----------+----------+--------------+ PFV      Full                                                        +---------+---------------+---------+-----------+----------+--------------+ POP      Full           Yes      Yes                                 +---------+---------------+---------+-----------+----------+--------------+ PTV      Full                                                        +---------+---------------+---------+-----------+----------+--------------+ PERO     Full                                                        +---------+---------------+---------+-----------+----------+--------------+     Summary: BILATERAL: - No evidence of deep vein thrombosis seen in the lower extremities, bilaterally.  RIGHT: - No cystic structure found in the popliteal fossa.  LEFT: - No cystic structure found in the popliteal fossa.  *See table(s) above for measurements and  observations.    Preliminary     Procedures Procedures (including critical care time)  Medications Ordered in ED Medications  fentaNYL (SUBLIMAZE) injection 100 mcg (100 mcg Intramuscular Given 09/19/19 0832)  ketorolac (TORADOL) 15 MG/ML injection 15 mg (15 mg Intramuscular Given 09/19/19 0833)  methocarbamol (ROBAXIN) tablet 1,000 mg (1,000 mg Oral Given 09/19/19 1173)    ED Course  I have reviewed the triage vital signs and the nursing notes.  Pertinent labs & imaging results that were available during my care of the patient were reviewed by me and considered in my medical decision making (see chart for details).    MDM Rules/Calculators/A&P                      Patient presenting for evaluation of back and left leg pain.  Physical exam patient is uncomfortable with movement pain.  Otherwise nontoxic.  She is neurovascularly intact.  She is mildly tachycardic, likely secondary to pain.  Additionally, patient with bilateral calf discomfort.  No risk factors for DVT, however this remains on the differential due to bilateral calf pain. As patient is neurologically intact without focal weakness, will not obtain emergent MRI today.  Will treat for pain and reassess.  On reassessment, patient reports pain is much improved.  Heart rate has improved with pain control.  However patient continues to have calf pain.  As such, will obtain ultrasound.  Ultrasounds negative for DVT.  On reassessment, patient remains nontoxic in appearance.  No neurologic deficits.  Discussed continued pain control at home and follow-up with neurosurgery.  Discussed option of steroid, patient would like to try.  She states she will monitor closely and follow-up with her primary care doctor and her diabetes coordinator.  PMP checked, patient without concerning narcotic use.  At this time, patient appears safe for discharge.  Return precautions given.  Patient states she understands and agrees to plan.  Final Clinical  Impression(s) / ED Diagnoses Final diagnoses:  Sciatica of left side    Rx / DC Orders ED Discharge Orders         Ordered    HYDROcodone-acetaminophen (NORCO/VICODIN) 5-325 MG tablet  Every 6 hours PRN     09/19/19 1148    methocarbamol (ROBAXIN) 500 MG tablet  2 times daily     09/19/19 1148    predniSONE (DELTASONE) 20 MG tablet  Daily     09/19/19 1148           [place], Shauntae Reitman, PA-C 09/19/19 [place], Foxx Klarich, PA-C 09/19/19 1302    Isla Pence, MD 09/19/19 1352

## 2019-09-19 NOTE — ED Triage Notes (Signed)
Pt reports pain in the middle of her left gluteal that radiates down into her lower left leg. Pt states this states suddenly 2 days ago with no injury. Pt describes pain as burning.  Pt is currently on flexeril for TMJ last dose last pm. Pt has been using Tylenol and aleve for pain with some relief.

## 2019-09-20 ENCOUNTER — Telehealth: Payer: Self-pay | Admitting: Dietician

## 2019-09-20 NOTE — Telephone Encounter (Signed)
Steroid-Induced Hyperglycemia Prevention and Management Denise Stevens is a 54 y.o. female who meets criteria for [facility] glucose monitoring program (diabetes patient prescribed short course of steroids).  A/P Current Regimen  Patient prescribed prednisone 40 mg daily x 4 days, currently on day 0-1 of therapy. Patient taking prednisone in the AM  Prednisone indication: sciatica  Current DM regimen rybelsus 3mg  daily, glipizde 5mg  daily, januvia 100 mg daily, lirglutide 1.2 mg daily   Home BG Monitoring  Patient does have a meter at home and does check BG at home. Meter was not supplied. CBGs prior to steroid course unknown, A1C prior to steroid course  Lab Results  Component Value Date   HGBA1C 11.5 (A) 08/23/2019   CBGs today and yesterday: 216 and 366    S/Sx of hyper- or hypoglycemia: none, none  Medication Management  Switch prednisone dose to AM no  Additional treatment for BG control is indicated at this time.  Glipizide increase form 5 mg daily to 7.5 mg daily  Physician preference level per protocol: 1  Medication supply Patient will use own medication  Patient Education  Advised patient to monitor BG while on steroid therapy (at least twice daily prior to first 2 meals of the day).  Patient educated about signs/symptoms and advised to contact clinic if hyper- or hypoglycemic.  Patient did  verbalize understanding of information and regimen by repeating back topics discussed.  Follow-up tomorrow and monday  none scheduled  [name] 4:14 PM 09/20/2019

## 2019-09-21 NOTE — Telephone Encounter (Signed)
Copied from outlook into Epic:  Wow- Thank you for the update. You are doing a good job with checking and reporting your blood sugar and making healthy choices!   I think the steroid dose of 40 mg is what is raising your blood sugar. If you have another blood sugar >200 today, I suggest increasing the glipizide to 15 mg tomorrow morning before breakfast. I am not working tomorrow to advise about Sunday, but I think 10mg  Glipizide would still be fine to take on Sunday, then back to 5mg  on Monday. What do you think?   Butch Penny  From: Blanch [employer] [email or web address]>  Sent: Friday, September 21, 2019 1:50 PM To: Jovonda Selner, Butch Penny [email or web address]> Cc: Larey Dresser [email or web address]> Subject: Re: SECURE: left you a voicemail  Rayelynn Loyal  My blood sugar is 311 I'm getting ready to eat some lunch. I took 10 mg of glip this morning.  Breakfast consisted of Raisin Toast x2  2 boiled Eggs Sugar free lemonade (12ounces)?

## 2019-09-21 NOTE — Telephone Encounter (Addendum)
Prevention of steroid induced hyperglycemia follow up:   Call from Ms. Sahlin: . Her back is feeling better, but her toes are numb and do not feel right. She states she is going to call her doctor to discuss this.   Steroids:This is day 3 and tomorrow is her last day of steroids Blood sugar:  Her blood a sugar this am is 165 mg/dL. Her usual is ~ 120. She'll email her second blood sugar later today. Diabetes Medicine:Her current medicines are: Victoza 1.2 daily( tolerated 1.8 but has not increased it back there yet), not taking rybelsus because she did not tolerate it, januvia 100 mg daily , glipizide 5mg  daily, she did not tolerate metformin Hypoglycemia symptoms:Hyperglycemia symptoms: She has not had any signs or symptoms of low or high blood sugar. She states that blood sugars ~100mg /dl makes her nervous, lowest she ever remembers is ~70mg /dl. We discussed the goal of the protocol is to keep her blood sugar under 200 mg/dl.   Plan: advised her to take 10 mg glipizide today and tomorrow, 10mg  glipizide again on Sunday if her blood sugar on Saturday are > 200 mg/dl, 5mg  Glipizide on Sunday if blood sugars on Saturday < 200 mg/dl. Monday resume usual 5 mg glipizide.  Call Monday afternoon.   Debera Lat, RD 09/21/2019 9:37 AM.

## 2019-09-21 NOTE — Telephone Encounter (Signed)
Received emails form patient "Blood sugar at bed time 394. I took a 5mg  glipizide " Left a message to confirm that she took a second dose of 5mg  glipizide last night.   If so, then recommended she take 10 mg glipizide this am(day 3)  and tomorrow  day 4 and her final day), then possibly back to her usual vs continue 10 mg glipizide one more day.

## 2019-09-24 ENCOUNTER — Other Ambulatory Visit: Payer: Self-pay | Admitting: Internal Medicine

## 2019-09-24 DIAGNOSIS — M5417 Radiculopathy, lumbosacral region: Secondary | ICD-10-CM

## 2019-09-24 MED ORDER — HYDROCODONE-ACETAMINOPHEN 5-325 MG PO TABS: 1.0000 | ORAL_TABLET | Freq: Four times a day (QID) | ORAL | 0 refills | Status: DC | PRN

## 2019-09-24 MED FILL — HYDROCODON-APAP 5-325: 5-325 | 4 days supply | Qty: 15 | Fill #0

## 2019-09-24 NOTE — Progress Notes (Signed)
Denise Stevens has had 1 week of left radiculopathy.  Posterior left calf is the worst pain but also has radicular pain from the lower back into the buttock and down the leg.  She is on maximum medical therapy including nonsteroidal, muscle relaxer, hydrocodone or tramadol, and completed a course of steroid.  Symptoms are not improving and she now has weakness of the left ankle dorsiflexion.  I spoke to radiology who will consider steroid injection with only a week of symptoms and with muscle weakness.  Ordering the MRI which needs to be done as an open MRI and I am refilling hydrocodone which she takes with Benadryl to decrease itching.

## 2019-09-24 NOTE — Addendum Note (Signed)
Addended by: Larey Dresser A on: 09/24/2019 03:35 PM   Modules accepted: Orders

## 2019-09-24 NOTE — Progress Notes (Signed)
[facility] not option so I put external

## 2019-09-24 NOTE — Progress Notes (Signed)
F/u [facility] on [facility] in [place] will be able to schedule patient this week.  Per Sch request please change the Imaging site to the following:  [address] [place], [place] [address] Phone: [phone] (option 5)  Orders will be faxed to Scheduling fax: [phone]

## 2019-09-24 NOTE — Telephone Encounter (Signed)
Email exchanges from today:  Hi Denise Stevens,  Disregard the previous email. I missed this one.  First, good job self-managing your diabetes by increasing the glipizide to 20 mg and staying on top of checking your blood sugars during the prednisone course! Will you go back to taking the 5 mg glipizide today and from now on?  I would recommend continuing to check blood sugars as you have been to collect information about how well your diabetes medicines are working during your usual routine.  With your previous A1C level being higher than target, I wonder if you may need a different diabetes medicine at this point?   I am happy to continue to follow if you'd like.   Denise Stevens  From: Denise Stevens [email or web address]>  Sent: Sunday, September 23, 2019 5:30 PM To: Denise Stevens, Denise Stevens [email or web address]> Cc: Denise Stevens [email or web address]> Subject: Re: SECURE: left you a voicemail  No breakfast today as I slept late only 2 meals. Finished prednisone yesterday. Blood sugar today  11:30 148 5:30 129  both taken before meals.   Back to my normal routine tomorrow.   Thanks for your help Denise Stevens and Denise Stevens     From: Denise Stevens [email or web address]> Sent: Saturday, September 22, 2019 5:43:53 PM To: Denise Stevens, Denise Stevens [email or web address]> Cc: Denise Stevens @[email or web address]> Subject: Re: SECURE: left you a voicemail    Today Saturday 2/13 My blood sugar was 165 before breakfast.  I took 15 mg of glip My blood sugar before supper was 332 because I didn't eat lunch.   Tomorrow Sunday 2/14 I will take 20 mg of glip. I will send my blood sugars   Thanks Denise Stevens

## 2019-09-25 ENCOUNTER — Other Ambulatory Visit: Payer: Self-pay | Admitting: Internal Medicine

## 2019-09-25 DIAGNOSIS — M5116 Intervertebral disc disorders with radiculopathy, lumbar region: Secondary | ICD-10-CM | POA: Diagnosis not present

## 2019-09-25 DIAGNOSIS — M4726 Other spondylosis with radiculopathy, lumbar region: Secondary | ICD-10-CM | POA: Diagnosis not present

## 2019-09-25 DIAGNOSIS — M5416 Radiculopathy, lumbar region: Secondary | ICD-10-CM

## 2019-09-25 NOTE — Progress Notes (Signed)
Spoke with [facility] @ [facility]. The IR order has now been rec'd and the patient will be contacted and sch after her MRI appointment with [facility].  The patient has also been contacted and informed of this information.

## 2019-09-25 NOTE — Progress Notes (Signed)
Orders have been faxed with confirmation to [facility] in [place].  The patient has been contacted and given the Phone Number to contact if any questions.

## 2019-09-26 ENCOUNTER — Encounter (HOSPITAL_COMMUNITY): Payer: Self-pay

## 2019-09-26 ENCOUNTER — Emergency Department (HOSPITAL_COMMUNITY)
Admission: EM | Admit: 2019-09-26 | Discharge: 2019-09-27 | Disposition: A | Discharge status: Home / Self Care | Payer: 59 | Source: Home / Self Care | Attending: Emergency Medicine | Admitting: Emergency Medicine

## 2019-09-26 ENCOUNTER — Other Ambulatory Visit: Payer: Self-pay

## 2019-09-26 ENCOUNTER — Ambulatory Visit
Admission: RE | Admit: 2019-09-26 | Discharge: 2019-09-26 | Disposition: A | Discharge status: Home / Self Care | Payer: 59 | Source: Ambulatory Visit | Attending: Internal Medicine | Admitting: Internal Medicine

## 2019-09-26 ENCOUNTER — Emergency Department (HOSPITAL_COMMUNITY): Payer: 59

## 2019-09-26 DIAGNOSIS — F329 Major depressive disorder, single episode, unspecified: Secondary | ICD-10-CM | POA: Diagnosis not present

## 2019-09-26 DIAGNOSIS — Z20822 Contact with and (suspected) exposure to covid-19: Secondary | ICD-10-CM | POA: Diagnosis not present

## 2019-09-26 DIAGNOSIS — M48061 Spinal stenosis, lumbar region without neurogenic claudication: Secondary | ICD-10-CM | POA: Diagnosis not present

## 2019-09-26 DIAGNOSIS — M5116 Intervertebral disc disorders with radiculopathy, lumbar region: Secondary | ICD-10-CM | POA: Diagnosis not present

## 2019-09-26 DIAGNOSIS — X58XXXA Exposure to other specified factors, initial encounter: Secondary | ICD-10-CM | POA: Diagnosis not present

## 2019-09-26 DIAGNOSIS — Z884 Allergy status to anesthetic agent status: Secondary | ICD-10-CM | POA: Diagnosis not present

## 2019-09-26 DIAGNOSIS — K219 Gastro-esophageal reflux disease without esophagitis: Secondary | ICD-10-CM | POA: Diagnosis not present

## 2019-09-26 DIAGNOSIS — M5416 Radiculopathy, lumbar region: Secondary | ICD-10-CM | POA: Insufficient documentation

## 2019-09-26 DIAGNOSIS — F419 Anxiety disorder, unspecified: Secondary | ICD-10-CM | POA: Diagnosis not present

## 2019-09-26 DIAGNOSIS — E785 Hyperlipidemia, unspecified: Secondary | ICD-10-CM | POA: Diagnosis not present

## 2019-09-26 DIAGNOSIS — I10 Essential (primary) hypertension: Secondary | ICD-10-CM | POA: Insufficient documentation

## 2019-09-26 DIAGNOSIS — M5117 Intervertebral disc disorders with radiculopathy, lumbosacral region: Secondary | ICD-10-CM | POA: Diagnosis not present

## 2019-09-26 DIAGNOSIS — M4807 Spinal stenosis, lumbosacral region: Secondary | ICD-10-CM | POA: Diagnosis not present

## 2019-09-26 DIAGNOSIS — Z79899 Other long term (current) drug therapy: Secondary | ICD-10-CM | POA: Insufficient documentation

## 2019-09-26 DIAGNOSIS — M5126 Other intervertebral disc displacement, lumbar region: Secondary | ICD-10-CM | POA: Diagnosis not present

## 2019-09-26 DIAGNOSIS — S83282A Other tear of lateral meniscus, current injury, left knee, initial encounter: Secondary | ICD-10-CM | POA: Diagnosis not present

## 2019-09-26 DIAGNOSIS — M23201 Derangement of unspecified lateral meniscus due to old tear or injury, left knee: Secondary | ICD-10-CM | POA: Diagnosis not present

## 2019-09-26 DIAGNOSIS — J45909 Unspecified asthma, uncomplicated: Secondary | ICD-10-CM | POA: Insufficient documentation

## 2019-09-26 DIAGNOSIS — Z7984 Long term (current) use of oral hypoglycemic drugs: Secondary | ICD-10-CM | POA: Insufficient documentation

## 2019-09-26 DIAGNOSIS — E119 Type 2 diabetes mellitus without complications: Secondary | ICD-10-CM | POA: Insufficient documentation

## 2019-09-26 LAB — CBC WITH DIFFERENTIAL/PLATELET
Abs Immature Granulocytes: 0.05 10*3/uL (ref 0.00–0.07)
Basophils Absolute: 0 10*3/uL (ref 0.0–0.1)
Basophils Relative: 0 %
Eosinophils Absolute: 0 10*3/uL (ref 0.0–0.5)
Eosinophils Relative: 0 %
HCT: 43 % (ref 36.0–46.0)
Hemoglobin: 13.7 g/dL (ref 12.0–15.0)
Immature Granulocytes: 0 %
Lymphocytes Relative: 17 %
Lymphs Abs: 2 10*3/uL (ref 0.7–4.0)
MCH: 27.3 pg (ref 26.0–34.0)
MCHC: 31.9 g/dL (ref 30.0–36.0)
MCV: 85.7 fL (ref 80.0–100.0)
Monocytes Absolute: 0.5 10*3/uL (ref 0.1–1.0)
Monocytes Relative: 4 %
Neutro Abs: 9.6 10*3/uL — ABNORMAL HIGH (ref 1.7–7.7)
Neutrophils Relative %: 79 %
Platelets: 352 10*3/uL (ref 150–400)
RBC: 5.02 MIL/uL (ref 3.87–5.11)
RDW: 14.1 % (ref 11.5–15.5)
WBC: 12.2 10*3/uL — ABNORMAL HIGH (ref 4.0–10.5)
nRBC: 0 % (ref 0.0–0.2)

## 2019-09-26 LAB — BASIC METABOLIC PANEL
Anion gap: 13 (ref 5–15)
BUN: 11 mg/dL (ref 6–20)
CO2: 26 mmol/L (ref 22–32)
Calcium: 9.6 mg/dL (ref 8.9–10.3)
Chloride: 95 mmol/L — ABNORMAL LOW (ref 98–111)
Creatinine, Ser: 0.84 mg/dL (ref 0.44–1.00)
GFR calc Af Amer: >60 mL/min (ref >60)
GFR calc non Af Amer: >60 mL/min (ref >60)
Glucose, Bld: 346 mg/dL — ABNORMAL HIGH (ref 70–99)
Potassium: 4.5 mmol/L (ref 3.5–5.1)
Sodium: 134 mmol/L — ABNORMAL LOW (ref 135–145)

## 2019-09-26 LAB — CBG MONITORING, ED: Glucose-Capillary: 284 mg/dL — ABNORMAL HIGH (ref 70–99)

## 2019-09-26 MED ORDER — LORAZEPAM 1 MG PO TABS
0.5000 mg | ORAL_TABLET | ORAL | Status: AC
  Administered 2019-09-26: 0.5 mg via ORAL
  Filled 2019-09-26: qty 1

## 2019-09-26 MED ORDER — DIPHENHYDRAMINE HCL 50 MG/ML IJ SOLN
12.5000 mg | Freq: Once | INTRAMUSCULAR | Status: AC
  Administered 2019-09-26: 12.5 mg via INTRAVENOUS
  Filled 2019-09-26: qty 1

## 2019-09-26 MED ORDER — HYDROMORPHONE HCL 1 MG/ML IJ SOLN
0.5000 mg | Freq: Once | INTRAMUSCULAR | Status: AC
  Administered 2019-09-26: 0.5 mg via INTRAVENOUS
  Filled 2019-09-26: qty 1

## 2019-09-26 MED ORDER — FENTANYL CITRATE (PF) 100 MCG/2ML IJ SOLN
100.0000 ug | Freq: Once | INTRAMUSCULAR | Status: AC
  Administered 2019-09-26: 100 ug via INTRAVENOUS
  Filled 2019-09-26: qty 2

## 2019-09-26 MED ORDER — METHYLPREDNISOLONE ACETATE 40 MG/ML INJ SUSP (RADIOLOG
120.0000 mg | Freq: Once | INTRAMUSCULAR | Status: AC
  Administered 2019-09-26: 120 mg via EPIDURAL

## 2019-09-26 MED ORDER — IOPAMIDOL (ISOVUE-M 200) INJECTION 41%
1.0000 mL | Freq: Once | INTRAMUSCULAR | Status: AC
  Administered 2019-09-26: 1 mL via EPIDURAL

## 2019-09-26 MED ORDER — LORAZEPAM 2 MG/ML IJ SOLN
2.0000 mg | Freq: Once | INTRAMUSCULAR | Status: AC
  Administered 2019-09-27: 2 mg via INTRAVENOUS
  Filled 2019-09-26: qty 1

## 2019-09-26 NOTE — ED Provider Notes (Signed)
Denise Stevens Provider Note   CSN: [id number] Arrival date & time: 09/26/19  1721     History Chief Complaint  Patient presents with  . Leg Pain    Denise Stevens is a 54 y.o. female.  HPI   This patient is a 54-year-old female, unfortunately she developed some back pain with sciatic symptoms going down her left leg approximately 1 week ago and review of the medical record shows that she was seen in the emergency Stevens on February 10 of 2021.  Ultimately the patient followed up with her family doctor, medications were given including oral steroids, muscle relaxers and hydrocodone which she takes with Benadryl secondary to itching.  She was seeming to get a little bit better but the pain was persistent and thus an MRI was ordered and performed yesterday  See the results below   IMPRESSION:  1.  Lumbar spondylosis most significant at L4-5 with disc bulge and the small inferiorly migrating left paracentral disc extrusion which may be abutting the transversing nerve. No significant central canal stenosis.  2.  There is mild neural foraminal narrowing L4-5 and moderate on the left at L5-S1.  Electronically Signed by: Denise Stevens  The patient states that she went for a steroid injection in this area and [facility] radiology today under fluoroscopy.  She felt okay while she was on the table, shortly after returning home she had even more severe pain radiating into the front of the thigh and extending down the lateral leg causing numbness and weakness of the foot.  She notes that she cannot raise her foot like she usually does  Past Medical History:  Diagnosis Date  . Asthma    no intubation or hospitalization hx  . Depression   . Diabetes mellitus   . GERD (gastroesophageal reflux disease)   . HTN (hypertension)   . Hyperlipidemia     Patient Active Problem List   Diagnosis Date Noted  . Thoracic back pain 06/15/2019  . Thyroid nodule  06/26/2016  . Cervical disc disorder with radiculopathy of cervical region 04/01/2016  . Hyperlipidemia 06/05/2015  . GERD (gastroesophageal reflux disease) 06/05/2015  . Allergic rhinitis 06/05/2015  . Keratoconjunctivitis sicca (HCC) 06/05/2015  . Health care maintenance 06/05/2015  . Morbid obesity (HCC) 03/31/2015  . Essential hypertension 08/06/2014  . Dysphoric mood 08/06/2014  . Obstructive sleep apnea 01/21/2014  . Type 2 diabetes mellitus with other specified complication (HCC) 01/05/2013  . UNEQUAL LEG LENGTH 01/17/2009    Past Surgical History:  Procedure Laterality Date  . ABDOMINAL HYSTERECTOMY    . COLONOSCOPY WITH PROPOFOL N/A 04/29/2015   Procedure: COLONOSCOPY WITH PROPOFOL;  Surgeon: Jyothi Mann, MD;  Location: [facility] END[facility]COPY;  Service: Endoscopy;  Laterality: N/A;  . GANGLION CYST EXCISION     R wrist 2000  . MARSUPIALIZATION URETHRAL DIVERTICULUM     2012  . ROTATOR CUFF REPAIR     R 2000  . VESICOVAGINAL FISTULA CLOSURE W/ TAH     nov 07     OB History    Gravida  2   Para  2   Term      Preterm      AB      Living  2     SAB      TAB      Ectopic      Multiple      Live Births                Family History  Problem Relation Age of Onset  . Cancer Father        prostate  . Hyperlipidemia Mother   . Hypertension Mother   . Kidney disease Mother   . Diabetes Mother   . Breast cancer Mother   . Obesity Sister        s/p bypass  . Heart attack Neg Hx     Social History   Tobacco Use  . Smoking status: Never Smoker  . Smokeless tobacco: Never Used  Substance Use Topics  . Alcohol use: Not Currently    Alcohol/week: 0.0 standard drinks  . Drug use: No    Home Medications Prior to Admission medications   Medication Sig Start Date End Date Taking? Authorizing Provider  acetic acid-hydrocortisone (VOSOL-HC) OTIC solution Place 4 drops into both ears 3 (three) times daily. Patient taking differently: Place 4 drops into both  ears 3 (three) times daily as needed (dermatitis).  09/20/17   Bartholomew Crews, MD  ALPRAZolam Duanne Moron) 0.25 MG tablet Take 1 tablet (0.25 mg total) by mouth 3 (three) times daily as needed for anxiety. 06/15/16   Sid [place], MD  Alum & Mag Hydroxide-Simeth (GI COCKTAIL) SUSP suspension Take 30 mLs by mouth 2 (two) times daily as needed for indigestion. Shake well. 06/11/19   Velna Ochs, MD  amoxicillin (AMOXIL) 875 MG tablet Take 1 tablet (875 mg total) by mouth 2 (two) times daily. 09/05/19   Bartholomew Crews, MD  Blood Glucose Monitoring Suppl (ACCU-CHEK GUIDE) w/Device KIT 1 each by Does not apply route 3 (three) times daily as needed. 01/11/19   Bartholomew Crews, MD  buPROPion (WELLBUTRIN XL) 150 MG 24 hr tablet Take 1 tablet (150 mg total) by mouth daily. 04/03/19   Bartholomew Crews, MD  cycloSPORINE (RESTASIS) 0.05 % ophthalmic emulsion Place 1 drop into both eyes at bedtime as needed (dry eyes).    [provider]  escitalopram (LEXAPRO) 10 MG tablet TAKE 2 TABLETS BY MOUTH DAILY. 09/10/19   Bartholomew Crews, MD  fluconazole (DIFLUCAN) 150 MG tablet Take 1 pill now and the second pill in 3 days 09/05/19   Bartholomew Crews, MD  fluticasone [facility]) 50 MCG/ACT nasal spray Place 1 spray into both nostrils 2 (two) times daily. 04/03/19   Bartholomew Crews, MD  gabapentin (NEURONTIN) 300 MG capsule Take one pill before bed for 3 days THEN one pill twice a day for 3 days THEN one pill three times a day 12/29/18   Bartholomew Crews, MD  glipiZIDE (GLUCOTROL) 5 MG tablet TAKE 1 TABLET (5 MG TOTAL) BY MOUTH DAILY BEFORE BREAKFAST. 08/15/19 08/14/20  Bartholomew Crews, MD  glucose blood (FREESTYLE TEST STRIPS) test strip Use as instructed 08/15/19   Bartholomew Crews, MD  glucose monitoring kit (FREESTYLE) monitoring kit 1 each by Does not apply route as needed for other. 08/15/19   Bartholomew Crews, MD  HYDROcodone-acetaminophen (NORCO/VICODIN) 5-325 MG tablet  Take 1 tablet by mouth every 6 (six) hours as needed for severe pain. 09/24/19   Bartholomew Crews, MD  JANUVIA 100 MG tablet TAKE 1 TABLET (100 MG TOTAL) BY MOUTH DAILY. 01/11/19   Bartholomew Crews, MD  Lancets (FREESTYLE) lancets Use as instructed 08/15/19   Bartholomew Crews, MD  lidocaine (LIDODERM) 5 % Place 1-2 patches onto the skin daily. Remove & Discard patch within 12 hours or as directed by MD 06/11/19   Velna Ochs, MD  liraglutide (  VICTOZA) 18 MG/3ML SOPN Inject 0.2 mLs (1.2 mg total) into the skin daily. 08/16/19   Butcher, Elizabeth A, MD  losartan-hydrochlorothiazide (HYZAAR) 100-25 MG tablet Take 1 tablet by mouth daily. 12/26/18   Butcher, Elizabeth A, MD  metaxalone (SKELAXIN) 800 MG tablet TAKE 1 TABLET (800 MG TOTAL) BY MOUTH 3 (THREE) TIMES DAILY AS NEEDED FOR MUSCLE SPASMS. 03/26/19   Butcher, Elizabeth A, MD  methocarbamol (ROBAXIN) 500 MG tablet Take 1 tablet (500 mg total) by mouth 2 (two) times daily. 09/19/19   Caccavale, Sophia, PA-C  pantoprazole (PROTONIX) 40 MG tablet Take 1 tablet (40 mg total) by mouth 2 (two) times daily before a meal. 07/04/19   Butcher, Elizabeth A, MD  pravastatin (PRAVACHOL) 40 MG tablet Take 1 tablet (40 mg total) by mouth daily. 04/03/19   Butcher, Elizabeth A, MD  Semaglutide (RYBELSUS) 3 MG TABS Take 1 tablet by mouth daily. 05/10/19   Butcher, Elizabeth A, MD  UNIFINE PENTIPS 31G X 5 MM MISC USE TO INJECT VICTOZA DAILY 08/08/18   Butcher, Elizabeth A, MD    Allergies    Augmentin [amoxicillin-pot clavulanate], Codeine, Morphine and related, and Tramadol  Review of Systems   Review of Systems  All other systems reviewed and are negative.   Physical Exam Updated Vital Signs BP (!) 155/97 (BP Location: Left Arm)   Pulse (!) 118   Temp 98.2 F (36.8 C) (Oral)   Resp (!) 22   SpO2 97%   Physical Exam Vitals and nursing note reviewed.  Constitutional:      General: She is not in acute distress.    Appearance: She is  well-developed.     Comments: Uncomfortable appearing  HENT:     Head: Normocephalic and atraumatic.     Mouth/Throat:     Pharynx: No oropharyngeal exudate.  Eyes:     General: No scleral icterus.       Right eye: No discharge.        Left eye: No discharge.     Conjunctiva/sclera: Conjunctivae normal.     Pupils: Pupils are equal, round, and reactive to light.  Neck:     Thyroid: No thyromegaly.     Vascular: No JVD.  Cardiovascular:     Rate and Rhythm: Regular rhythm. Tachycardia present.     Heart sounds: Normal heart sounds. No murmur. No friction rub. No gallop.      Comments: Mild tachycardia Pulmonary:     Effort: Pulmonary effort is normal. No respiratory distress.     Breath sounds: Normal breath sounds. No wheezing or rales.  Abdominal:     General: Bowel sounds are normal. There is no distension.     Palpations: Abdomen is soft. There is no mass.     Tenderness: There is no abdominal tenderness.  Musculoskeletal:        General: No tenderness. Normal range of motion.     Cervical back: Normal range of motion and neck supple.     Comments: No tenderness over the back or the legs, compartments are soft and joints are supple.  Lymphadenopathy:     Cervical: No cervical adenopathy.  Skin:    General: Skin is warm and dry.     Findings: No erythema or rash.  Neurological:     Mental Status: She is alert.     Coordination: Coordination normal.     Comments: The patient has the inability to raise the left great toe against resistance and there is slight sensory   deficit in the first webspace as well as the lateral proximal thigh and the anterior shin, normal sensation over the patella and the medial leg as well as the plantar aspect of the foot.  Right lower extremity with normal exam, bilateral upper extremities normal, gait is antalgic secondary to pain  Psychiatric:        Behavior: Behavior normal.     ED Results / Procedures / Treatments   Labs (all labs  ordered are listed, but only abnormal results are displayed) Labs Reviewed - No data to display  EKG None  Radiology DG INJECT DIAG/THERA/INC NEEDLE/CATH/PLC EPI/LUMB/SAC W/IMG  Result Date: 09/26/2019 CLINICAL DATA:  Lumbosacral spondylosis without myelopathy with radiculopathy. Left groin and lateral lower leg pain. L5-S1 epidural injection requested. FLUOROSCOPY TIME:  Radiation Exposure Index (as provided by the fluoroscopic device): 13.6 mGy Fluoroscopy Time:  39 seconds Number of Acquired Images:  0 PROCEDURE: The procedure, risks, benefits, and alternatives were explained to the patient. Questions regarding the procedure were encouraged and answered. The patient understands and consents to the procedure. LUMBAR EPIDURAL INJECTION: An interlaminar approach was performed on the left at L5-S1. The overlying skin was cleansed and anesthetized. A 6 inch 20 gauge epidural needle was advanced using loss-of-resistance technique. DIAGNOSTIC EPIDURAL INJECTION: Injection of Isovue-M 200 shows a good epidural pattern with spread above and below the level of needle placement, primarily in the midline. No vascular opacification is seen. THERAPEUTIC EPIDURAL INJECTION: 120 mg of Depo-Medrol mixed with 3 mL of 1% lidocaine were instilled. The procedure was well-tolerated, and the patient was discharged thirty minutes following the injection in good condition. COMPLICATIONS: None immediate. IMPRESSION: Technically successful interlaminar epidural injection on the left at L5-S1. Electronically Signed   By: William T Derry M.D.   On: 09/26/2019 12:41    Procedures Procedures (including critical care time)  Medications Ordered in ED Medications  fentaNYL (SUBLIMAZE) injection 100 mcg (has no administration in time range)    ED Course  I have reviewed the triage vital signs and the nursing notes.  Pertinent labs & imaging results that were available during my care of the patient were reviewed by me and  considered in my medical decision making (see chart for details).  Clinical Course as of Sep 28 1251  Wed Sep 26, 2019  1944 I discussed the case with Kim from the neurosurgery service covering for Dr. Jones.  She recommends an MRI prior to surgical evaluation.   [BM]    Clinical Course User Index [BM] Miller, Brian, MD   MDM Rules/Calculators/A&P                       There is concern that this patient has an L5 nerve root compression given her exam, I will discuss with neurosurgery.  She is ambulatory but in clear pain, mildly diaphoretic and tachycardic due to her discomfort.  She is a diabetic and the steroids may not be the best long-term solution.  Neurosurgery paged at 7:20 PM  NS has seen patient and recommends MRI to evaluate for cord cmpression after injecxtion  At change of shift - care singed out to Dr. Mesner to f/u results and disposition accordingly.  Pt given a couple of doses of pain medicine and some ativan to facilitate anxiety control for MRI.    Final Clinical Impression(s) / ED Diagnoses Final diagnoses:  None    Rx / DC Orders ED Discharge Orders    None         Miller, Brian, MD 09/28/19 1254  

## 2019-09-26 NOTE — ED Triage Notes (Signed)
Pt presents with pain to her Left groin pain radiating to her Left foot starting today after receiving a steroid injection. Injection was at 1130 am pain started at 1630.

## 2019-09-26 NOTE — ED Notes (Signed)
[phone] Dr Tommy Medal- works with patient and wanted to leave number on chart

## 2019-09-26 NOTE — Discharge Instructions (Signed)

## 2019-09-27 ENCOUNTER — Encounter (HOSPITAL_COMMUNITY): Payer: Self-pay | Admitting: *Deleted

## 2019-09-27 ENCOUNTER — Emergency Department (HOSPITAL_COMMUNITY): Payer: 59

## 2019-09-27 MED ORDER — OXYCODONE-ACETAMINOPHEN 5-325 MG PO TABS: 1.0000 | ORAL_TABLET | ORAL | 0 refills | Status: DC | PRN

## 2019-09-27 MED ORDER — METHYLPREDNISOLONE 4 MG PO TBPK: ORAL_TABLET | ORAL | 0 refills | Status: DC

## 2019-09-27 NOTE — ED Provider Notes (Signed)
7:12 AM Assumed care from Dr. Sabra Heck, please see their note for full history, physical and decision making until this point. In brief this is a 54 y.o. year old female who presented to the ED tonight with Leg Pain     Here with a new L5 radiculopathy after steroid infusion.  Has already been seen by neurosurgery pending MRI for recommendations.  MRI without evidence of epidural hematoma or other compressive pathology some possible bone fragments that could be irritating her nerve root.  Patient's pain is controlled and has minimal neurologic deficit.  Discussed with neurosurgery who recommends outpatient follow-up at this time.  Discussed with patient and reasons to return.  Discharge instructions, including strict return precautions for new or worsening symptoms, given. Patient and/or family verbalized understanding and agreement with the plan as described.   Labs, studies and imaging reviewed by myself and considered in medical decision making if ordered. Imaging interpreted by radiology.  Labs Reviewed  CBC WITH DIFFERENTIAL/PLATELET - Abnormal; Notable for the following components:      Result Value   WBC 12.2 (*)    Neutro Abs 9.6 (*)    All other components within normal limits  BASIC METABOLIC PANEL - Abnormal; Notable for the following components:   Sodium 134 (*)    Chloride 95 (*)    Glucose, Bld 346 (*)    All other components within normal limits  CBG MONITORING, ED - Abnormal; Notable for the following components:   Glucose-Capillary 284 (*)    All other components within normal limits    MR LUMBAR SPINE WO CONTRAST  Final Result      No follow-ups on file.    Keontae Levingston, Corene Cornea, MD 09/27/19 [phone]

## 2019-09-27 NOTE — ED Notes (Signed)
Patient verbalizes understanding of discharge instructions. Opportunity for questioning and answers were provided. Armband removed by staff, pt discharged from ED. Pt. ambulatory and discharged home.  

## 2019-09-28 ENCOUNTER — Encounter: Payer: Self-pay | Admitting: Internal Medicine

## 2019-09-28 ENCOUNTER — Ambulatory Visit (INDEPENDENT_AMBULATORY_CARE_PROVIDER_SITE_OTHER): Payer: 59 | Admitting: Internal Medicine

## 2019-09-28 ENCOUNTER — Inpatient Hospital Stay (HOSPITAL_COMMUNITY)
Admission: AD | Admit: 2019-09-28 | Discharge: 2019-10-02 | DRG: 520 | Disposition: A | Discharge status: Home / Self Care | Payer: 59 | Source: Ambulatory Visit | Attending: Neurological Surgery | Admitting: Neurological Surgery

## 2019-09-28 VITALS — BP 181/105 | HR 109 | Wt 310.6 lb

## 2019-09-28 DIAGNOSIS — E119 Type 2 diabetes mellitus without complications: Secondary | ICD-10-CM | POA: Diagnosis present

## 2019-09-28 DIAGNOSIS — Z881 Allergy status to other antibiotic agents status: Secondary | ICD-10-CM

## 2019-09-28 DIAGNOSIS — E1169 Type 2 diabetes mellitus with other specified complication: Secondary | ICD-10-CM | POA: Diagnosis present

## 2019-09-28 DIAGNOSIS — Z419 Encounter for procedure for purposes other than remedying health state, unspecified: Secondary | ICD-10-CM

## 2019-09-28 DIAGNOSIS — I10 Essential (primary) hypertension: Secondary | ICD-10-CM | POA: Diagnosis present

## 2019-09-28 DIAGNOSIS — G4733 Obstructive sleep apnea (adult) (pediatric): Secondary | ICD-10-CM | POA: Diagnosis present

## 2019-09-28 DIAGNOSIS — F329 Major depressive disorder, single episode, unspecified: Secondary | ICD-10-CM | POA: Diagnosis present

## 2019-09-28 DIAGNOSIS — Z9889 Other specified postprocedural states: Secondary | ICD-10-CM

## 2019-09-28 DIAGNOSIS — J45909 Unspecified asthma, uncomplicated: Secondary | ICD-10-CM | POA: Diagnosis present

## 2019-09-28 DIAGNOSIS — K219 Gastro-esophageal reflux disease without esophagitis: Secondary | ICD-10-CM | POA: Diagnosis present

## 2019-09-28 DIAGNOSIS — M5416 Radiculopathy, lumbar region: Secondary | ICD-10-CM

## 2019-09-28 DIAGNOSIS — M48061 Spinal stenosis, lumbar region without neurogenic claudication: Secondary | ICD-10-CM | POA: Diagnosis present

## 2019-09-28 DIAGNOSIS — Z884 Allergy status to anesthetic agent status: Secondary | ICD-10-CM

## 2019-09-28 DIAGNOSIS — Z880 Allergy status to penicillin: Secondary | ICD-10-CM

## 2019-09-28 DIAGNOSIS — Z885 Allergy status to narcotic agent status: Secondary | ICD-10-CM

## 2019-09-28 DIAGNOSIS — M4807 Spinal stenosis, lumbosacral region: Secondary | ICD-10-CM

## 2019-09-28 DIAGNOSIS — Z91048 Other nonmedicinal substance allergy status: Secondary | ICD-10-CM

## 2019-09-28 DIAGNOSIS — Z791 Long term (current) use of non-steroidal anti-inflammatories (NSAID): Secondary | ICD-10-CM

## 2019-09-28 DIAGNOSIS — E785 Hyperlipidemia, unspecified: Secondary | ICD-10-CM | POA: Diagnosis present

## 2019-09-28 DIAGNOSIS — Z20822 Contact with and (suspected) exposure to covid-19: Secondary | ICD-10-CM | POA: Diagnosis present

## 2019-09-28 DIAGNOSIS — M5116 Intervertebral disc disorders with radiculopathy, lumbar region: Principal | ICD-10-CM | POA: Diagnosis present

## 2019-09-28 DIAGNOSIS — Z79899 Other long term (current) drug therapy: Secondary | ICD-10-CM

## 2019-09-28 DIAGNOSIS — Z7984 Long term (current) use of oral hypoglycemic drugs: Secondary | ICD-10-CM

## 2019-09-28 DIAGNOSIS — Z888 Allergy status to other drugs, medicaments and biological substances status: Secondary | ICD-10-CM

## 2019-09-28 DIAGNOSIS — F419 Anxiety disorder, unspecified: Secondary | ICD-10-CM | POA: Diagnosis present

## 2019-09-28 LAB — CBC
HCT: 41.1 % (ref 36.0–46.0)
Hemoglobin: 13.3 g/dL (ref 12.0–15.0)
MCH: 27.5 pg (ref 26.0–34.0)
MCHC: 32.4 g/dL (ref 30.0–36.0)
MCV: 84.9 fL (ref 80.0–100.0)
Platelets: 330 K/uL (ref 150–400)
RBC: 4.84 MIL/uL (ref 3.87–5.11)
RDW: 14.2 % (ref 11.5–15.5)
WBC: 14.3 K/uL — ABNORMAL HIGH (ref 4.0–10.5)
nRBC: 0 % (ref 0.0–0.2)

## 2019-09-28 LAB — COMPREHENSIVE METABOLIC PANEL
ALT: 39 U/L (ref 0–44)
AST: 37 U/L (ref 15–41)
Albumin: 4 g/dL (ref 3.5–5.0)
Alkaline Phosphatase: 75 U/L (ref 38–126)
Anion gap: 12 (ref 5–15)
BUN: 15 mg/dL (ref 6–20)
CO2: 24 mmol/L (ref 22–32)
Calcium: 9.4 mg/dL (ref 8.9–10.3)
Chloride: 100 mmol/L (ref 98–111)
Creatinine, Ser: 0.9 mg/dL (ref 0.44–1.00)
GFR calc Af Amer: >60 mL/min (ref >60)
GFR calc non Af Amer: >60 mL/min (ref >60)
Glucose, Bld: 220 mg/dL — ABNORMAL HIGH (ref 70–99)
Potassium: 4.7 mmol/L (ref 3.5–5.1)
Sodium: 136 mmol/L (ref 135–145)
Total Bilirubin: 1.7 mg/dL — ABNORMAL HIGH (ref 0.3–1.2)
Total Protein: 7.9 g/dL (ref 6.5–8.1)

## 2019-09-28 LAB — HIV ANTIBODY (ROUTINE TESTING W REFLEX): HIV Screen 4th Generation wRfx: NONREACTIVE

## 2019-09-28 LAB — GLUCOSE, CAPILLARY
Glucose-Capillary: 261 mg/dL — ABNORMAL HIGH (ref 70–99)
Glucose-Capillary: 187 mg/dL — ABNORMAL HIGH (ref 70–99)

## 2019-09-28 MED ORDER — LOSARTAN POTASSIUM-HCTZ 100-25 MG PO TABS: 1.0000 | ORAL_TABLET | Freq: Every day | ORAL | Status: DC

## 2019-09-28 MED ORDER — LIDOCAINE 5 % EX PTCH
1.0000 | MEDICATED_PATCH | CUTANEOUS | Status: DC
  Administered 2019-09-29: 1 via TRANSDERMAL
  Filled 2019-09-28 (×2): qty 1

## 2019-09-28 MED ORDER — PANTOPRAZOLE SODIUM 40 MG PO TBEC
40.0000 mg | DELAYED_RELEASE_TABLET | Freq: Two times a day (BID) | ORAL | Status: DC
  Administered 2019-09-28 – 2019-10-02 (×7): 40 mg via ORAL
  Filled 2019-09-28 (×7): qty 1

## 2019-09-28 MED ORDER — ENOXAPARIN SODIUM 40 MG/0.4ML ~~LOC~~ SOLN
40.0000 mg | SUBCUTANEOUS | Status: DC
  Administered 2019-09-28 – 2019-09-29 (×2): 40 mg via SUBCUTANEOUS
  Filled 2019-09-28 (×4): qty 0.4

## 2019-09-28 MED ORDER — HYDROCHLOROTHIAZIDE 25 MG PO TABS
25.0000 mg | ORAL_TABLET | Freq: Every day | ORAL | Status: DC
  Administered 2019-09-28 – 2019-10-02 (×4): 25 mg via ORAL
  Filled 2019-09-28 (×4): qty 1

## 2019-09-28 MED ORDER — POLYETHYLENE GLYCOL 3350 17 G PO PACK
17.0000 g | PACK | Freq: Every day | ORAL | Status: DC | PRN
  Administered 2019-09-28: 17 g via ORAL
  Filled 2019-09-28: qty 1

## 2019-09-28 MED ORDER — INSULIN ASPART 100 UNIT/ML ~~LOC~~ SOLN
0.0000 [IU] | Freq: Three times a day (TID) | SUBCUTANEOUS | Status: DC
  Administered 2019-09-28: 11 [IU] via SUBCUTANEOUS
  Administered 2019-09-29: 4 [IU] via SUBCUTANEOUS
  Administered 2019-09-29: 15 [IU] via SUBCUTANEOUS
  Administered 2019-09-29: 11 [IU] via SUBCUTANEOUS
  Administered 2019-09-30: 20 [IU] via SUBCUTANEOUS
  Administered 2019-09-30: 3 [IU] via SUBCUTANEOUS
  Administered 2019-09-30: 11 [IU] via SUBCUTANEOUS
  Administered 2019-10-01 – 2019-10-02 (×2): 6 [IU] via SUBCUTANEOUS
  Administered 2019-10-02: 11 [IU] via SUBCUTANEOUS

## 2019-09-28 MED ORDER — KETOROLAC TROMETHAMINE 30 MG/ML IJ SOLN
30.0000 mg | Freq: Four times a day (QID) | INTRAMUSCULAR | Status: DC | PRN
  Administered 2019-09-29 – 2019-10-01 (×6): 30 mg via INTRAVENOUS
  Filled 2019-09-28 (×6): qty 1

## 2019-09-28 MED ORDER — ALPRAZOLAM 0.25 MG PO TABS
0.2500 mg | ORAL_TABLET | Freq: Three times a day (TID) | ORAL | Status: DC | PRN
  Administered 2019-09-28 – 2019-09-29 (×2): 0.25 mg via ORAL
  Filled 2019-09-28 (×2): qty 1

## 2019-09-28 MED ORDER — PRAVASTATIN SODIUM 40 MG PO TABS
40.0000 mg | ORAL_TABLET | Freq: Every day | ORAL | Status: DC
  Administered 2019-09-28 – 2019-10-02 (×4): 40 mg via ORAL
  Filled 2019-09-28 (×4): qty 1

## 2019-09-28 MED ORDER — KETOROLAC TROMETHAMINE 30 MG/ML IJ SOLN
30.0000 mg | Freq: Once | INTRAMUSCULAR | Status: AC
  Administered 2019-09-29: 30 mg via INTRAVENOUS
  Filled 2019-09-28: qty 1

## 2019-09-28 MED ORDER — ESCITALOPRAM OXALATE 20 MG PO TABS
20.0000 mg | ORAL_TABLET | Freq: Every day | ORAL | Status: DC
  Administered 2019-09-28 – 2019-10-02 (×4): 20 mg via ORAL
  Filled 2019-09-28: qty 1
  Filled 2019-09-28 (×2): qty 2
  Filled 2019-09-28 (×2): qty 1

## 2019-09-28 MED ORDER — OXYCODONE HCL 5 MG PO TABS
5.0000 mg | ORAL_TABLET | ORAL | Status: DC | PRN
  Administered 2019-09-28 – 2019-09-30 (×9): 5 mg via ORAL
  Filled 2019-09-28 (×9): qty 1

## 2019-09-28 MED ORDER — METHOCARBAMOL 500 MG PO TABS: 500.0000 mg | ORAL_TABLET | Freq: Two times a day (BID) | ORAL | Status: DC

## 2019-09-28 MED ORDER — FLUTICASONE PROPIONATE 50 MCG/ACT NA SUSP
1.0000 | Freq: Two times a day (BID) | NASAL | Status: DC
  Administered 2019-09-29 – 2019-10-02 (×4): 1 via NASAL
  Filled 2019-09-28 (×2): qty 16

## 2019-09-28 MED ORDER — BUPROPION HCL ER (XL) 150 MG PO TB24
150.0000 mg | ORAL_TABLET | Freq: Every day | ORAL | Status: DC
  Administered 2019-09-28 – 2019-10-02 (×4): 150 mg via ORAL
  Filled 2019-09-28 (×5): qty 1

## 2019-09-28 MED ORDER — GABAPENTIN 300 MG PO CAPS
300.0000 mg | ORAL_CAPSULE | Freq: Every day | ORAL | Status: DC
  Administered 2019-09-28 – 2019-10-01 (×4): 300 mg via ORAL
  Filled 2019-09-28 (×4): qty 1

## 2019-09-28 MED ORDER — MORPHINE SULFATE (PF) 4 MG/ML IV SOLN: 4.0000 mg | Freq: Once | INTRAVENOUS | Status: DC

## 2019-09-28 MED ORDER — SENNA 8.6 MG PO TABS
1.0000 | ORAL_TABLET | Freq: Two times a day (BID) | ORAL | Status: DC
  Administered 2019-09-28 – 2019-09-30 (×5): 8.6 mg via ORAL
  Filled 2019-09-28 (×5): qty 1

## 2019-09-28 MED ORDER — LOSARTAN POTASSIUM 50 MG PO TABS
100.0000 mg | ORAL_TABLET | Freq: Every day | ORAL | Status: DC
  Administered 2019-09-28 – 2019-10-02 (×4): 100 mg via ORAL
  Filled 2019-09-28 (×4): qty 2

## 2019-09-28 MED ORDER — DICLOFENAC SODIUM 1 % EX GEL
4.0000 g | Freq: Three times a day (TID) | CUTANEOUS | Status: DC | PRN
  Filled 2019-09-28: qty 100

## 2019-09-28 MED ORDER — CYCLOBENZAPRINE HCL 5 MG PO TABS
5.0000 mg | ORAL_TABLET | Freq: Three times a day (TID) | ORAL | Status: DC | PRN
  Administered 2019-09-28 – 2019-10-01 (×8): 5 mg via ORAL
  Filled 2019-09-28 (×9): qty 1

## 2019-09-28 MED ORDER — FENTANYL CITRATE (PF) 100 MCG/2ML IJ SOLN
100.0000 ug | Freq: Four times a day (QID) | INTRAMUSCULAR | Status: DC | PRN
  Administered 2019-09-28 – 2019-09-29 (×3): 100 ug via INTRAVENOUS
  Filled 2019-09-28 (×3): qty 2

## 2019-09-28 NOTE — Progress Notes (Signed)
Internal Medicine Clinic Attending  I saw and evaluated the patient.  I personally confirmed the key portions of the history and exam documented by Dr. Santos-Sanchez and I reviewed pertinent patient test results.  The assessment, diagnosis, and plan were formulated together and I agree with the documentation in the resident's note. 

## 2019-09-28 NOTE — Progress Notes (Signed)
   CC: Lumbar radiculopathy  HPI:  [name] is a 54 y.o. year-old female with PMH listed below who presents to clinic for lumbar radiculopathy. Please see problem based assessment and plan for further details.   Past Medical History:  Diagnosis Date  . Asthma    no intubation or hospitalization hx  . Depression   . Diabetes mellitus   . GERD (gastroesophageal reflux disease)   . HTN (hypertension)   . Hyperlipidemia    Review of Systems:   Review of Systems  Constitutional: Negative for chills, fever, malaise/fatigue and weight loss.  Musculoskeletal: Negative for falls.  Neurological: Positive for sensory change and focal weakness.    Physical Exam:  Vitals:   09/28/19 1138  BP: (!) 181/105  Pulse: (!) 109  SpO2: 99%  Weight: (!) 310 lb 9.6 oz (140.9 kg)    General: very pleasant female, in moderate distress due to pain  Neuro: there is weakness on L dorsiflexion, there is numbness on L foot up to shin  Ext: warm and well perfused without edema, 2+ DP pulses    Assessment & Plan:   See Encounters Tab for problem based charting.  Patient discussed with Dr. Lynnae January

## 2019-09-28 NOTE — Assessment & Plan Note (Addendum)
Denise Stevens has been having left lower extremity radicular pain for the past 2 weeks. She reports she feels a sharp, shooting pain in the groin that radiates down the leg. It is usually intermittent but has been constant for the last 5 days or so. She has tried NSAIDs, muscle relaxer, tramadol, and hydrocodone with no relief. She also had a steroid injection 2 days ago that has not provided any relief. She has been having a difficult time sleeping and using the restroom due to pain. The pain has worsened for the past week requiring two separate visits to the ED for pain control. MRI lumbar spine performed yesterday showed a shallow disc herniation at L4-5 with caudal migration of some disc material in the left lateral recess behind L5, narrowing the left lateral recess and potentially compressing the left L5 nerve. Neurosurgery was consulted in the ED and recommended outpatient follow up given absence of neurological deficits. However, she started experiencing L ankle dorsiflexion weakness a few days ago and this morning she noticed she could not feel her toes. On exam, she does have numbness of L toes up to shin and ankle dorsiflexion weakness. Will plan for direct admission from clinic for pain control.   - Med-surg bed ordered, sign-out given to inpatient team  - Fentanyl 100 mcg, has received this dose in th ED recently and tolerated it well  - Neurosurgery consult  - Start bowel regimen as her pain exacerbates when she strains to have BM

## 2019-09-28 NOTE — Progress Notes (Signed)
Spoke with patient about all of her options. Since she has failed conservative treatments and her pain has progressively gotten worse she would like to move forward with surgical intervention. We will plan for an L4-L5 microdiscectomy on the left on Monday. We discussed all of the risks associated with the surgery including spinal fluid leak, bleeding, infection, nerve root damage, numbness, weakness, lack of relief of symptoms, worsening symptoms and need for further surgery, loss of bowel and bladder function, instability, vascular injury, and anesthesia risks. She wishes to proceed.

## 2019-09-28 NOTE — Evaluation (Signed)
Physical Therapy Evaluation Patient Details Name: Denise Stevens MRN: [id number] DOB: 1966/08/02 Today's Date: 09/28/2019   History of Present Illness  Pt is a 54 y/o female admitted from internal medicine clinic secondary to worsening back pain. MRI of the back revealed "Lumbar spondylosis most significant at L4-5 with disc bulge and the small inferiorly migrating left paracentral disc extrusion which may be abutting the transversing nerve. No significant central canal stenosis. There is mild neural foraminal narrowing L4-5 and moderate on the left at L5-S1." Pt awaiting further neurosurgery recs. PMH including but not limited to HTN and DM.    Clinical Impression  Pt presented supine in bed with HOB elevated, awake and willing to participate in therapy session. Prior to admission, pt reported that she was independent with all functional mobility and ADLs. At the time of evaluation, pt significantly limited with mobility secondary to increasing pain in her L groin and L calf. She was able to ambulate a very short distance in her room with RW and supervision for safety. Pt reporting some relief with use of RW but would like to trial a cane at the next visit. Pt would continue to benefit from skilled physical therapy services at this time while admitted and after d/c to address the below listed limitations in order to improve overall safety and independence with functional mobility.     Follow Up Recommendations Outpatient PT    Equipment Recommendations  Rolling walker with 5" wheels;Other (comment)(vs cane)    Recommendations for Other Services       Precautions / Restrictions Precautions Precautions: None Restrictions Weight Bearing Restrictions: No      Mobility  Bed Mobility Overal bed mobility: Modified Independent             General bed mobility comments: cueing for log roll technique for comfort  Transfers Overall transfer level: Needs assistance Equipment used:  Rolling walker (2 wheeled) Transfers: Sit to/from Stand Sit to Stand: Supervision         General transfer comment: cueing for hand placement and technique, supervision for safety  Ambulation/Gait Ambulation/Gait assistance: Supervision Gait Distance (Feet): 20 Feet Assistive device: Rolling walker (2 wheeled) Gait Pattern/deviations: Step-to pattern;Decreased step length - right;Decreased stance time - left;Decreased stride length;Antalgic Gait velocity: decreased   General Gait Details: pt with very slow, guarded gait; requiring two standing rest breaks secondary to severe pain; cueing for safety with use of RW  Stairs            Wheelchair Mobility    Modified Rankin (Stroke Patients Only)       Balance Overall balance assessment: No apparent balance deficits (not formally assessed)                                           Pertinent Vitals/Pain Pain Assessment: Faces Faces Pain Scale: Hurts whole lot Pain Location: L groin, L calf and foot Pain Descriptors / Indicators: Grimacing;Guarding Pain Intervention(s): Monitored during session;Repositioned    Home Living Family/patient expects to be discharged to:: Private residence Living Arrangements: Children Available Help at Discharge: Family   Home Access: Stairs to enter Entrance Stairs-Rails: Psychiatric nurse of Steps: 30   Home Equipment: None      Prior Function Level of Independence: Independent         Comments: works at [employer] in the [employer]  Hand Dominance        Extremity/Trunk Assessment   Upper Extremity Assessment Upper Extremity Assessment: Overall WFL for tasks assessed    Lower Extremity Assessment Lower Extremity Assessment: LLE deficits/detail;Overall WFL for tasks assessed LLE: Unable to fully assess due to pain       Communication   Communication: No difficulties  Cognition Arousal/Alertness:  Awake/alert Behavior During Therapy: WFL for tasks assessed/performed Overall Cognitive Status: Within Functional Limits for tasks assessed                                        General Comments      Exercises     Assessment/Plan    PT Assessment Patient needs continued PT services  PT Problem List Decreased activity tolerance;Decreased mobility;Pain;Decreased knowledge of use of DME       PT Treatment Interventions DME instruction;Gait training;Stair training;Functional mobility training;Therapeutic activities;Therapeutic exercise;Balance training;Neuromuscular re-education;Patient/family education    PT Goals (Current goals can be found in the Care Plan section)  Acute Rehab PT Goals Patient Stated Goal: decrease pain PT Goal Formulation: With patient Time For Goal Achievement: 10/12/19 Potential to Achieve Goals: Good    Frequency Min 3X/week   Barriers to discharge        Co-evaluation               AM-PAC PT "6 Clicks" Mobility  Outcome Measure Help needed turning from your back to your side while in a flat bed without using bedrails?: None Help needed moving from lying on your back to sitting on the side of a flat bed without using bedrails?: None Help needed moving to and from a bed to a chair (including a wheelchair)?: None Help needed standing up from a chair using your arms (e.g., wheelchair or bedside chair)?: None Help needed to walk in hospital room?: None Help needed climbing 3-5 steps with a railing? : A Little 6 Click Score: 23    End of Session   Activity Tolerance: Patient limited by pain Patient left: in bed;with call bell/phone within reach;with family/visitor present;Other (comment)(LAB tech in room) Nurse Communication: Mobility status PT Visit Diagnosis: Other abnormalities of gait and mobility (R26.89);Pain Pain - Right/Left: Left Pain - part of body: Hip;Leg    Time: [id number] PT Time Calculation (min) (ACUTE  ONLY): 19 min   Charges:   PT Evaluation $PT Eval Low Complexity: 1 Low          Eduard Clos, PT, DPT  Acute Rehabilitation Services Pager [phone] Office [place] 09/28/2019, 3:21 PM

## 2019-09-28 NOTE — H&P (Signed)
Date: 09/28/2019               Patient Name:  Denise Stevens MRN: [id number]  DOB: Mar 27, 1966 Age / Sex: 54 y.o., female   PCP: Bartholomew Crews, MD         Medical Service: Internal Medicine Teaching Service         Attending Physician: Dr. Aldine Contes, MD    First Contact: Marva Panda, MD, [place] Pager: [place] [phone])  Second Contact: Sherry Ruffing, MD, [place] Pager: [place] [phone])       After Hours (After 5p/  First Contact Pager: [phone]  weekends / holidays): Second Contact Pager: [phone]   Chief Complaint: back pain  History of Present Illness: [name] is a  54 y.o. female w/ PMH significant for hypertension, hyperlipidemia, diabetes mellitus, asthma, depression, and GERD presenting with approximately two weeks of left leg pain and numbness that acutely worsened over the past two days. She notes symptoms initially started off with low back, buttock and left leg pain that is shooting down her left calf and into her toes (except pinky toe). She had a MRI back in 2017 that demonstrated L4-L5 and L5-S1 degenerative changes with mild central canal narrowing w/mild encroachment on left S1 nerve root. She was seen in the ED and treated with fentanyl, ketorolac and robaxin with improvement in symptoms. She has also been taking steroids without any significant improvement in her leg pain and numbness. Over the past few weeks, she also developed weakness of left ankle dorsiflexion for which repeat MRI ordered significant for lumbar spondylosis most significant at L4-5 with disc bulge with caudal migration and mild neural foraminal narrowing L4-5 and moderate on left at L5-S1. She also had a steroid injection a few days ago but noted even more severe pain radiating from left groin and extending to the front of thigh and down lateral aspect of leg, causing numbness and weakness in the foot. She notes that she has not been able to raise her foot and upon walking, she has to hunch over  as it is too painful to stand up straight. She was seen in the emergency department on 2/17 and case discussed with neurosurgery at the time who recommended repeat MRI. Repeat MRI with possible bone fragments that could be irritating nerve roots but otherwise unremarkable and recommended for outpatient follow up. Patient was seen in clinic today for worsening pain and admitted for further pain control and neurosurgery evaluation. Patient notes that she has had a decreased appetite recently due to uncontrolled pain. Patient denies any headaches, fevers, chills, nausea/vomiting, abdominal pain, chest pain, shortness of breath, vision changes or other focal deficits.   Meds:  Current Facility-Administered Medications for the 09/28/19 encounter [facility] Encounter)  Medication  . alum & mag hydroxide-simeth (MAALOX/MYLANTA) 200-200-20 MG/5ML suspension 30 mL  . lidocaine (XYLOCAINE) 2 % viscous mouth solution 15 mL   Current Meds  Medication Sig  . buPROPion (WELLBUTRIN XL) 150 MG 24 hr tablet Take 1 tablet (150 mg total) by mouth daily.  . cyclobenzaprine (FLEXERIL) 10 MG tablet Take 10 mg by mouth at bedtime as needed for muscle spasms (or TMJ symptoms).  . diphenhydrAMINE (BENADRYL) 25 MG tablet Take 25 mg by mouth as needed (to tolerate Hydrocodone).  Marland Kitchen escitalopram (LEXAPRO) 10 MG tablet TAKE 2 TABLETS BY MOUTH DAILY. (Patient taking differently: Take 20 mg by mouth daily. )  . fluticasone (FLONASE) 50 MCG/ACT nasal spray Place 1 spray into both nostrils  2 (two) times daily. (Patient taking differently: Place 1 spray into both nostrils 2 (two) times daily as needed for allergies or rhinitis. )  . gabapentin (NEURONTIN) 300 MG capsule Take one pill before bed for 3 days THEN one pill twice a day for 3 days THEN one pill three times a day (Patient taking differently: Take 300 mg by mouth 3 (three) times daily. )  . glipiZIDE (GLUCOTROL) 5 MG tablet TAKE 1 TABLET (5 MG TOTAL) BY MOUTH DAILY BEFORE  BREAKFAST.  Marland Kitchen HYDROcodone-acetaminophen (NORCO/VICODIN) 5-325 MG tablet Take 1 tablet by mouth every 6 (six) hours as needed for severe pain. (Patient taking differently: Take 1 tablet by mouth every 6 (six) hours. )  . JANUVIA 100 MG tablet TAKE 1 TABLET (100 MG TOTAL) BY MOUTH DAILY. (Patient taking differently: Take 100 mg by mouth daily. )  . liraglutide (VICTOZA) 18 MG/3ML SOPN Inject 0.2 mLs (1.2 mg total) into the skin daily.  Marland Kitchen losartan-hydrochlorothiazide (HYZAAR) 100-25 MG tablet Take 1 tablet by mouth daily.  . methocarbamol (ROBAXIN) 500 MG tablet Take 1 tablet (500 mg total) by mouth 2 (two) times daily.  . pantoprazole (PROTONIX) 40 MG tablet Take 1 tablet (40 mg total) by mouth 2 (two) times daily before a meal.  . pravastatin (PRAVACHOL) 40 MG tablet Take 1 tablet (40 mg total) by mouth daily.     Allergies: Allergies as of 09/28/2019 - Review Complete 09/28/2019  Allergen Reaction Noted  . Semaglutide Anaphylaxis and Other (See Comments) 09/28/2019  . Augmentin [amoxicillin-pot clavulanate] Diarrhea 04/29/2015  . Codeine Itching 04/02/2011  . Lidoderm [lidocaine] Other (See Comments) 09/28/2019  . Morphine and related Itching 04/02/2011  . Tape Itching 09/28/2019  . Tramadol Itching 01/12/2018   Past Medical History:  Diagnosis Date  . Asthma    no intubation or hospitalization hx  . Depression   . Diabetes mellitus   . GERD (gastroesophageal reflux disease)   . HTN (hypertension)   . Hyperlipidemia     Family History:  Family History  Problem Relation Age of Onset  . Cancer Father        prostate  . Hyperlipidemia Mother   . Hypertension Mother   . Kidney disease Mother   . Diabetes Mother   . Breast cancer Mother   . Obesity Sister        s/p bypass  . Heart attack Neg Hx      Social History:  Social History   Tobacco Use  . Smoking status: Never Smoker  . Smokeless tobacco: Never Used  Substance Use Topics  . Alcohol use: Not Currently     Alcohol/week: 0.0 standard drinks  . Drug use: No   Patient works at [employer] in the [place]. She lives in [place], [place] and has two sons, 72 and 70. Her youngest son is in [place], [place] and is able to come and help her as needed. Kiearra is otherwise independent in her ADLs. She does not have any history of tobacco use or illicit drug use. She reports occasional alcohol use.   Review of Systems: A complete ROS was negative except as per HPI.   Physical Exam: Blood pressure (!) 161/99, pulse (!) 106, temperature 98.6 F (37 C), temperature source Oral, resp. rate 20, SpO2 98 %. Physical Exam Vitals and nursing note reviewed.  Constitutional:      General: She is in acute distress.     Appearance: She is not ill-appearing, toxic-appearing or diaphoretic.  HENT:     Head: Normocephalic and atraumatic.  Eyes:     General: No scleral icterus.    Extraocular Movements: Extraocular movements intact.     Conjunctiva/sclera: Conjunctivae normal.  Cardiovascular:     Rate and Rhythm: Regular rhythm. Tachycardia present.     Pulses: Normal pulses.     Heart sounds: Normal heart sounds. No murmur. No friction rub.  Pulmonary:     Effort: Pulmonary effort is normal. No respiratory distress.     Breath sounds: Normal breath sounds. No wheezing or rales.  Abdominal:     General: Bowel sounds are normal. There is no distension.     Palpations: Abdomen is soft.     Tenderness: There is no abdominal tenderness. There is no guarding or rebound.     Hernia: No hernia is present.  Musculoskeletal:        General: No swelling, tenderness or deformity. Normal range of motion.     Cervical back: Normal range of motion and neck supple.  Skin:    General: Skin is warm and dry.     Capillary Refill: Capillary refill takes less than 2 seconds.     Findings: No lesion or rash.  Neurological:     Mental Status: She is alert and oriented to person, place, and time.      Sensory: Sensory deficit present.     Motor: Weakness present.     Comments: LLE: diminished sensation along distribution of peroneal nerve; strength 4/5  RLE: sensation grossly intact throughout; strength 5/5 RUE/LUE: sensation grossly intact; strength 5/5 bilaterally CN II-XII: grossly intact     CBC Latest Ref Rng & Units 09/28/2019 09/26/2019 03/06/2019  WBC 4.0 - 10.5 K/uL 14.3(H) 12.2(H) 13.0(H)  Hemoglobin 12.0 - 15.0 g/dL 13.3 13.7 -  Hematocrit 36.0 - 46.0 % 41.1 43.0 -  Platelets 150 - 400 K/uL 330 352 -   CMP Latest Ref Rng & Units 09/28/2019 09/26/2019 08/23/2019  Glucose 70 - 99 mg/dL 220(H) 346(H) 212(H)  BUN 6 - 20 mg/dL 15 11 9   Creatinine 0.44 - 1.00 mg/dL 0.90 0.84 0.94  Sodium 135 - 145 mmol/L 136 134(L) 137  Potassium 3.5 - 5.1 mmol/L 4.7 4.5 4.0  Chloride 98 - 111 mmol/L 100 95(L) 95(L)  CO2 22 - 32 mmol/L 24 26 25   Calcium 8.9 - 10.3 mg/dL 9.4 9.6 9.6  Total Protein 6.5 - 8.1 g/dL 7.9 - -  Total Bilirubin 0.3 - 1.2 mg/dL 1.7(H) - -  Alkaline Phos 38 - 126 U/L 75 - -  AST 15 - 41 U/L 37 - -  ALT 0 - 44 U/L 39 - -   EKG: pending   MRI LUMBAR SPINE WO CONTRAST 09/27/2019:  IMPRESSION: No finding attributable to an injection today. L4-5: Shallow disc herniation with caudal migration of some disc material in the left lateral recess behind L5, narrowing the left lateral recess and potentially compressing the left L5 nerve. Facet osteoarthritis at this level also contributes to canal stenosis at the disc level. L5-S1: Shallow left posterolateral disc protrusion. Mild narrowing of the left lateral recess but no visible neural compression. Facet osteoarthritis at this level  Assessment & Plan by Problem: Ms. Jamessa Demus is a 54yr old community dwelling female with PMHx of diabetes mellitus, hypertension, and hyperlipidemia presenting with left leg pain and sensory changes for several weeks that have acutely worsened over the past few days.   Lumbar  radiculopathy: Patient with several weeks of left leg  pain, previously treated with muscle relaxant, steroids, NSAIDs and hydrocodone. However, continues to have persistent pain radiating from her left groin, along the anterior aspect of her thigh and lateral aspect of her calf. Pain is worsened with movement and notes that it is slightly improved with heat. She has also noted left extremity weakness with sensory changes to where she feels numbness along the lateral aspect of her left calf, dorsal aspect of foot, and into her first through fourth toes. On exam, she is noted to have 4/5 strength in the left lower extremity with diminished sensation along the peroneal nerve distribution. No significant tenderness to palpation.  - Neurosurgery consulted, appreciate their recommendations  - MRI left hip and knee  - PT/OT evaluation - Pain control with fentanyl 100 mcg q6h prn - Flexeril 5mg  tid prn - CBC daily  DM II:  HbA1c 11.5. She is on glipizide 5mg  daily, Januvia 100mg  daily, and Victoza 1.2mg  daily. She notes that her recent CBG's have been elevated in 200's in setting of steroid use.  - CBG daily - SSI   Hypertension: Patient's BP 161/99 in setting of increased pain. - HYZAAR 100-25mg  daily - Continue to monitor vitals  Hyperlipidemia: - Continue pravastatin 40mg  daily  Depression/anxiety: - Continue bupropion 150mg  daily - Continue escitalopram 20mg  daily - Continue Xanax 0.25mg  tid prn   FEN/GI:  Diet: Heart healthy/carb modified Electrolytes: Monitor and replete prn Fluids: None  Bowel regimen: Miralax + Senokot   VTE Prophylaxis: Lovenox 40mg  daily Code status: FULL   Dispo: Admit patient to Inpatient with expected length of stay greater than 2 midnights.  Signed: Harvie Heck, MD  Internal Medicine, PGY-1 Pager: [phone] 09/28/2019, 3:18 PM

## 2019-09-28 NOTE — Progress Notes (Signed)
Patient received from Internal Medicine clinic with lumbar radiculopathy. Report received.  Awaiting admission orders from Internal Medicine

## 2019-09-28 NOTE — Progress Notes (Signed)
54 year old female who is an active patient of Dr. Ronnald Ramp.  She is admitted with back and left lower extremity pain which is worsening over the past few weeks.  Symptoms increased further after recent lumbar epidural steroid injection.  Patient notes feelings of weakness and sensory loss into her left foot.  She has no significant right-sided symptoms.  She has no bowel or bladder dysfunction.  MRI scan does demonstrate evidence of a leftward L4-5 disc herniation with some caudal migration and compression of her left L5 nerve root.  She has some stable degenerative change at L5-S1.  Patient with a significant left-sided L4-5 disc herniation.  Symptoms recently worsened by lumbar epidural steroid injection.  Recommend observation for now.  Should her symptoms fail to improve over the next few days then she may require surgical intervention by Dr. Ronnald Ramp.  Plan for Dr. Ronnald Ramp to see the patient on Monday morning if she still in the hospital.  Otherwise she can follow-up with him as an outpatient.

## 2019-09-29 ENCOUNTER — Observation Stay (HOSPITAL_COMMUNITY): Payer: 59

## 2019-09-29 DIAGNOSIS — K219 Gastro-esophageal reflux disease without esophagitis: Secondary | ICD-10-CM | POA: Diagnosis present

## 2019-09-29 DIAGNOSIS — X58XXXA Exposure to other specified factors, initial encounter: Secondary | ICD-10-CM | POA: Diagnosis not present

## 2019-09-29 DIAGNOSIS — Z20822 Contact with and (suspected) exposure to covid-19: Secondary | ICD-10-CM | POA: Diagnosis present

## 2019-09-29 DIAGNOSIS — Z79899 Other long term (current) drug therapy: Secondary | ICD-10-CM | POA: Diagnosis not present

## 2019-09-29 DIAGNOSIS — Z880 Allergy status to penicillin: Secondary | ICD-10-CM | POA: Diagnosis not present

## 2019-09-29 DIAGNOSIS — F329 Major depressive disorder, single episode, unspecified: Secondary | ICD-10-CM | POA: Diagnosis not present

## 2019-09-29 DIAGNOSIS — Z981 Arthrodesis status: Secondary | ICD-10-CM | POA: Diagnosis not present

## 2019-09-29 DIAGNOSIS — Z791 Long term (current) use of non-steroidal anti-inflammatories (NSAID): Secondary | ICD-10-CM | POA: Diagnosis not present

## 2019-09-29 DIAGNOSIS — M3501 Sicca syndrome with keratoconjunctivitis: Secondary | ICD-10-CM | POA: Diagnosis not present

## 2019-09-29 DIAGNOSIS — M5116 Intervertebral disc disorders with radiculopathy, lumbar region: Secondary | ICD-10-CM | POA: Diagnosis not present

## 2019-09-29 DIAGNOSIS — Z888 Allergy status to other drugs, medicaments and biological substances status: Secondary | ICD-10-CM | POA: Diagnosis not present

## 2019-09-29 DIAGNOSIS — S83282A Other tear of lateral meniscus, current injury, left knee, initial encounter: Secondary | ICD-10-CM | POA: Diagnosis not present

## 2019-09-29 DIAGNOSIS — M1612 Unilateral primary osteoarthritis, left hip: Secondary | ICD-10-CM | POA: Diagnosis not present

## 2019-09-29 DIAGNOSIS — E785 Hyperlipidemia, unspecified: Secondary | ICD-10-CM | POA: Diagnosis not present

## 2019-09-29 DIAGNOSIS — M5126 Other intervertebral disc displacement, lumbar region: Secondary | ICD-10-CM | POA: Diagnosis not present

## 2019-09-29 DIAGNOSIS — Z885 Allergy status to narcotic agent status: Secondary | ICD-10-CM | POA: Diagnosis not present

## 2019-09-29 DIAGNOSIS — M23352 Other meniscus derangements, posterior horn of lateral meniscus, left knee: Secondary | ICD-10-CM | POA: Diagnosis not present

## 2019-09-29 DIAGNOSIS — F419 Anxiety disorder, unspecified: Secondary | ICD-10-CM | POA: Diagnosis not present

## 2019-09-29 DIAGNOSIS — G4733 Obstructive sleep apnea (adult) (pediatric): Secondary | ICD-10-CM | POA: Diagnosis present

## 2019-09-29 DIAGNOSIS — M23201 Derangement of unspecified lateral meniscus due to old tear or injury, left knee: Secondary | ICD-10-CM | POA: Diagnosis not present

## 2019-09-29 DIAGNOSIS — M48061 Spinal stenosis, lumbar region without neurogenic claudication: Secondary | ICD-10-CM | POA: Diagnosis not present

## 2019-09-29 DIAGNOSIS — E119 Type 2 diabetes mellitus without complications: Secondary | ICD-10-CM | POA: Diagnosis not present

## 2019-09-29 DIAGNOSIS — Z7984 Long term (current) use of oral hypoglycemic drugs: Secondary | ICD-10-CM | POA: Diagnosis not present

## 2019-09-29 DIAGNOSIS — I10 Essential (primary) hypertension: Secondary | ICD-10-CM | POA: Diagnosis not present

## 2019-09-29 DIAGNOSIS — S83272A Complex tear of lateral meniscus, current injury, left knee, initial encounter: Secondary | ICD-10-CM | POA: Diagnosis not present

## 2019-09-29 DIAGNOSIS — J45909 Unspecified asthma, uncomplicated: Secondary | ICD-10-CM | POA: Diagnosis present

## 2019-09-29 LAB — BASIC METABOLIC PANEL
Anion gap: 12 (ref 5–15)
BUN: 12 mg/dL (ref 6–20)
CO2: 28 mmol/L (ref 22–32)
Calcium: 9.4 mg/dL (ref 8.9–10.3)
Chloride: 96 mmol/L — ABNORMAL LOW (ref 98–111)
Creatinine, Ser: 0.94 mg/dL (ref 0.44–1.00)
GFR calc Af Amer: >60 mL/min (ref >60)
GFR calc non Af Amer: >60 mL/min (ref >60)
Glucose, Bld: 235 mg/dL — ABNORMAL HIGH (ref 70–99)
Potassium: 4 mmol/L (ref 3.5–5.1)
Sodium: 136 mmol/L (ref 135–145)

## 2019-09-29 LAB — CBC
HCT: 39.8 % (ref 36.0–46.0)
Hemoglobin: 12.6 g/dL (ref 12.0–15.0)
MCH: 27.2 pg (ref 26.0–34.0)
MCHC: 31.7 g/dL (ref 30.0–36.0)
MCV: 86 fL (ref 80.0–100.0)
Platelets: 273 10*3/uL (ref 150–400)
RBC: 4.63 MIL/uL (ref 3.87–5.11)
RDW: 14.2 % (ref 11.5–15.5)
WBC: 15 10*3/uL — ABNORMAL HIGH (ref 4.0–10.5)
nRBC: 0 % (ref 0.0–0.2)

## 2019-09-29 LAB — GLUCOSE, CAPILLARY
Glucose-Capillary: 314 mg/dL — ABNORMAL HIGH (ref 70–99)
Glucose-Capillary: 186 mg/dL — ABNORMAL HIGH (ref 70–99)
Glucose-Capillary: 279 mg/dL — ABNORMAL HIGH (ref 70–99)
Glucose-Capillary: 276 mg/dL — ABNORMAL HIGH (ref 70–99)

## 2019-09-29 LAB — SARS CORONAVIRUS 2 (TAT 6-24 HRS): SARS Coronavirus 2: NEGATIVE

## 2019-09-29 MED ORDER — LORAZEPAM 2 MG/ML IJ SOLN
2.0000 mg | Freq: Once | INTRAMUSCULAR | Status: AC
  Administered 2019-09-29: 2 mg via INTRAVENOUS
  Filled 2019-09-29: qty 1

## 2019-09-29 MED ORDER — LORAZEPAM 2 MG/ML IJ SOLN: 1.0000 mg | Freq: Once | INTRAMUSCULAR | Status: DC

## 2019-09-29 MED ORDER — DIPHENHYDRAMINE HCL 25 MG PO CAPS: 25.0000 mg | ORAL_CAPSULE | Freq: Every evening | ORAL | Status: DC | PRN

## 2019-09-29 NOTE — Plan of Care (Signed)
  Problem: Education: Goal: Knowledge of General Education information will improve Description: Including pain rating scale, medication(s)/side effects and non-pharmacologic comfort measures Outcome: Progressing   Problem: Activity: Goal: Risk for activity intolerance will decrease Outcome: Progressing   Problem: Nutrition: Goal: Adequate nutrition will be maintained Outcome: Progressing   Problem: Coping: Goal: Level of anxiety will decrease Outcome: Progressing   

## 2019-09-29 NOTE — Progress Notes (Signed)
   Providing Compassionate, Quality Care - Together   Subjective: Patient reports pain radiating down her left lower extremity. She has associated numbness and tingling.  Objective: Vital signs in last 24 hours: Temp:  [97.8 F (36.6 C)-98.7 F (37.1 C)] 97.8 F (36.6 C) (02/20 0732) Pulse Rate:  [93-109] 93 (02/20 0732) Resp:  [17-20] 17 (02/20 0732) BP: (123-181)/(83-105) 131/83 (02/20 0732) SpO2:  [95 %-99 %] 95 % (02/20 0732) Weight:  [140.9 kg] 140.9 kg (02/19 1138)  Intake/Output from previous day: 02/19 0701 - 02/20 0700 In: 240 [P.O.:240] Out: -  Intake/Output this shift: Total I/O In: 240 [P.O.:240] Out: -   Alert and oriented x 4 PERRLA CN II-XII grossly intact MAE, Strength 5/5 BUE, RLE 4/5 dorsiflexion, 4/5 plantar flexion (pain limiting full lower extremity examination), decreased sensation in the LLE in the L5 distribution    Lab Results: Recent Labs    09/28/19 1435 09/29/19 0348  WBC 14.3* 15.0*  HGB 13.3 12.6  HCT 41.1 39.8  PLT 330 273   BMET Recent Labs    09/28/19 1435 09/29/19 0348  NA 136 136  K 4.7 4.0  CL 100 96*  CO2 24 28  GLUCOSE 220* 235*  BUN 15 12  CREATININE 0.90 0.94  CALCIUM 9.4 9.4    Studies/Results: No results found.  Assessment/Plan: Patient admitted on 09/28/2019 due to increased low back pain with LLE symptoms. MRI demonstrated evidence of a leftward L4-5 disc herniation with some caudal migration and compression of her left L5 nerve root. Plan is for surgery on Monday, 10/01/2019, by Dr. Ronnald Ramp.   LOS: 0 days    -Continue pain management -Will transfer to [place], [place], AGNP-C Nurse Practitioner  [facility] [place]. [address], [place], [place], [place] [address] P: [phone]    F: [phone]  09/29/2019, 10:15 AM

## 2019-09-29 NOTE — Progress Notes (Signed)
Swabbed patient for Covid 19 test at 13:42.  Patient tolerated well.

## 2019-09-29 NOTE — Progress Notes (Signed)
Patient off the floor to Radiology for MRI.  Patient accompanied by radiology staff, transported in the bed. Patient is alert and oriented x 4 upon leaving the unit.

## 2019-09-29 NOTE — Progress Notes (Signed)
PT Cancellation Note  Patient Details Name: Denise Stevens MRN: [id number] DOB: 05/14/1966   Cancelled Treatment:    Reason Eval/Treat Not Completed: Patient at procedure or test/unavailable. Pt off unit for MRI. Will check back as time allows.   Benjiman Core, PTA Pager [phone] Acute Rehab   VEAR [place] 09/29/2019, 11:39 AM

## 2019-09-29 NOTE — Progress Notes (Addendum)
Subjective: HD#1 Overnight, patient noted to have continued pain for which she was given one dose toradol with improvement.   This morning, patient evaluated at bedside. She notes that she still has pain in her left leg but notes that the toradol helped. She was able to have a bowel movement yesterday and work with physical therapy. We discussed that will be obtaining MRI of her left leg for which patient agreeable but requesting sedation prior to study due to anxiety. She is scheduled for microdiscectomy on Monday.   Objective:  Vital signs in last 24 hours: Vitals:   09/28/19 1238 09/28/19 1911 09/29/19 0359  BP: (!) 161/99 137/88 123/84  Pulse: (!) 106 97 94  Resp: 20 18 18   Temp: 98.6 F (37 C) 98.7 F (37.1 C) 98.7 F (37.1 C)  TempSrc: Oral Oral Oral  SpO2: 98% 98% 99%   CBC Latest Ref Rng & Units 09/29/2019 09/28/2019 09/26/2019  WBC 4.0 - 10.5 K/uL 15.0(H) 14.3(H) 12.2(H)  Hemoglobin 12.0 - 15.0 g/dL 12.6 13.3 13.7  Hematocrit 36.0 - 46.0 % 39.8 41.1 43.0  Platelets 150 - 400 K/uL 273 330 352   BMP Latest Ref Rng & Units 09/29/2019 09/28/2019 09/26/2019  Glucose 70 - 99 mg/dL 235(H) 220(H) 346(H)  BUN 6 - 20 mg/dL 12 15 11   Creatinine 0.44 - 1.00 mg/dL 0.94 0.90 0.84  BUN/Creat Ratio 9 - 23 - - -  Sodium 135 - 145 mmol/L 136 136 134(L)  Potassium 3.5 - 5.1 mmol/L 4.0 4.7 4.5  Chloride 98 - 111 mmol/L 96(L) 100 95(L)  CO2 22 - 32 mmol/L 28 24 26   Calcium 8.9 - 10.3 mg/dL 9.4 9.4 9.6   Physical Exam  Constitutional: She is oriented to person, place, and time and well-developed, well-nourished, and in no distress.  Eyes: Conjunctivae and EOM are normal. No scleral icterus.  Cardiovascular: Normal rate, regular rhythm, normal heart sounds and intact distal pulses. Exam reveals no gallop and no friction rub.  No murmur heard. Pulmonary/Chest: Effort normal and breath sounds normal. No respiratory distress. She has no wheezes. She has no rales.  Abdominal: Soft. Bowel  sounds are normal. She exhibits no distension. There is no abdominal tenderness. There is no rebound.  Musculoskeletal:        General: No edema. Normal range of motion.     Comments: Mild tenderness to palpation of the medial aspect of left calf  Neurological: She is alert and oriented to person, place, and time. No cranial nerve deficit.  Abnormal sensation along the distribution of the peroneal nerve and along plantar aspect of foot   Skin: Skin is warm and dry. No erythema.    Assessment/Plan: Denise Stevens is a 54yr old community dwelling female with PMHx of diabetes mellitus, hypertension and hyperlipidemia presenting with left leg pain and sensory changes for few weeks that have acutely worsened over the past few days.   L4-5 disc herniation with lumbar radiculopathy: Patient notes persistent sharp shooting pain radiating from left groin to lateral aspect of calf and down to her big toe. She notes slight relief with flexeril, oxycodone and toradol. On exam, weakness on dorsiflexion of left foot and sensory changes noted along the common peroneal nerve distribution. She does have mild tenderness to palpation of the medial aspect of the left calf. Obtaining MRI of left hip and knee to assess for any other nerve impingement.  - Neurosurgery consulted, appreciate their recommendations - Microdiscectomy scheduled for Monday 2/22 - Flexeril  5mg  tid prn - Pain control with oxycodone IR 5mg  q3h prn and toradol IV 30mg  q6h prn - Continue PT/OT participation - CBC daily  DM II:  Patient on glipizide 5mg  qd, Januvia 100mg  qd and Victoza 1.2mg  qd at home. CBG's 200-300 range. - CBG monitoring - SSI (resistant)  Hypertension: - Losartan 100mg  qd - HCTZ 25mg  qd  Hyperlipidemia: - Pravastatin 40mg  qd  Depression/anxiety:  - Continue bupropion 150mg  qd - Continue escitalopram 20mg  qd - Continue Xanax 0.25mg  tid prn  FEN/GI:  Diet: Heart healthy/carb modified Electrolytes: Monitor  and replete prn Fluids: None  Bowel regimen: Miralax + Senokot   VTE Prophylaxis: Lovenox 40mg  daily Code status: FULL   Prior to Admission Living Arrangement: Home Anticipated Discharge Location: Home Barriers to Discharge: Continued medical management  Dispo: Anticipated discharge in approximately 3-4 day(s).   Harvie Heck, MD  Internal Medicine, PGY-1 Pager: [phone] 09/29/2019, 6:41 AM

## 2019-09-29 NOTE — Evaluation (Signed)
Occupational Therapy Evaluation Patient Details Name: Denise Stevens MRN: [id number] DOB: October 21, 1965 Today's Date: 09/29/2019    History of Present Illness Pt is a 54 y/o female admitted from internal medicine clinic secondary to worsening back pain. MRI of the back revealed "Lumbar spondylosis most significant at L4-5 with disc bulge and the small inferiorly migrating left paracentral disc extrusion which may be abutting the transversing nerve. No significant central canal stenosis. There is mild neural foraminal narrowing L4-5 and moderate on the left at L5-S1." Pt awaiting further neurosurgery recs. PMH including but not limited to HTN and DM.   Clinical Impression   PTA, pt was living alone and was independent; pt's son planning to stay at dc. Pt currently performing LB ADLs and functional mobility with RW at [place] level. Pt with limited functional performance due to increased pain at back and LLE. Educating pt on back precautions, bed mobility, LB ADLs, and tub transfer with 3n1. Pt agreeable to further education on AE for LB ADLs. Pt highly motivated despite pain. Pt would benefit from further acute OT to facilitate safe dc. Recommend dc to home with HHOT for further OT to optimize safety, independence with ADLs, and return to PLOF.    Follow Up Recommendations  Home health OT;Supervision/Assistance - 24 hour ; will reassess after possible sx on Monday. May not need HH.    Equipment Recommendations  3 in 1 bedside commode;Other (comment)(possibly AE)    Recommendations for Other Services PT consult     Precautions / Restrictions Precautions Precautions: None Restrictions Weight Bearing Restrictions: No      Mobility Bed Mobility Overal bed mobility: Modified Independent             General bed mobility comments: cueing for log roll technique for comfort  Transfers Overall transfer level: Needs assistance Equipment used: Rolling walker (2 wheeled) Transfers: Sit  to/from Stand Sit to Stand: Supervision         General transfer comment: cueing for hand placement and technique, supervision for safety    Balance Overall balance assessment: No apparent balance deficits (not formally assessed)                                         ADL either performed or assessed with clinical judgement   ADL Overall ADL's : Needs assistance/impaired Eating/Feeding: Set up;Supervision/ safety(standing) Eating/Feeding Details (indicate cue type and reason): Pt wanting to eat breakfast. However, was having increased pain with sitting and laying supine. Pt standing to eat breakfast and reported decreased pain at LLE Grooming: Set up;Supervision/safety;Standing   Upper Body Bathing: Set up;Supervision/ safety;Sitting   Lower Body Bathing: Sit to/from stand;Min guard   Upper Body Dressing : Set up;Supervision/safety;Sitting   Lower Body Dressing: Sit to/from stand;Min guard Lower Body Dressing Details (indicate cue type and reason): Pt able to don socks using figure four method. However, significant pain with benting LLE. Pt open to learning about AE for LB ADLs, Toilet Transfer: Ambulation;RW;Supervision/safety(simulated in room)         Tub/Shower Transfer Details (indicate cue type and reason): Providing education on use of 3N1 as shower seat within a tub. Pt verablized understanding Functional mobility during ADLs: Min guard;Rolling walker;Supervision/safety General ADL Comments: Pt with limited functional performance due to pain at LLE. Pt highly motivated to particiapte in therapy despite pain     Vision Baseline Vision/History: Wears  glasses Patient Visual Report: No change from baseline       Perception     Praxis      Pertinent Vitals/Pain Pain Assessment: Faces Faces Pain Scale: Hurts whole lot Pain Location: L groin, L calf and foot Pain Descriptors / Indicators: Grimacing;Guarding Pain Intervention(s): Monitored during  session;Limited activity within patient's tolerance;Repositioned     Hand Dominance Right   Extremity/Trunk Assessment Upper Extremity Assessment Upper Extremity Assessment: Overall WFL for tasks assessed   Lower Extremity Assessment Lower Extremity Assessment: Defer to PT evaluation;LLE deficits/detail LLE Deficits / Details: Numbness and decreased sensation at left side of calf and top of foot LLE: Unable to fully assess due to pain   Cervical / Trunk Assessment Cervical / Trunk Assessment: Other exceptions Cervical / Trunk Exceptions: Lumbar spondylosis most significant at L4-5 with disc bulge    Communication Communication Communication: No difficulties   Cognition Arousal/Alertness: Awake/alert Behavior During Therapy: WFL for tasks assessed/performed Overall Cognitive Status: Within Functional Limits for tasks assessed                                     General Comments       Exercises     Shoulder Instructions      Home Living Family/patient expects to be discharged to:: Private residence Living Arrangements: Children Available Help at Discharge: Family Type of Home: Apartment(Third floor apartment) Home Access: Stairs to enter Technical brewer of Steps: 30 Entrance Stairs-Rails: Right;Left Home Layout: One level     Bathroom Shower/Tub: Teacher, early years/pre: Standard     Home Equipment: None          Prior Functioning/Environment Level of Independence: Independent        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        OT Problem List: Decreased strength;Decreased activity tolerance;Decreased range of motion;Impaired balance (sitting and/or standing);Decreased knowledge of use of DME or AE;Decreased knowledge of precautions      OT Treatment/Interventions: Self-care/ADL training;Therapeutic exercise;Energy conservation;DME and/or AE instruction;Therapeutic activities;Patient/family  education    OT Goals(Current goals can be found in the care plan section) Acute Rehab OT Goals Patient Stated Goal: decrease pain OT Goal Formulation: With patient Time For Goal Achievement: 10/13/19 Potential to Achieve Goals: Good  OT Frequency: Min 2X/week   Barriers to D/C:            Co-evaluation              AM-PAC OT "6 Clicks" Daily Activity     Outcome Measure Help from another person eating meals?: None Help from another person taking care of personal grooming?: A Little Help from another person toileting, which includes using toliet, bedpan, or urinal?: A Little Help from another person bathing (including washing, rinsing, drying)?: A Little Help from another person to put on and taking off regular upper body clothing?: A Little Help from another person to put on and taking off regular lower body clothing?: A Little 6 Click Score: 19   End of Session Equipment Utilized During Treatment: Rolling walker Nurse Communication: Mobility status  Activity Tolerance: Patient tolerated treatment well;Patient limited by pain Patient left: in bed;with call bell/phone within reach  OT Visit Diagnosis: Unsteadiness on feet (R26.81);Other abnormalities of gait and mobility (R26.89);Muscle weakness (generalized) (M62.81)                Time:  0801-0829 OT Time Calculation (min): 28 min Charges:  OT General Charges $OT Visit: 1 Visit OT Evaluation $OT Eval Moderate Complexity: 1 Mod OT Treatments $Self Care/Home Management : 8-22 mins  Evona Westra MSOT, OTR/L Acute Rehab Pager: [phone] Office: [place] 09/29/2019, 10:35 AM

## 2019-09-30 DIAGNOSIS — M23201 Derangement of unspecified lateral meniscus due to old tear or injury, left knee: Secondary | ICD-10-CM

## 2019-09-30 LAB — SURGICAL PCR SCREEN
MRSA, PCR: NEGATIVE
Staphylococcus aureus: NEGATIVE

## 2019-09-30 LAB — CBC
HCT: 38.7 % (ref 36.0–46.0)
Hemoglobin: 12.5 g/dL (ref 12.0–15.0)
MCH: 27.4 pg (ref 26.0–34.0)
MCHC: 32.3 g/dL (ref 30.0–36.0)
MCV: 84.9 fL (ref 80.0–100.0)
Platelets: 259 10*3/uL (ref 150–400)
RBC: 4.56 MIL/uL (ref 3.87–5.11)
RDW: 14.1 % (ref 11.5–15.5)
WBC: 11.3 10*3/uL — ABNORMAL HIGH (ref 4.0–10.5)
nRBC: 0 % (ref 0.0–0.2)

## 2019-09-30 LAB — GLUCOSE, CAPILLARY
Glucose-Capillary: 261 mg/dL — ABNORMAL HIGH (ref 70–99)
Glucose-Capillary: 356 mg/dL — ABNORMAL HIGH (ref 70–99)
Glucose-Capillary: 150 mg/dL — ABNORMAL HIGH (ref 70–99)
Glucose-Capillary: 285 mg/dL — ABNORMAL HIGH (ref 70–99)

## 2019-09-30 LAB — BASIC METABOLIC PANEL
Anion gap: 13 (ref 5–15)
BUN: 14 mg/dL (ref 6–20)
CO2: 27 mmol/L (ref 22–32)
Calcium: 9 mg/dL (ref 8.9–10.3)
Chloride: 97 mmol/L — ABNORMAL LOW (ref 98–111)
Creatinine, Ser: 0.8 mg/dL (ref 0.44–1.00)
GFR calc Af Amer: >60 mL/min (ref >60)
GFR calc non Af Amer: >60 mL/min (ref >60)
Glucose, Bld: 276 mg/dL — ABNORMAL HIGH (ref 70–99)
Potassium: 3.6 mmol/L (ref 3.5–5.1)
Sodium: 137 mmol/L (ref 135–145)

## 2019-09-30 MED ORDER — OXYCODONE HCL 5 MG PO TABS
10.0000 mg | ORAL_TABLET | ORAL | Status: DC | PRN
  Administered 2019-09-30 – 2019-10-01 (×8): 10 mg via ORAL
  Filled 2019-09-30 (×8): qty 2

## 2019-09-30 NOTE — Progress Notes (Signed)
   Subjective: Patient reports that she is doing okay today, no acute events overnight. She feels that her pain is relatively under control, she just moved around in bed and reports that she is now having some pain. Discussed results of knee MRI and that there is a meniscal tear. She reports that last April she had a fall and landed on her knee. She reports that she saw sports medicine around that time and had an ultrasound that was negative.   Objective:  Vital signs in last 24 hours: Vitals:   09/29/19 1548 09/29/19 1944 09/29/19 2316 09/30/19 0408  BP: 96/74 117/80 109/82 124/81  Pulse: (!) 105 97 94 93  Resp: 19 18 18 18   Temp: 98.2 F (36.8 C) 98.6 F (37 C) 98.4 F (36.9 C) 98.3 F (36.8 C)  TempSrc: Oral Oral Oral Oral  SpO2: 97% 96% 96% 97%    General: Middle aged female, NAD, laying in bed Cardiac: RRR, no m/r/g Pulmonary: CTABL, no wheezing, rhonchi, rales Abdomen: Soft, non-tender, non-distended Extremity: No swelling or eryhtema over the left knee, some tenderness over lateral aspect of left knee, no pain elicited with internal and external knee rotation   Assessment/Plan:  Principal Problem:   Lumbar radiculopathy Active Problems:   Type 2 diabetes mellitus with other specified complication (HCC)   Essential hypertension   Morbid obesity ([place])  Denise Stevens is a 54yr old community dwelling female with PMHx of diabetes mellitus, hypertension and hyperlipidemia presenting with lower back pain that radiated down her left leg pain and decreased sensation and weakness acutely worsened over the past few days.  Lumbar radiculopathy: -MRI lumbar spine showed L4-5 desiccation and bulging of disc, left disc herniation, lateral recess stenosis that could affect left L5 nerve.  -Obtained a left knee MRI that showed a complex tear posterior horn lateral meniscus. Tricompartmental osteoarthritis appears advanced for age. Able to rotate knee with no pain, no swelling or  warmth noted, some tenderness of lateral aspect of left knee. Awaiting left hip MRI.  -NSG following, appreciate recommendations, plan for microdiscectomy Monday -Consulted orthopedics regarding lateral meniscus tear, plan for o/p follow up -Continue pain control with Oxy IR and Toradol  -Continue PT/OT  DM: Patient on glipizide 5mg  qd, Januvia 100mg  qd and Victoza 1.2mg  qd at home. CBG's 200-300 range. -continue SSI and CBG monitoring  Hypertension: - Losartan 100mg  qd - HCTZ 25mg  qd  Hyperlipidemia: - Pravastatin 40mg  qd  Depression/anxiety:  - Continue bupropion 150mg  qd - Continue escitalopram 20mg  qd - Continue Xanax 0.25mg  tid prn  FEN/GI: Diet: Heart healthy/carb modified Electrolytes: Monitor and replete prn Fluids: None  Bowel regimen: Miralax + Senokot  VTE Prophylaxis:Lovenox40mg  daily Code status:FULL  Prior to Admission Living Arrangement: Home Anticipated Discharge Location: Home Barriers to Discharge: Continued medical management  Dispo: Anticipated discharge in approximately 2-3 day(s).    Denise Noble, MD 09/30/2019, 6:40 AM Pager: [phone]

## 2019-09-30 NOTE — Progress Notes (Signed)
Overall stable.  No new issues or problems.  Patient preop for lumbar microdiscectomy tomorrow.

## 2019-09-30 NOTE — Progress Notes (Signed)
I have reviewed the patient's MRI studies of the lumbar spine, left knee and left hip.  The patient's reported symptoms of worsening back pain and LLE radiculopathy are consistent with lumbar spine MRI findings.  She is scheduled for L4-5 microdisk with Dr. Ronnald Ramp tomorrow.  The patient should follow up with me as an outpatient in about 2-4 weeks, based on how she is recovering from back surgery, to discuss treatment options for the lateral meniscus tear.  Azucena Cecil, MD [facility] [phone] 9:28 AM

## 2019-09-30 NOTE — Progress Notes (Signed)
Physical Therapy Treatment Patient Details Name: Denise Stevens MRN: [id number] DOB: 02-16-1966 Today's Date: 09/30/2019    History of Present Illness Pt is a 54 y/o female admitted from internal medicine clinic secondary to worsening back pain. MRI of the back revealed "Lumbar spondylosis most significant at L4-5 with disc bulge and the small inferiorly migrating left paracentral disc extrusion which may be abutting the transversing nerve. No significant central canal stenosis. There is mild neural foraminal narrowing L4-5 and moderate on the left at L5-S1." Pt awaiting further neurosurgery recs. PMH including but not limited to HTN and DM.    PT Comments    Pt in bed upon arrival of PT, agreeable to PT session focused on progressing mobility and maintaining strength as pt is scheduled for surgery tomorrow 2/22. The pt was able to demonstrate sig improvements in independence with bed mobility, transfers, and ambulation in hall with RW and supervision for safety. The pt will continue to benefit from skilled PT to maximize independence with functional mobility as well as tolerance for and safety with stair training following surgery to facilitate d/c.     Follow Up Recommendations  Outpatient PT(will evaluate following surgery 2/22)     Equipment Recommendations  Rolling walker with 5" wheels;Other (comment)(vs cane, pt reports 30 steps to enter home)    Recommendations for Other Services       Precautions / Restrictions Precautions Precautions: None Restrictions Weight Bearing Restrictions: No    Mobility  Bed Mobility Overal bed mobility: Modified Independent             General bed mobility comments: pt no longer needed cueing for log roll technique for comfort  Transfers Overall transfer level: Needs assistance Equipment used: Rolling walker (2 wheeled) Transfers: Sit to/from Stand Sit to Stand: Supervision         General transfer comment: pt no longer needed  cueing for hand placement and technique, supervision for safety  Ambulation/Gait Ambulation/Gait assistance: Supervision Gait Distance (Feet): 300 Feet Assistive device: Rolling walker (2 wheeled)(trialed cane, pt reported increase in groin pain with use of SPC in R hand) Gait Pattern/deviations: Step-to pattern;Decreased step length - right;Decreased stance time - left;Decreased stride length;Antalgic Gait velocity: decreased Gait velocity interpretation: <1.8 ft/sec, indicate of risk for recurrent falls General Gait Details: pt with slow, but steady gait, good stability and improved endurance. reported mobility decreased pain   Stairs             Wheelchair Mobility    Modified Rankin (Stroke Patients Only)       Balance Overall balance assessment: No apparent balance deficits (not formally assessed)                                          Cognition Arousal/Alertness: Awake/alert Behavior During Therapy: WFL for tasks assessed/performed Overall Cognitive Status: Within Functional Limits for tasks assessed                                        Exercises      General Comments        Pertinent Vitals/Pain Pain Assessment: 0-10 Pain Score: 7  Pain Location: L groin, L calf and foot Pain Descriptors / Indicators: Grimacing;Guarding Pain Intervention(s): Limited activity within patient's tolerance;Monitored during session;Repositioned  Home Living                      Prior Function            PT Goals (current goals can now be found in the care plan section) Acute Rehab PT Goals Patient Stated Goal: decrease pain PT Goal Formulation: With patient Time For Goal Achievement: 10/12/19 Potential to Achieve Goals: Good Progress towards PT goals: Progressing toward goals    Frequency    Min 3X/week      PT Plan Current plan remains appropriate    Co-evaluation              AM-PAC PT "6 Clicks"  Mobility   Outcome Measure  Help needed turning from your back to your side while in a flat bed without using bedrails?: None Help needed moving from lying on your back to sitting on the side of a flat bed without using bedrails?: None Help needed moving to and from a bed to a chair (including a wheelchair)?: None Help needed standing up from a chair using your arms (e.g., wheelchair or bedside chair)?: None Help needed to walk in hospital room?: A Little Help needed climbing 3-5 steps with a railing? : A Little 6 Click Score: 22    End of Session Equipment Utilized During Treatment: Other (comment)(none) Activity Tolerance: Patient tolerated treatment well Patient left: in bed;with call bell/phone within reach   PT Visit Diagnosis: Other abnormalities of gait and mobility (R26.89);Pain Pain - Right/Left: Left Pain - part of body: Hip;Leg     Time: [id number] PT Time Calculation (min) (ACUTE ONLY): 17 min  Charges:  $Gait Training: 8-22 mins                     Karma Ganja, PT, DPT   Acute Rehabilitation Department Pager #: [phone]   Otho Bellows 09/30/2019, 4:43 PM

## 2019-10-01 ENCOUNTER — Inpatient Hospital Stay (HOSPITAL_COMMUNITY): Payer: 59 | Admitting: Anesthesiology

## 2019-10-01 ENCOUNTER — Inpatient Hospital Stay (HOSPITAL_COMMUNITY)
Admission: AD | Disposition: A | Discharge status: Home / Self Care | Payer: Self-pay | Source: Ambulatory Visit | Attending: Internal Medicine

## 2019-10-01 ENCOUNTER — Inpatient Hospital Stay (HOSPITAL_COMMUNITY): Payer: 59

## 2019-10-01 ENCOUNTER — Encounter (HOSPITAL_COMMUNITY): Payer: Self-pay | Admitting: Internal Medicine

## 2019-10-01 DIAGNOSIS — X58XXXA Exposure to other specified factors, initial encounter: Secondary | ICD-10-CM

## 2019-10-01 DIAGNOSIS — Z9889 Other specified postprocedural states: Secondary | ICD-10-CM

## 2019-10-01 DIAGNOSIS — S83282A Other tear of lateral meniscus, current injury, left knee, initial encounter: Secondary | ICD-10-CM

## 2019-10-01 HISTORY — PX: LUMBAR LAMINECTOMY/DECOMPRESSION MICRODISCECTOMY: SHX5026

## 2019-10-01 LAB — GLUCOSE, CAPILLARY
Glucose-Capillary: 271 mg/dL — ABNORMAL HIGH (ref 70–99)
Glucose-Capillary: 147 mg/dL — ABNORMAL HIGH (ref 70–99)
Glucose-Capillary: 191 mg/dL — ABNORMAL HIGH (ref 70–99)
Glucose-Capillary: 189 mg/dL — ABNORMAL HIGH (ref 70–99)
Glucose-Capillary: 201 mg/dL — ABNORMAL HIGH (ref 70–99)
Glucose-Capillary: 266 mg/dL — ABNORMAL HIGH (ref 70–99)

## 2019-10-01 LAB — CBC
HCT: 36.5 % (ref 36.0–46.0)
Hemoglobin: 11.5 g/dL — ABNORMAL LOW (ref 12.0–15.0)
MCH: 27.4 pg (ref 26.0–34.0)
MCHC: 31.5 g/dL (ref 30.0–36.0)
MCV: 86.9 fL (ref 80.0–100.0)
Platelets: 240 10*3/uL (ref 150–400)
RBC: 4.2 MIL/uL (ref 3.87–5.11)
RDW: 13.8 % (ref 11.5–15.5)
WBC: 12.5 10*3/uL — ABNORMAL HIGH (ref 4.0–10.5)
nRBC: 0 % (ref 0.0–0.2)

## 2019-10-01 LAB — BASIC METABOLIC PANEL
Anion gap: 12 (ref 5–15)
BUN: 14 mg/dL (ref 6–20)
CO2: 27 mmol/L (ref 22–32)
Calcium: 9 mg/dL (ref 8.9–10.3)
Chloride: 95 mmol/L — ABNORMAL LOW (ref 98–111)
Creatinine, Ser: 0.83 mg/dL (ref 0.44–1.00)
GFR calc Af Amer: >60 mL/min (ref >60)
GFR calc non Af Amer: >60 mL/min (ref >60)
Glucose, Bld: 276 mg/dL — ABNORMAL HIGH (ref 70–99)
Potassium: 3.8 mmol/L (ref 3.5–5.1)
Sodium: 134 mmol/L — ABNORMAL LOW (ref 135–145)

## 2019-10-01 SURGERY — LUMBAR LAMINECTOMY/DECOMPRESSION MICRODISCECTOMY 1 LEVEL
Anesthesia: General | Site: Back | Laterality: Left

## 2019-10-01 MED ORDER — FENTANYL CITRATE (PF) 100 MCG/2ML IJ SOLN
INTRAMUSCULAR | Status: AC
  Administered 2019-10-01: 50 ug
  Filled 2019-10-01: qty 2

## 2019-10-01 MED ORDER — BUPIVACAINE HCL (PF) 0.25 % IJ SOLN
INTRAMUSCULAR | Status: DC | PRN
  Administered 2019-10-01: 4 mL

## 2019-10-01 MED ORDER — ONDANSETRON HCL 4 MG/2ML IJ SOLN: 4.0000 mg | Freq: Four times a day (QID) | INTRAMUSCULAR | Status: DC | PRN

## 2019-10-01 MED ORDER — OXYCODONE HCL 5 MG/5ML PO SOLN: 5.0000 mg | Freq: Once | ORAL | Status: DC | PRN

## 2019-10-01 MED ORDER — LACTATED RINGERS IV SOLN: INTRAVENOUS | Status: DC

## 2019-10-01 MED ORDER — MENTHOL 3 MG MT LOZG
1.0000 | LOZENGE | OROMUCOSAL | Status: DC | PRN
  Filled 2019-10-01: qty 9

## 2019-10-01 MED ORDER — CELECOXIB 200 MG PO CAPS
200.0000 mg | ORAL_CAPSULE | Freq: Two times a day (BID) | ORAL | Status: DC
  Administered 2019-10-01 – 2019-10-02 (×2): 200 mg via ORAL
  Filled 2019-10-01 (×2): qty 1

## 2019-10-01 MED ORDER — LACTATED RINGERS IV SOLN: INTRAVENOUS | Status: DC | PRN

## 2019-10-01 MED ORDER — ROCURONIUM BROMIDE 50 MG/5ML IV SOSY
PREFILLED_SYRINGE | INTRAVENOUS | Status: DC | PRN
  Administered 2019-10-01: 60 mg via INTRAVENOUS
  Administered 2019-10-01: 20 mg via INTRAVENOUS

## 2019-10-01 MED ORDER — FENTANYL CITRATE (PF) 100 MCG/2ML IJ SOLN: 50.0000 ug | Freq: Once | INTRAMUSCULAR | Status: AC

## 2019-10-01 MED ORDER — ACETAMINOPHEN 650 MG RE SUPP: 650.0000 mg | RECTAL | Status: DC | PRN

## 2019-10-01 MED ORDER — SODIUM CHLORIDE 0.9 % IV SOLN: 250.0000 mL | INTRAVENOUS | Status: DC

## 2019-10-01 MED ORDER — THROMBIN 5000 UNITS EX SOLR: OROMUCOSAL | Status: DC | PRN

## 2019-10-01 MED ORDER — SENNA 8.6 MG PO TABS
1.0000 | ORAL_TABLET | Freq: Two times a day (BID) | ORAL | Status: DC
  Administered 2019-10-01 – 2019-10-02 (×2): 8.6 mg via ORAL
  Filled 2019-10-01 (×2): qty 1

## 2019-10-01 MED ORDER — ONDANSETRON HCL 4 MG/2ML IJ SOLN
INTRAMUSCULAR | Status: DC | PRN
  Administered 2019-10-01: 4 mg via INTRAVENOUS

## 2019-10-01 MED ORDER — OXYCODONE HCL 5 MG PO TABS: 5.0000 mg | ORAL_TABLET | Freq: Once | ORAL | Status: DC | PRN

## 2019-10-01 MED ORDER — SODIUM CHLORIDE 0.9 % IV SOLN
INTRAVENOUS | Status: DC | PRN
  Administered 2019-10-01: 500 mL

## 2019-10-01 MED ORDER — DEXTROSE 5 % IV SOLN
3.0000 g | Freq: Once | INTRAVENOUS | Status: AC
  Administered 2019-10-01: 3 g via INTRAVENOUS
  Filled 2019-10-01: qty 3

## 2019-10-01 MED ORDER — FENTANYL CITRATE (PF) 250 MCG/5ML IJ SOLN
INTRAMUSCULAR | Status: AC
  Filled 2019-10-01: qty 5

## 2019-10-01 MED ORDER — THROMBIN 5000 UNITS EX SOLR
OROMUCOSAL | Status: DC | PRN
  Administered 2019-10-01: 5 mL via TOPICAL

## 2019-10-01 MED ORDER — PROPOFOL 10 MG/ML IV BOLUS
INTRAVENOUS | Status: AC
  Filled 2019-10-01: qty 40

## 2019-10-01 MED ORDER — DIPHENHYDRAMINE HCL 50 MG/ML IJ SOLN
INTRAMUSCULAR | Status: DC | PRN
  Administered 2019-10-01: 25 mg via INTRAVENOUS

## 2019-10-01 MED ORDER — FENTANYL CITRATE (PF) 100 MCG/2ML IJ SOLN
25.0000 ug | INTRAMUSCULAR | Status: DC | PRN
  Administered 2019-10-01: 50 ug via INTRAVENOUS

## 2019-10-01 MED ORDER — POTASSIUM CHLORIDE IN NACL 20-0.9 MEQ/L-% IV SOLN: INTRAVENOUS | Status: DC

## 2019-10-01 MED ORDER — PHENOL 1.4 % MT LIQD: 1.0000 | OROMUCOSAL | Status: DC | PRN

## 2019-10-01 MED ORDER — SODIUM CHLORIDE 0.9% FLUSH: 3.0000 mL | INTRAVENOUS | Status: DC | PRN

## 2019-10-01 MED ORDER — 0.9 % SODIUM CHLORIDE (POUR BTL) OPTIME
TOPICAL | Status: DC | PRN
  Administered 2019-10-01: 1000 mL

## 2019-10-01 MED ORDER — THROMBIN (RECOMBINANT) 5000 UNITS EX SOLR
CUTANEOUS | Status: AC
  Filled 2019-10-01: qty 10000

## 2019-10-01 MED ORDER — METHOCARBAMOL 500 MG PO TABS
500.0000 mg | ORAL_TABLET | Freq: Four times a day (QID) | ORAL | Status: DC | PRN
  Administered 2019-10-01 – 2019-10-02 (×3): 500 mg via ORAL
  Filled 2019-10-01 (×3): qty 1

## 2019-10-01 MED ORDER — OXYCODONE HCL 5 MG PO TABS
10.0000 mg | ORAL_TABLET | ORAL | Status: DC | PRN
  Administered 2019-10-01 – 2019-10-02 (×5): 10 mg via ORAL
  Filled 2019-10-01 (×5): qty 2

## 2019-10-01 MED ORDER — CEFAZOLIN SODIUM-DEXTROSE 2-4 GM/100ML-% IV SOLN
2.0000 g | Freq: Three times a day (TID) | INTRAVENOUS | Status: AC
  Administered 2019-10-01 – 2019-10-02 (×2): 2 g via INTRAVENOUS
  Filled 2019-10-01 (×2): qty 100

## 2019-10-01 MED ORDER — ONDANSETRON HCL 4 MG/2ML IJ SOLN: 4.0000 mg | Freq: Once | INTRAMUSCULAR | Status: DC | PRN

## 2019-10-01 MED ORDER — FENTANYL CITRATE (PF) 100 MCG/2ML IJ SOLN
INTRAMUSCULAR | Status: DC | PRN
  Administered 2019-10-01 (×2): 100 ug via INTRAVENOUS

## 2019-10-01 MED ORDER — MIDAZOLAM HCL 2 MG/2ML IJ SOLN
INTRAMUSCULAR | Status: AC
  Filled 2019-10-01: qty 2

## 2019-10-01 MED ORDER — BUPIVACAINE HCL (PF) 0.25 % IJ SOLN
INTRAMUSCULAR | Status: AC
  Filled 2019-10-01: qty 30

## 2019-10-01 MED ORDER — MORPHINE SULFATE (PF) 2 MG/ML IV SOLN: 2.0000 mg | INTRAVENOUS | Status: DC | PRN

## 2019-10-01 MED ORDER — PROPOFOL 10 MG/ML IV BOLUS
INTRAVENOUS | Status: DC | PRN
  Administered 2019-10-01: 200 mg via INTRAVENOUS

## 2019-10-01 MED ORDER — ONDANSETRON HCL 4 MG PO TABS: 4.0000 mg | ORAL_TABLET | Freq: Four times a day (QID) | ORAL | Status: DC | PRN

## 2019-10-01 MED ORDER — SUGAMMADEX SODIUM 200 MG/2ML IV SOLN
INTRAVENOUS | Status: DC | PRN
  Administered 2019-10-01: 400 mg via INTRAVENOUS

## 2019-10-01 MED ORDER — THROMBIN 5000 UNITS EX SOLR
CUTANEOUS | Status: AC
  Filled 2019-10-01: qty 5000

## 2019-10-01 MED ORDER — FENTANYL CITRATE (PF) 100 MCG/2ML IJ SOLN
INTRAMUSCULAR | Status: AC
  Administered 2019-10-01: 15:00:00 50 ug via INTRAVENOUS
  Filled 2019-10-01: qty 2

## 2019-10-01 MED ORDER — ACETAMINOPHEN 325 MG PO TABS
650.0000 mg | ORAL_TABLET | ORAL | Status: DC | PRN
  Administered 2019-10-01 – 2019-10-02 (×2): 650 mg via ORAL
  Filled 2019-10-01 (×2): qty 2

## 2019-10-01 MED ORDER — MIDAZOLAM HCL 5 MG/5ML IJ SOLN
INTRAMUSCULAR | Status: DC | PRN
  Administered 2019-10-01: 1 mg via INTRAVENOUS

## 2019-10-01 MED ORDER — SODIUM CHLORIDE 0.9% FLUSH: 3.0000 mL | Freq: Two times a day (BID) | INTRAVENOUS | Status: DC

## 2019-10-01 SURGICAL SUPPLY — 53 items
ADH SKN CLS LQ APL DERMABOND (GAUZE/BANDAGES/DRESSINGS) ×1
APL SKNCLS STERI-STRIP NONHPOA (GAUZE/BANDAGES/DRESSINGS) ×1
BAG DECANTER FOR FLEXI CONT (MISCELLANEOUS) ×2 IMPLANT
BAND INSRT 18 STRL LF DISP RB (MISCELLANEOUS) ×2
BAND RUBBER #18 3X1/16 STRL (MISCELLANEOUS) ×4 IMPLANT
BENZOIN TINCTURE PRP APPL 2/3 (GAUZE/BANDAGES/DRESSINGS) ×2 IMPLANT
BUR CARBIDE MATCH 3.0 (BURR) ×2 IMPLANT
BUR CUTTER 7.0 ROUND (BURR) ×2 IMPLANT
CANISTER SUCT 3000ML PPV (MISCELLANEOUS) ×2 IMPLANT
CARTRIDGE OIL MAESTRO DRILL (MISCELLANEOUS) ×1 IMPLANT
CLOSURE STERI-STRIP 1/4X4 (GAUZE/BANDAGES/DRESSINGS) ×1 IMPLANT
COVER WAND RF STERILE (DRAPES) ×1 IMPLANT
DERMABOND ADHESIVE PROPEN (GAUZE/BANDAGES/DRESSINGS) ×1
DERMABOND ADVANCED .7 DNX6 (GAUZE/BANDAGES/DRESSINGS) IMPLANT
DIFFUSER DRILL AIR PNEUMATIC (MISCELLANEOUS) ×2 IMPLANT
DRAPE LAPAROTOMY 100X72X124 (DRAPES) ×2 IMPLANT
DRAPE MICROSCOPE LEICA (MISCELLANEOUS) ×2 IMPLANT
DRAPE SURG 17X23 STRL (DRAPES) ×2 IMPLANT
DRSG OPSITE POSTOP 4X6 (GAUZE/BANDAGES/DRESSINGS) ×1 IMPLANT
DURAPREP 26ML APPLICATOR (WOUND CARE) ×2 IMPLANT
ELECT BLADE 4.0 EZ CLEAN MEGAD (MISCELLANEOUS) ×2
ELECT REM PT RETURN 9FT ADLT (ELECTROSURGICAL) ×2
ELECTRODE BLDE 4.0 EZ CLN MEGD (MISCELLANEOUS) IMPLANT
ELECTRODE REM PT RTRN 9FT ADLT (ELECTROSURGICAL) ×1 IMPLANT
GAUZE 4X4 16PLY RFD (DISPOSABLE) IMPLANT
GLOVE BIO SURGEON STRL SZ7 (GLOVE) IMPLANT
GLOVE BIO SURGEON STRL SZ8 (GLOVE) ×2 IMPLANT
GLOVE BIOGEL PI IND STRL 7.0 (GLOVE) IMPLANT
GLOVE BIOGEL PI INDICATOR 7.0 (GLOVE)
GOWN STRL REUS W/ TWL LRG LVL3 (GOWN DISPOSABLE) IMPLANT
GOWN STRL REUS W/ TWL XL LVL3 (GOWN DISPOSABLE) ×1 IMPLANT
GOWN STRL REUS W/TWL 2XL LVL3 (GOWN DISPOSABLE) IMPLANT
GOWN STRL REUS W/TWL LRG LVL3 (GOWN DISPOSABLE)
GOWN STRL REUS W/TWL XL LVL3 (GOWN DISPOSABLE) ×2
KIT BASIN OR (CUSTOM PROCEDURE TRAY) ×2 IMPLANT
KIT TURNOVER KIT B (KITS) ×2 IMPLANT
NDL HYPO 25X1 1.5 SAFETY (NEEDLE) ×1 IMPLANT
NDL SPNL 20GX3.5 QUINCKE YW (NEEDLE) IMPLANT
NEEDLE HYPO 25X1 1.5 SAFETY (NEEDLE) ×2 IMPLANT
NEEDLE SPNL 20GX3.5 QUINCKE YW (NEEDLE) IMPLANT
NS IRRIG 1000ML POUR BTL (IV SOLUTION) ×2 IMPLANT
OIL CARTRIDGE MAESTRO DRILL (MISCELLANEOUS) ×2
PACK LAMINECTOMY NEURO (CUSTOM PROCEDURE TRAY) ×2 IMPLANT
PAD ARMBOARD 7.5X6 YLW CONV (MISCELLANEOUS) ×6 IMPLANT
SPONGE SURGIFOAM ABS GEL SZ50 (HEMOSTASIS) ×1 IMPLANT
STRIP CLOSURE SKIN 1/2X4 (GAUZE/BANDAGES/DRESSINGS) ×2 IMPLANT
SUT VIC AB 0 CT1 18XCR BRD8 (SUTURE) ×1 IMPLANT
SUT VIC AB 0 CT1 8-18 (SUTURE) ×2
SUT VIC AB 2-0 CP2 18 (SUTURE) ×2 IMPLANT
SUT VIC AB 3-0 SH 8-18 (SUTURE) ×2 IMPLANT
TOWEL GREEN STERILE (TOWEL DISPOSABLE) ×2 IMPLANT
TOWEL GREEN STERILE FF (TOWEL DISPOSABLE) ×2 IMPLANT
WATER STERILE IRR 1000ML POUR (IV SOLUTION) ×2 IMPLANT

## 2019-10-01 NOTE — Progress Notes (Signed)
Subjective: HD#3 Overnight, no acute events reported.  This morning, patient evaluated at bedside. She notes that pain is currently well controlled but continues to have numbness/tingling in left leg. She was able to walk with physical therapy in the hallway and noted increased pain following that. She is scheduled for microdiscectomy this afternoon.   Objective:  Vital signs in last 24 hours: Vitals:   09/30/19 1646 09/30/19 2035 09/30/19 2347 10/01/19 0356  BP: 114/89 128/72 127/76 116/79  Pulse: 95 96 91 80  Resp: 20 18 18 18   Temp: 98.7 F (37.1 C) 98.8 F (37.1 C) 98.3 F (36.8 C) 97.8 F (36.6 C)  TempSrc: Oral Oral Oral Oral  SpO2: 95% 97% 99% 99%   CBC Latest Ref Rng & Units 10/01/2019 09/30/2019 09/29/2019  WBC 4.0 - 10.5 K/uL 12.5(H) 11.3(H) 15.0(H)  Hemoglobin 12.0 - 15.0 g/dL 11.5(L) 12.5 12.6  Hematocrit 36.0 - 46.0 % 36.5 38.7 39.8  Platelets 150 - 400 K/uL 240 259 273   BMP Latest Ref Rng & Units 09/30/2019 09/29/2019 09/28/2019  Glucose 70 - 99 mg/dL 276(H) 235(H) 220(H)  BUN 6 - 20 mg/dL 14 12 15   Creatinine 0.44 - 1.00 mg/dL 0.80 0.94 0.90  BUN/Creat Ratio 9 - 23 - - -  Sodium 135 - 145 mmol/L 137 136 136  Potassium 3.5 - 5.1 mmol/L 3.6 4.0 4.7  Chloride 98 - 111 mmol/L 97(L) 96(L) 100  CO2 22 - 32 mmol/L 27 28 24   Calcium 8.9 - 10.3 mg/dL 9.0 9.4 9.4   Physical Exam  Constitutional: She is oriented to person, place, and time and well-developed, well-nourished, and in no distress. No distress.  Eyes: Conjunctivae and EOM are normal.  Cardiovascular: Normal rate, regular rhythm, normal heart sounds and intact distal pulses. Exam reveals no gallop and no friction rub.  No murmur heard. Pulmonary/Chest: No respiratory distress. She has no wheezes. She has no rales.  Abdominal: Soft. Bowel sounds are normal. She exhibits no distension. There is no abdominal tenderness. There is no rebound.  Musculoskeletal:        General: No tenderness or edema. Normal range  of motion.  Neurological: She is alert and oriented to person, place, and time. No cranial nerve deficit.  Skin: Skin is warm and dry. She is not diaphoretic.     Assessment/Plan: Denise Stevens is a 54yr old community dwelling female with PMHx of diabetes mellitus, hypertension and hyperlipidemia presenting with lower back pain that radiated down her left leg pain and decreased sensation and weakness acutely worsened over the past few days.   Lumbar radiculopathy: Patient with worsening left extremity pain and weakness with MRI spine showing L4-L5 desiccation and bulging of disc, left disc herniation, lateral recess stenosis affect L5 nerve. Left knee MRI with complex lateral meniscus tear and left hip MRI without any acute abnormalities noted. Patient continues to have pain exacerbated by activity. She is scheduled for microdiscectomy with Dr. Annette Stable today. - Neurosurgery following; appreciate recommendations: microdiscectomy today - Orthopedics consulted for lateral meniscus tear: recommended for outpatient follow up - Pain control with Oxycodone IR 10mg  q3h and toradol 30mg  IV q6h - Continue PT/OT   DM II:  HbA1c 11.5 in January. She is on glipizide 5mg  qd, Januvia 100mg  qd and Victoza 1.2mg  qdat home. CBG 200-300. Suspect her current elevated readings secondary to her pain. Will continue to monitor post-op to assess need for basal coverage  - CBG monitoring - SSI (steroids, resistant)  Hypertension: - Losartan  100mg  qd - HCTZ 25mg  qd  Hyperlipidemia: - Pravastatin 40mg  qd  Depression/anxiety: - Continue bupropion 150mg qd - Continue escitalopram 20mg qd - Continue Xanax 0.25mg  tid prn  FEN/GI: Diet: Heart healthy/carb modified Electrolytes: Monitor and replete prn Fluids: None  Bowel regimen: Miralax + Senokot  VTE Prophylaxis:Lovenox40mg  daily Code status:FULL  Prior to Admission Living Arrangement: Home Anticipated Discharge Location: Home w/ HH PT vs  CIR/SNF Barriers to Discharge: Continued medical management  Dispo: Anticipated discharge in approximately 2-3 day(s).   Harvie Heck, MD  Internal Medicine, PGY-1 Pager: [phone] 10/01/2019, 6:17 AM

## 2019-10-01 NOTE — Progress Notes (Addendum)
Inpatient Diabetes Program Recommendations  AACE/ADA: New Consensus Statement on Inpatient Glycemic Control (2015)  Target Ranges:  Prepandial:   less than 140 mg/dL      Peak postprandial:   less than 180 mg/dL (1-2 hours)      Critically ill patients:  140 - 180 mg/dL   Lab Results  Component Value Date   GLUCAP 271 (H) 10/01/2019   HGBA1C 11.5 (A) 08/23/2019    Review of Glycemic Control Results for Denise Stevens, Denise Stevens (MRN [id number]) as of 10/01/2019 10:44  Ref. Range 09/30/2019 11:24 09/30/2019 16:56 09/30/2019 21:02 10/01/2019 06:39  Glucose-Capillary Latest Ref Range: 70 - 99 mg/dL 356 (H) 150 (H) 285 (H) 271 (H)   Diabetes history: Type 2 DM Outpatient Diabetes medications: Glipizide 5 mg QAM, Januvia 100 mg QD, Victoza 1.2 mg QD Current orders for Inpatient glycemic control: Novolog 0-20 units TID  Inpatient Diabetes Program Recommendations:    Consider starting Lantus 20 units QD and adding Novolog 0-5 units QHS.   Noted patient has been on steroids recently, however, unclear as it does not appear medication has been filled. Will plan to speak with patient when appropriate and anticipate possible need for insulin at discharge.   @1200 : Spoke with patient regarding outpatient diabetes management. Patient confirms outpatient medications and reports recently receiving 2 rounds of Prednisone and a steroid injection in her back.  Reviewed patient's current A1c of 11.5%. Explained what a A1c is and what it measures. Also reviewed goal A1c with patient, importance of good glucose control @ home, and blood sugar goals. Reviewed patho of DM, need for insulin, current inpatient trends compared with goals, role of pancreas, role of medications, impact of pain/hospitalization/steroids on glucose trends, vascular changes and commorbidities.  Patient has a meter and reports that blood sugar have been higher lately. Encouraged to continue checking 2-3 times per day and reviewed when to call MD.   Patient admits that she needs to limit portions and be more mindful. Denies drinking sugary beverages. Reviewed plate method, carb counting and provided teaching materials.  Briefly discussed potential need for insulin. Will need to revisit patient for additional teaching as staff were prepping for surgery. Dr Marva Panda at bedside and reviewed recommendations, even with NPO status. Will await further orders following surgery.    Thanks, Bronson Curb, MSN, RNC-OB Diabetes Coordinator [phone] (8a-5p)

## 2019-10-01 NOTE — Anesthesia Procedure Notes (Signed)
Procedure Name: Intubation Date/Time: 10/01/2019 4:18 PM Performed by: Eligha Bridegroom, CRNA Pre-anesthesia Checklist: Patient identified, Emergency Drugs available, Suction available, Patient being monitored and Timeout performed Patient Re-evaluated:Patient Re-evaluated prior to induction Oxygen Delivery Method: Circle system utilized Preoxygenation: Pre-oxygenation with 100% oxygen Induction Type: IV induction Ventilation: Mask ventilation without difficulty and Oral airway inserted - appropriate to patient size Laryngoscope Size: Glidescope Grade View: Grade II Tube type: Oral Tube size: 7.0 mm Number of attempts: 1 Airway Equipment and Method: Stylet and Video-laryngoscopy Placement Confirmation: ETT inserted through vocal cords under direct vision and positive ETCO2 Secured at: 22 cm Tube secured with: Tape Dental Injury: Teeth and Oropharynx as per pre-operative assessment

## 2019-10-01 NOTE — Transfer of Care (Signed)
Immediate Anesthesia Transfer of Care Note  Patient: Denise Stevens  Procedure(s) Performed: Left Lumbar Four-Five Microdiscectomy (Left Back)  Patient Location: PACU  Anesthesia Type:General  Level of Consciousness: awake, alert  and oriented  Airway & Oxygen Therapy: Patient Spontanous Breathing and Patient connected to face mask oxygen  Post-op Assessment: Report given to RN and Post -op Vital signs reviewed and stable  Post vital signs: Reviewed and stable  Last Vitals:  Vitals Value Taken Time  BP 98/63 10/01/19 1812  Temp    Pulse 120 10/01/19 1817  Resp 19 10/01/19 1817  SpO2 98 % 10/01/19 1817  Vitals shown include unvalidated device data.  Last Pain:  Vitals:   10/01/19 1525  TempSrc:   PainSc: 5       Patients Stated Pain Goal: 3 ([id number])  Complications: No apparent anesthesia complications

## 2019-10-01 NOTE — Op Note (Signed)
10/01/2019  5:48 PM  PATIENT:  Denise Stevens  54 y.o. female  PRE-OPERATIVE DIAGNOSIS: Bar spinal stenosis L4-5 with lumbar disc herniation L4 from the left inferior free fragment causing left L5 radiculopathy and EHL weakness  POST-OPERATIVE DIAGNOSIS:  same  PROCEDURE: Left L4-5 hemilaminectomy medial facetectomy and foraminotomy for lumbar stenosis followed by microdiscectomy L4-5 on the left  SURGEON:  Sherley Bounds, MD  ASSISTANTS: Glenford Peers, FNP  ANESTHESIA:   General  EBL: 100 ml  Total I/O In: 850 [I.V.:800; IV Piggyback:50] Out: 100 [Blood:100]  BLOOD ADMINISTERED: none  DRAINS: none  SPECIMEN:  none  INDICATION FOR PROCEDURE: This patient presented with severe left leg pain with EHL weakness. Imaging showed herniated disc at L4-5 on the left with inferior free fragment and spinal stenosis. The patient tried conservative measures without relief. Pain was debilitating. Recommended left decompressive hemilaminectomy and discectomy. Patient understood the risks, benefits, and alternatives and potential outcomes and wished to proceed.  PROCEDURE DETAILS: The patient was taken to the operating room and after induction of adequate generalized endotracheal anesthesia, the patient was rolled into the prone position on flat rolls and all pressure points were padded. The lumbar region was cleaned and then prepped with DuraPrep and draped in the usual sterile fashion. 5 cc of local anesthesia was injected and then a dorsal midline incision was made and carried down to the lumbo sacral fascia. The fascia was opened and the paraspinous musculature was taken down in a subperiosteal fashion to expose L4-5 bilaterally.  Because of her body habitus we had to use the VersaTack tractor with 120 mm blades and therefore had to take the fascia down bilaterally. Intraoperative x-ray confirmed my level at L4-5 and all agreed we were at the correct location, and then I used a combination of the  high-speed drill and the Kerrison punches to perform a hemilaminectomy, medial facetectomy, and foraminotomy at L4-5 on the left.  I drilled the top of the L5 lamina because I knew I had to get to the distal pedicle level.  The underlying yellow ligament was opened and removed in a piecemeal fashion to expose the underlying dura and exiting nerve root. I undercut the lateral recess and dissected down until I was medial to and distal to the pedicle. The nerve root was well decompressed. We then gently retracted the nerve root medially with a retractor, coagulated the epidural venous vasculature, and used a coronary dilator to remove 2 large free fragments from the lateral recess underneath the L5 nerve root adjacent to the pedicle distal to the pedicle. I then palpated with a coronary dilator along the nerve root and into the foramen to assure adequate decompression. I felt no more compression of the nerve root. I irrigated with saline solution containing bacitracin. Achieved hemostasis with bipolar cautery, , and then closed the fascia with 0 Vicryl. I closed the subcutaneous tissues with 2-0 Vicryl and the subcuticular tissues with 3-0 Vicryl. The skin was then closed with benzoin and Steri-Strips. The drapes were removed, a sterile dressing was applied.  My nurse practitioner was involved in the exposure, safe retraction of the neural elements, the disc work and the closure. the patient was awakened from general anesthesia and transferred to the recovery room in stable condition. At the end of the procedure all sponge, needle and instrument counts were correct.    PLAN OF CARE: Admit to inpatient   PATIENT DISPOSITION:  PACU - hemodynamically stable.   Delay start of Pharmacological VTE  agent (>24hrs) due to surgical blood loss or risk of bleeding:  yes

## 2019-10-01 NOTE — Anesthesia Preprocedure Evaluation (Signed)
Anesthesia Evaluation  Patient identified by MRN, date of birth, ID band Patient awake    Reviewed: Allergy & Precautions, NPO status , Patient's Chart, lab work & pertinent test results  History of Anesthesia Complications Negative for: history of anesthetic complications  Airway Mallampati: II  TM Distance: >3 FB Neck ROM: Full    Dental  (+) Teeth Intact   Pulmonary asthma , sleep apnea and Continuous Positive Airway Pressure Ventilation ,    Pulmonary exam normal        Cardiovascular hypertension, Normal cardiovascular exam     Neuro/Psych PSYCHIATRIC DISORDERS Depression negative neurological ROS     GI/Hepatic Neg liver ROS, GERD  ,  Endo/Other  diabetes, Type 2, Oral Hypoglycemic AgentsMorbid obesity  Renal/GU negative Renal ROS  negative genitourinary   Musculoskeletal negative musculoskeletal ROS (+)   Abdominal (+) + obese,   Peds  Hematology negative hematology ROS (+)   Anesthesia Other Findings   Reproductive/Obstetrics                            Anesthesia Physical Anesthesia Plan  ASA: III  Anesthesia Plan: General   Post-op Pain Management:    Induction: Intravenous  PONV Risk Score and Plan: 3 and Ondansetron, Dexamethasone, Treatment may vary due to age or medical condition and Midazolam  Airway Management Planned: Oral ETT  Additional Equipment: None  Intra-op Plan:   Post-operative Plan: Extubation in OR  Informed Consent: I have reviewed the patients History and Physical, chart, labs and discussed the procedure including the risks, benefits and alternatives for the proposed anesthesia with the patient or authorized representative who has indicated his/her understanding and acceptance.     Dental advisory given  Plan Discussed with:   Anesthesia Plan Comments:         Anesthesia Quick Evaluation

## 2019-10-02 ENCOUNTER — Telehealth: Payer: Self-pay | Admitting: Dietician

## 2019-10-02 ENCOUNTER — Encounter: Payer: Self-pay | Admitting: Dietician

## 2019-10-02 ENCOUNTER — Encounter: Payer: Self-pay | Admitting: *Deleted

## 2019-10-02 DIAGNOSIS — E1169 Type 2 diabetes mellitus with other specified complication: Secondary | ICD-10-CM

## 2019-10-02 LAB — CBC
HCT: 33.5 % — ABNORMAL LOW (ref 36.0–46.0)
Hemoglobin: 10.6 g/dL — ABNORMAL LOW (ref 12.0–15.0)
MCH: 27.2 pg (ref 26.0–34.0)
MCHC: 31.6 g/dL (ref 30.0–36.0)
MCV: 85.9 fL (ref 80.0–100.0)
Platelets: 216 10*3/uL (ref 150–400)
RBC: 3.9 MIL/uL (ref 3.87–5.11)
RDW: 13.8 % (ref 11.5–15.5)
WBC: 11.3 10*3/uL — ABNORMAL HIGH (ref 4.0–10.5)
nRBC: 0 % (ref 0.0–0.2)

## 2019-10-02 LAB — BASIC METABOLIC PANEL
Anion gap: 10 (ref 5–15)
BUN: 9 mg/dL (ref 6–20)
CO2: 30 mmol/L (ref 22–32)
Calcium: 8.9 mg/dL (ref 8.9–10.3)
Chloride: 96 mmol/L — ABNORMAL LOW (ref 98–111)
Creatinine, Ser: 0.87 mg/dL (ref 0.44–1.00)
GFR calc Af Amer: >60 mL/min (ref >60)
GFR calc non Af Amer: >60 mL/min (ref >60)
Glucose, Bld: 284 mg/dL — ABNORMAL HIGH (ref 70–99)
Potassium: 4 mmol/L (ref 3.5–5.1)
Sodium: 136 mmol/L (ref 135–145)

## 2019-10-02 LAB — GLUCOSE, CAPILLARY
Glucose-Capillary: 279 mg/dL — ABNORMAL HIGH (ref 70–99)
Glucose-Capillary: 254 mg/dL — ABNORMAL HIGH (ref 70–99)

## 2019-10-02 MED ORDER — ACETAMINOPHEN 325 MG PO TABS: 650.0000 mg | ORAL_TABLET | Freq: Four times a day (QID) | ORAL | 0 refills | Status: AC | PRN

## 2019-10-02 MED ORDER — CYCLOBENZAPRINE HCL 5 MG PO TABS: 5.0000 mg | ORAL_TABLET | Freq: Three times a day (TID) | ORAL | 0 refills | Status: DC | PRN

## 2019-10-02 MED ORDER — HYDROCODONE-ACETAMINOPHEN 5-325 MG PO TABS: 1.0000 | ORAL_TABLET | ORAL | 0 refills | Status: DC | PRN

## 2019-10-02 MED ORDER — SENNA 8.6 MG PO TABS: 1.0000 | ORAL_TABLET | Freq: Two times a day (BID) | ORAL | 0 refills | Status: DC

## 2019-10-02 MED FILL — CYCLOBENZAPRINE 5 MG TABLET: 5 | 10 days supply | Qty: 30 | Fill #0

## 2019-10-02 MED FILL — OXYCODONE-APAP 7.5-325MG: 7.5-325 | 10 days supply | Qty: 40 | Fill #0

## 2019-10-02 MED FILL — Thrombin (Recombinant) For Soln 5000 Unit: CUTANEOUS | Qty: 2 | Status: AC

## 2019-10-02 NOTE — Telephone Encounter (Signed)
This encounter was created in error - please disregard.

## 2019-10-02 NOTE — Progress Notes (Addendum)
Occupational Therapy Treatment Patient Details Name: Denise Stevens MRN: [id number] DOB: Dec 16, 1965 Today's Date: 10/02/2019    History of present illness Pt is a 54 y/o female admitted from internal medicine clinic secondary to worsening back pain. MRI of the back revealed "Lumbar spondylosis most significant at L4-5 with disc bulge and the small inferiorly migrating left paracentral disc extrusion which may be abutting the transversing nerve. No significant central canal stenosis. There is mild neural foraminal narrowing L4-5 and moderate on the left at L5-S1." Pt awaiting further neurosurgery recs.  S/P Left L4-5 hemilaminectomy medial facetectomy and foraminotomy for lumbar stenosis followed by microdiscectomy L4-5 on the left.  PMH including but not limited to HTN and DM.    OT comments  Patient seen post lumbar surgery, progressing well and pain significantly reduced. Educated on back precautions, ADL compensatory techniques, recommendations and DME.  Completing transfers with supervision, LB ADLs using figure 4 technique with supervision, and grooming at sink with supervision.  Educated on having assist moving 3:1 from commode to tub for safety, but patient reports plan to use shower chair in tub. Min cueing for back precaution adherence functionally. Updated dc plan to no follow up needed. Will follow acutely.    Follow Up Recommendations  No OT follow up;Supervision - Intermittent    Equipment Recommendations  3 in 1 bedside commode;Other (comment)    Recommendations for Other Services      Precautions / Restrictions Precautions Precautions: Back Precaution Booklet Issued: Yes (comment) Precaution Comments: reviewed precautions with pt  Required Braces or Orthoses: (no brace needed order ) Restrictions Weight Bearing Restrictions: No       Mobility Bed Mobility               General bed mobility comments: EOB upon entry, reviewed log roll technique verbally    Transfers Overall transfer level: Needs assistance Equipment used: None Transfers: Sit to/from Stand Sit to Stand: Supervision         General transfer comment: for safety    Balance Overall balance assessment: No apparent balance deficits (not formally assessed)                                         ADL either performed or assessed with clinical judgement   ADL Overall ADL's : Needs assistance/impaired     Grooming: Supervision/safety;Standing       Lower Body Bathing: Supervison/ safety;Sit to/from stand   Upper Body Dressing : Supervision/safety;Set up;Sitting   Lower Body Dressing: Supervision/safety;Sit to/from stand;Cueing for compensatory techniques;Cueing for back precautions Lower Body Dressing Details (indicate cue type and reason): able to complete figure 4 technique without assist, supervision sit to stand  Toilet Transfer: Supervision/safety;Ambulation Toilet Transfer Details (indicate cue type and reason): simulated in room, educated on use of 3:1 over commode for home due to low commode    Toileting - Clothing Manipulation Details (indicate cue type and reason): discussed technique to avoid twisting and bending  Tub/ Shower Transfer: Tub transfer;Min guard;Ambulation;Shower Scientist, research (medical) Details (indicate cue type and reason): discussed technique, pt reports having shower seat at home; simulated with min guard  Functional mobility during ADLs: Supervision/safety       Vision       Perception     Praxis      Cognition Arousal/Alertness: Awake/alert Behavior During Therapy: WFL for tasks assessed/performed Overall Cognitive Status: Within Functional  Limits for tasks assessed                                          Exercises     Shoulder Instructions       General Comments reports will have support at dc, encouraged pt to use shower chair in shower vs 3:1 OR have assistance moving 3:1 between  commode and [place] pt agreeable     Pertinent Vitals/ Pain       Pain Assessment: Faces Faces Pain Scale: Hurts a little bit Pain Location: back Pain Descriptors / Indicators: Discomfort;Operative site guarding Pain Intervention(s): Limited activity within patient's tolerance;Monitored during session;Repositioned  Home Living                                          Prior Functioning/Environment              Frequency  Min 2X/week        Progress Toward Goals  OT Goals(current goals can now be found in the care plan section)  Progress towards OT goals: Progressing toward goals  Acute Rehab OT Goals Patient Stated Goal: home  OT Goal Formulation: With patient  Plan Discharge plan needs to be updated;Frequency remains appropriate    Co-evaluation                 AM-PAC OT "6 Clicks" Daily Activity     Outcome Measure   Help from another person eating meals?: None Help from another person taking care of personal grooming?: A Little Help from another person toileting, which includes using toliet, bedpan, or urinal?: A Little Help from another person bathing (including washing, rinsing, drying)?: A Little Help from another person to put on and taking off regular upper body clothing?: A Little Help from another person to put on and taking off regular lower body clothing?: A Little 6 Click Score: 19    End of Session    OT Visit Diagnosis: Unsteadiness on feet (R26.81);Other abnormalities of gait and mobility (R26.89);Muscle weakness (generalized) (M62.81);Pain Pain - part of body: (back)   Activity Tolerance Patient tolerated treatment well   Patient Left with call bell/phone within reach;Other (comment)(seated EOB )   Nurse Communication Mobility status        Time: [id number] OT Time Calculation (min): 21 min  Charges: OT General Charges $OT Visit: 1 Visit OT Treatments $Self Care/Home Management : 8-22 mins  [place] Pager [phone] Office [phone]    Delight Stare 10/02/2019, 10:52 AM

## 2019-10-02 NOTE — Telephone Encounter (Signed)
Telephone call to Ms. Joya Gaskins today:   Steroid-Induced Hyperglycemia Prevention and Management LEYANNA MOTHERWAY is a 54 y.o. female who is supposed to be put back on steroids and will then meet criteria for [facility] glucose monitoring program (diabetes patient prescribed short course of steroids).  A/P Current Regimen  Patient not prescribed prednisone yet, but will be at some point in the near future.  Patient agrees to call an notify us when it is prescribed. We will start the protocol once notified.   Prednisone indication: for inflammation per her surgeon  s/p Left lumbar Four-Five Microdiscectomy yesterday  Home BG Monitoring  Patient does have a meter at home and does check BG at home. Meter was not supplied.  CBGs at hospital:279/254 today, , A1C prior to steroid course Lab Results  Component Value Date   HGBA1C 11.5 (A) 08/23/2019     S/Sx of hyper- or hypoglycemia: none, none  Medication Management  patient advised to take steroid in the morning when prescribed.   Additional treatment for BG control is indicated at this time.  Glipizide  Did not control blood sugars during her last steroid therapy so will add basal insulin Lantus 25 units every morning to her januvia and Victoza and have her hold the glipizide   Physician preference level per protocol: 1  Medication supply Patient will use own medication  Patient Education  Advised patient to monitor BG while on steroid therapy (at least twice daily prior to first 2 meals of the day).  Patient educated about signs/symptoms and advised to contact clinic if hyper- or hypoglycemic.  Patient did  verbalize understanding of information and regimen by repeating back topics discussed.  Follow-up when patient notifies [place] that steroids have been started.   To be determined  Cera says she feels confident in using the insulin pens. She needs updated prescription for pen needles and prescription for insulin pens. Will request.    Butch Penny Howie Rufus 3:59 PM 10/02/2019

## 2019-10-02 NOTE — Plan of Care (Signed)
Patient alert and oriented, mae's well, voiding adequate amount of urine, swallowing without difficulty, no c/o pain at time of discharge. Patient discharged home with family. Script and discharged instructions given to patient. Patient and family stated understanding of instructions given. Patient has an appointment with Dr. Jones °

## 2019-10-02 NOTE — Anesthesia Postprocedure Evaluation (Signed)
Anesthesia Post Note  Patient: Denise Stevens  Procedure(s) Performed: Left Lumbar Four-Five Microdiscectomy (Left Back)     Patient location during evaluation: PACU Anesthesia Type: General Level of consciousness: awake and alert Pain management: pain level controlled Vital Signs Assessment: post-procedure vital signs reviewed and stable Respiratory status: spontaneous breathing, nonlabored ventilation, respiratory function stable and patient connected to nasal cannula oxygen Cardiovascular status: blood pressure returned to baseline and stable Postop Assessment: no apparent nausea or vomiting Anesthetic complications: no    Last Vitals:  Vitals:   10/02/19 0503 10/02/19 0713  BP: 111/63 123/71  Pulse: 89 84  Resp: 20 18  Temp: 36.9 C 36.8 C  SpO2: 93% 96%    Last Pain:  Vitals:   10/02/19 0713  TempSrc: Oral  PainSc:                  [place] S

## 2019-10-02 NOTE — Discharge Summary (Signed)
Physician Discharge Summary  Patient ID: Denise Stevens MRN: [id number] DOB/AGE: [birth year] 54 y.o.  Admit date: 09/28/2019 Discharge date: 10/02/2019  Admission Diagnoses: Left L4-5 disc herniation with left L5 radiculopathy    Discharge Diagnoses: Same   Discharged Condition: good  Hospital Course: The patient was admitted on 09/28/2019 for pain control and ultimately taken to the operating room where the patient underwent left L4-5 microdiscectomy yesterday. The patient tolerated the procedure well and was taken to the recovery room and then to the floor in stable condition. The hospital course was routine. There were no complications. The wound remained clean dry and intact. Pt had appropriate back soreness. No complaints of leg pain or new N/T/W. The patient remained afebrile with stable vital signs, and tolerated a regular diet. The patient continued to increase activities, and pain was well controlled with oral pain medications.   Consults: None  Significant Diagnostic Studies:  Results for orders placed or performed during the hospital encounter of 09/28/19  SARS CORONAVIRUS 2 (TAT 6-24 HRS) Nasopharyngeal Nasopharyngeal Swab   Specimen: Nasopharyngeal Swab  Result Value Ref Range   SARS Coronavirus 2 NEGATIVE NEGATIVE  Surgical pcr screen   Specimen: Nasal Mucosa; Nasal Swab  Result Value Ref Range   MRSA, PCR NEGATIVE NEGATIVE   Staphylococcus aureus NEGATIVE NEGATIVE  Comprehensive metabolic panel  Result Value Ref Range   Sodium 136 135 - 145 mmol/L   Potassium 4.7 3.5 - 5.1 mmol/L   Chloride 100 98 - 111 mmol/L   CO2 24 22 - 32 mmol/L   Glucose, Bld 220 (H) 70 - 99 mg/dL   BUN 15 6 - 20 mg/dL   Creatinine, Ser 0.90 0.44 - 1.00 mg/dL   Calcium 9.4 8.9 - 10.3 mg/dL   Total Protein 7.9 6.5 - 8.1 g/dL   Albumin 4.0 3.5 - 5.0 g/dL   AST 37 15 - 41 U/L   ALT 39 0 - 44 U/L   Alkaline Phosphatase 75 38 - 126 U/L   Total Bilirubin 1.7 (H) 0.3 - 1.2 mg/dL   GFR calc non  Af Amer >60 >60 mL/min   GFR calc Af Amer >60 >60 mL/min   Anion gap 12 5 - 15  CBC  Result Value Ref Range   WBC 14.3 (H) 4.0 - 10.5 K/uL   RBC 4.84 3.87 - 5.11 MIL/uL   Hemoglobin 13.3 12.0 - 15.0 g/dL   HCT 41.1 36.0 - 46.0 %   MCV 84.9 80.0 - 100.0 fL   MCH 27.5 26.0 - 34.0 pg   MCHC 32.4 30.0 - 36.0 g/dL   RDW 14.2 11.5 - 15.5 %   Platelets 330 150 - 400 K/uL   nRBC 0.0 0.0 - 0.2 %  HIV Antibody (routine testing w rflx)  Result Value Ref Range   HIV Screen 4th Generation wRfx NON REACTIVE NON REACTIVE  Glucose, capillary  Result Value Ref Range   Glucose-Capillary 261 (H) 70 - 99 mg/dL  Basic metabolic panel  Result Value Ref Range   Sodium 136 135 - 145 mmol/L   Potassium 4.0 3.5 - 5.1 mmol/L   Chloride 96 (L) 98 - 111 mmol/L   CO2 28 22 - 32 mmol/L   Glucose, Bld 235 (H) 70 - 99 mg/dL   BUN 12 6 - 20 mg/dL   Creatinine, Ser 0.94 0.44 - 1.00 mg/dL   Calcium 9.4 8.9 - 10.3 mg/dL   GFR calc non Af Amer >60 >60 mL/min   GFR calc  Af Amer >60 >60 mL/min   Anion gap 12 5 - 15  CBC  Result Value Ref Range   WBC 15.0 (H) 4.0 - 10.5 K/uL   RBC 4.63 3.87 - 5.11 MIL/uL   Hemoglobin 12.6 12.0 - 15.0 g/dL   HCT 39.8 36.0 - 46.0 %   MCV 86.0 80.0 - 100.0 fL   MCH 27.2 26.0 - 34.0 pg   MCHC 31.7 30.0 - 36.0 g/dL   RDW 14.2 11.5 - 15.5 %   Platelets 273 150 - 400 K/uL   nRBC 0.0 0.0 - 0.2 %  Glucose, capillary  Result Value Ref Range   Glucose-Capillary 187 (H) 70 - 99 mg/dL  Glucose, capillary  Result Value Ref Range   Glucose-Capillary 314 (H) 70 - 99 mg/dL  Glucose, capillary  Result Value Ref Range   Glucose-Capillary 186 (H) 70 - 99 mg/dL  Glucose, capillary  Result Value Ref Range   Glucose-Capillary 279 (H) 70 - 99 mg/dL  CBC  Result Value Ref Range   WBC 11.3 (H) 4.0 - 10.5 K/uL   RBC 4.56 3.87 - 5.11 MIL/uL   Hemoglobin 12.5 12.0 - 15.0 g/dL   HCT 38.7 36.0 - 46.0 %   MCV 84.9 80.0 - 100.0 fL   MCH 27.4 26.0 - 34.0 pg   MCHC 32.3 30.0 - 36.0 g/dL    RDW 14.1 11.5 - 15.5 %   Platelets 259 150 - 400 K/uL   nRBC 0.0 0.0 - 0.2 %  Basic metabolic panel  Result Value Ref Range   Sodium 137 135 - 145 mmol/L   Potassium 3.6 3.5 - 5.1 mmol/L   Chloride 97 (L) 98 - 111 mmol/L   CO2 27 22 - 32 mmol/L   Glucose, Bld 276 (H) 70 - 99 mg/dL   BUN 14 6 - 20 mg/dL   Creatinine, Ser 0.80 0.44 - 1.00 mg/dL   Calcium 9.0 8.9 - 10.3 mg/dL   GFR calc non Af Amer >60 >60 mL/min   GFR calc Af Amer >60 >60 mL/min   Anion gap 13 5 - 15  Glucose, capillary  Result Value Ref Range   Glucose-Capillary 276 (H) 70 - 99 mg/dL   Comment 1 Notify RN    Comment 2 Document in Chart   Glucose, capillary  Result Value Ref Range   Glucose-Capillary 261 (H) 70 - 99 mg/dL   Comment 1 Notify RN    Comment 2 Document in Chart   Glucose, capillary  Result Value Ref Range   Glucose-Capillary 356 (H) 70 - 99 mg/dL  Glucose, capillary  Result Value Ref Range   Glucose-Capillary 150 (H) 70 - 99 mg/dL   Comment 1 Notify RN    Comment 2 Document in Chart   CBC  Result Value Ref Range   WBC 12.5 (H) 4.0 - 10.5 K/uL   RBC 4.20 3.87 - 5.11 MIL/uL   Hemoglobin 11.5 (L) 12.0 - 15.0 g/dL   HCT 36.5 36.0 - 46.0 %   MCV 86.9 80.0 - 100.0 fL   MCH 27.4 26.0 - 34.0 pg   MCHC 31.5 30.0 - 36.0 g/dL   RDW 13.8 11.5 - 15.5 %   Platelets 240 150 - 400 K/uL   nRBC 0.0 0.0 - 0.2 %  Basic metabolic panel  Result Value Ref Range   Sodium 134 (L) 135 - 145 mmol/L   Potassium 3.8 3.5 - 5.1 mmol/L   Chloride 95 (L) 98 - 111 mmol/L  CO2 27 22 - 32 mmol/L   Glucose, Bld 276 (H) 70 - 99 mg/dL   BUN 14 6 - 20 mg/dL   Creatinine, Ser 0.83 0.44 - 1.00 mg/dL   Calcium 9.0 8.9 - 10.3 mg/dL   GFR calc non Af Amer >60 >60 mL/min   GFR calc Af Amer >60 >60 mL/min   Anion gap 12 5 - 15  Glucose, capillary  Result Value Ref Range   Glucose-Capillary 285 (H) 70 - 99 mg/dL   Comment 1 Notify RN    Comment 2 Document in Chart   Glucose, capillary  Result Value Ref Range    Glucose-Capillary 271 (H) 70 - 99 mg/dL   Comment 1 Notify RN    Comment 2 Document in Chart   Glucose, capillary  Result Value Ref Range   Glucose-Capillary 147 (H) 70 - 99 mg/dL  Glucose, capillary  Result Value Ref Range   Glucose-Capillary 191 (H) 70 - 99 mg/dL  Glucose, capillary  Result Value Ref Range   Glucose-Capillary 189 (H) 70 - 99 mg/dL  CBC  Result Value Ref Range   WBC 11.3 (H) 4.0 - 10.5 K/uL   RBC 3.90 3.87 - 5.11 MIL/uL   Hemoglobin 10.6 (L) 12.0 - 15.0 g/dL   HCT 33.5 (L) 36.0 - 46.0 %   MCV 85.9 80.0 - 100.0 fL   MCH 27.2 26.0 - 34.0 pg   MCHC 31.6 30.0 - 36.0 g/dL   RDW 13.8 11.5 - 15.5 %   Platelets 216 150 - 400 K/uL   nRBC 0.0 0.0 - 0.2 %  Basic metabolic panel  Result Value Ref Range   Sodium 136 135 - 145 mmol/L   Potassium 4.0 3.5 - 5.1 mmol/L   Chloride 96 (L) 98 - 111 mmol/L   CO2 30 22 - 32 mmol/L   Glucose, Bld 284 (H) 70 - 99 mg/dL   BUN 9 6 - 20 mg/dL   Creatinine, Ser 0.87 0.44 - 1.00 mg/dL   Calcium 8.9 8.9 - 10.3 mg/dL   GFR calc non Af Amer >60 >60 mL/min   GFR calc Af Amer >60 >60 mL/min   Anion gap 10 5 - 15  Glucose, capillary  Result Value Ref Range   Glucose-Capillary 201 (H) 70 - 99 mg/dL   Comment 1 Notify RN    Comment 2 Document in Chart   Glucose, capillary  Result Value Ref Range   Glucose-Capillary 266 (H) 70 - 99 mg/dL   Comment 1 Notify RN    Comment 2 Document in Chart   Glucose, capillary  Result Value Ref Range   Glucose-Capillary 279 (H) 70 - 99 mg/dL   Comment 1 Notify RN    Comment 2 Document in Chart     DG Lumbar Spine 2-3 Views  Result Date: 10/01/2019 CLINICAL DATA:  L4/L5 micro discectomy EXAM: LUMBAR SPINE - 2-3 VIEW COMPARISON:  09/27/2019 FINDINGS: Cross-table lateral view of the lumbar spine was performed in the operating room. Surgical instrumentation is seen posterior to the L4/L5 disc space. IMPRESSION: 1. Intraoperative exam as above. Electronically Signed   By: Randa Ngo M.D.   On:  10/01/2019 19:20   MR LUMBAR SPINE WO CONTRAST  Result Date: 09/27/2019 CLINICAL DATA:  Steroid injection done at L5-S1 on the left yesterday. Postprocedural pain. Assess for procedural complication. EXAM: MRI LUMBAR SPINE WITHOUT CONTRAST TECHNIQUE: Multiplanar, multisequence MR imaging of the lumbar spine was performed. No intravenous contrast was administered. COMPARISON:  Injection  images same day. FINDINGS: Segmentation:  5 lumbar type vertebral bodies. Alignment:  Normal Vertebrae:  Normal Conus medullaris and cauda equina: Conus extends to the L1-2 level. Conus and cauda equina appear normal. Paraspinal and other soft tissues: Normal Disc levels: No abnormality of the discs at L3-4 or above. Mild facet hypertrophy but no stenosis of the canal or foramina. L4-5: Desiccation and bulging of the disc. Shallow left posterolateral disc herniation with caudal migration of a disc fragment behind L5. This results in lateral recess stenosis that could affect the left L5 nerve. Facet degeneration. Moderate multifactorial spinal stenosis at this level. L5-S1: Shallow protrusion of the disc more prominent towards the left. Mild facet degeneration. Mild narrowing of the subarticular lateral recess on the left. No finding attributable to an epidural injection today. IMPRESSION: No finding attributable to an injection today. L4-5: Shallow disc herniation with caudal migration of some disc material in the left lateral recess behind L5, narrowing the left lateral recess and potentially compressing the left L5 nerve. Facet osteoarthritis at this level also contributes to canal stenosis at the disc level. L5-S1: Shallow left posterolateral disc protrusion. Mild narrowing of the left lateral recess but no visible neural compression. Facet osteoarthritis at this level Electronically Signed   By: Nelson Chimes M.D.   On: 09/27/2019 02:46   MR KNEE LEFT WO CONTRAST  Result Date: 09/29/2019 CLINICAL DATA:  Left leg and knee  pain and numbness for approximately 2 days. No known injury. EXAM: MRI OF THE LEFT KNEE WITHOUT CONTRAST TECHNIQUE: Multiplanar, multisequence MR imaging of the knee was performed. No intravenous contrast was administered. COMPARISON:  Plain films left knee 01/08/2019. FINDINGS: MENISCI Medial meniscus:  Intact. Lateral meniscus: Complex tearing is seen throughout the posterior horn and includes a large radial component along the free edge of the central aspect of the posterior horn. LIGAMENTS Cruciates:  Intact. Collaterals:  Intact. CARTILAGE Patellofemoral: Cartilage loss is worst along the lateral patellar facet and femoral trochlea. Medial:  Frayed and irregular. Lateral:  Frayed and irregular. Joint:  Small joint effusion. Popliteal Fossa:  No Baker's cyst. Extensor Mechanism:  Intact. Bones: No fracture, stress change or worrisome lesion. Tricompartmental osteophytosis is present. Other: None. IMPRESSION: Complex tear posterior horn lateral meniscus. Tricompartmental osteoarthritis appears advanced for age. Electronically Signed   By: Inge Rise M.D.   On: 09/29/2019 13:24   MR HIP LEFT WO CONTRAST  Result Date: 09/30/2019 CLINICAL DATA:  Low back, left hip and left leg pain for approximately 2 weeks, worse over the past 2 days with numbness. No known injury. EXAM: MR OF THE LEFT HIP WITHOUT CONTRAST TECHNIQUE: Multiplanar, multisequence MR imaging was performed. No intravenous contrast was administered. COMPARISON:  CT abdomen and pelvis 06/20/2010. FINDINGS: Bones: There is no acute bony or joint abnormality. No fracture, stress change, dislocation or worrisome lesion. A few small subchondral cysts are seen in both the right and left acetabular roofs. Articular cartilage and labrum Articular cartilage:  Thinned without focal defect. Labrum: There is degenerative tearing of the superior labrum on the right and left. Joint or bursal effusion Joint effusion:  None. Bursae: Negative. Muscles and  tendons Muscles and tendons:  Intact and normal in appearance. Other findings Miscellaneous: No acute or focal abnormality. Scratch the imaged intrapelvic contents demonstrate no acute or focal abnormality. Small cystic lesion near the cervix is likely a but nabothian cyst. IMPRESSION: No acute abnormality or finding to explain the patient's symptoms. Moderate bilateral hip osteoarthritis. Electronically Signed  By: Inge Rise M.D.   On: 09/30/2019 09:20   DG INJECT DIAG/THERA/INC NEEDLE/CATH/PLC EPI/LUMB/SAC W/IMG  Result Date: 09/26/2019 CLINICAL DATA:  Lumbosacral spondylosis without myelopathy with radiculopathy. Left groin and lateral lower leg pain. L5-S1 epidural injection requested. FLUOROSCOPY TIME:  Radiation Exposure Index (as provided by the fluoroscopic device): 13.6 mGy Fluoroscopy Time:  39 seconds Number of Acquired Images:  0 PROCEDURE: The procedure, risks, benefits, and alternatives were explained to the patient. Questions regarding the procedure were encouraged and answered. The patient understands and consents to the procedure. LUMBAR EPIDURAL INJECTION: An interlaminar approach was performed on the left at L5-S1. The overlying skin was cleansed and anesthetized. A 6 inch 20 gauge epidural needle was advanced using loss-of-resistance technique. DIAGNOSTIC EPIDURAL INJECTION: Injection of Isovue-M 200 shows a good epidural pattern with spread above and below the level of needle placement, primarily in the midline. No vascular opacification is seen. THERAPEUTIC EPIDURAL INJECTION: 120 mg of Depo-Medrol mixed with 3 mL of 1% lidocaine were instilled. The procedure was well-tolerated, and the patient was discharged thirty minutes following the injection in good condition. COMPLICATIONS: None immediate. IMPRESSION: Technically successful interlaminar epidural injection on the left at L5-S1. Electronically Signed   By: Titus Dubin M.D.   On: 09/26/2019 12:41   VAS [place] LOWER EXTREMITY  VENOUS (DVT) (ONLY [facility] & [facility] 7a-7p)  Result Date: 09/20/2019  Lower Venous DVTStudy Indications: Pain.  Limitations: Body habitus. Comparison Study: Lower extremity venous duplex 04-02-18, negative. Performing Technologist: Baldwin Crown ARDMS, RVT  Examination Guidelines: A complete evaluation includes B-mode imaging, spectral Doppler, color Doppler, and power Doppler as needed of all accessible portions of each vessel. Bilateral testing is considered an integral part of a complete examination. Limited examinations for reoccurring indications may be performed as noted. The reflux portion of the exam is performed with the patient in reverse Trendelenburg.  +---------+---------------+---------+-----------+----------+--------------+ RIGHT    CompressibilityPhasicitySpontaneityPropertiesThrombus Aging +---------+---------------+---------+-----------+----------+--------------+ CFV      Full           Yes      Yes                                 +---------+---------------+---------+-----------+----------+--------------+ SFJ      Full                                                        +---------+---------------+---------+-----------+----------+--------------+ FV Prox  Full                                                        +---------+---------------+---------+-----------+----------+--------------+ FV Mid   Full                                                        +---------+---------------+---------+-----------+----------+--------------+ FV DistalFull                                                        +---------+---------------+---------+-----------+----------+--------------+  PFV      Full                                                        +---------+---------------+---------+-----------+----------+--------------+ POP      Full           Yes      Yes                                  +---------+---------------+---------+-----------+----------+--------------+ PTV      Full                                                        +---------+---------------+---------+-----------+----------+--------------+ PERO     Full                                                        +---------+---------------+---------+-----------+----------+--------------+   +---------+---------------+---------+-----------+----------+--------------+ LEFT     CompressibilityPhasicitySpontaneityPropertiesThrombus Aging +---------+---------------+---------+-----------+----------+--------------+ CFV      Full           Yes      Yes                                 +---------+---------------+---------+-----------+----------+--------------+ SFJ      Full                                                        +---------+---------------+---------+-----------+----------+--------------+ FV Prox  Full                                                        +---------+---------------+---------+-----------+----------+--------------+ FV Mid   Full                                                        +---------+---------------+---------+-----------+----------+--------------+ FV DistalFull                                                        +---------+---------------+---------+-----------+----------+--------------+ PFV      Full                                                        +---------+---------------+---------+-----------+----------+--------------+  POP      Full           Yes      Yes                                 +---------+---------------+---------+-----------+----------+--------------+ PTV      Full                                                        +---------+---------------+---------+-----------+----------+--------------+ PERO     Full                                                         +---------+---------------+---------+-----------+----------+--------------+     Summary: BILATERAL: - No evidence of deep vein thrombosis seen in the lower extremities, bilaterally.  RIGHT: - No cystic structure found in the popliteal fossa.  LEFT: - No cystic structure found in the popliteal fossa.  *See table(s) above for measurements and observations. Electronically signed by Harold Barban MD on 09/20/2019 at 7:26:37 AM.    Final     Antibiotics:  Anti-infectives (From admission, onward)   Start     Dose/Rate Route Frequency Ordered Stop   10/02/19 0030  ceFAZolin (ANCEF) IVPB 2g/100 mL premix     2 g 200 mL/hr over 30 Minutes Intravenous Every 8 hours 10/01/19 2007 10/02/19 0710   10/01/19 1645  bacitracin 50,000 Units in sodium chloride 0.9 % 500 mL irrigation  Status:  Discontinued       As needed 10/01/19 1645 10/01/19 1805   10/01/19 1515  ceFAZolin (ANCEF) 3 g in dextrose 5 % 50 mL IVPB     3 g 100 mL/hr over 30 Minutes Intravenous  Once 10/01/19 1504 10/01/19 1630      Discharge Exam: Blood pressure 123/71, pulse 84, temperature 98.2 F (36.8 C), temperature source Oral, resp. rate 18, SpO2 96 %. Neurologic: Grossly normal with EHL weakness on the left Dressing dry  Discharge Medications:   Allergies as of 10/02/2019      Reactions   Hydrocodone Nausea Only, Other (See Comments)   Patient MUST take Benadryl beforehand to tolerate   Semaglutide Anaphylaxis, Other (See Comments)   Caused severe constipation (Rybelsus)   Tape Itching   "NO PLASTIC, CLEAR TAPE"   Augmentin [amoxicillin-pot Clavulanate] Diarrhea   Codeine Itching   Lidoderm [lidocaine] Other (See Comments)   Topical patch caused a sensation of burning   Morphine And Related Itching   Tramadol Itching      Medication List    STOP taking these medications   methylPREDNISolone 4 MG Tbpk tablet Commonly known as: MEDROL DOSEPAK   oxyCODONE-acetaminophen 5-325 MG tablet Commonly known as: Percocet      TAKE these medications   Accu-Chek Guide w/Device Kit 1 each by Does not apply route 3 (three) times daily as needed.   glucose monitoring kit monitoring kit 1 each by Does not apply route as needed for other.   acetaminophen 500 MG tablet Commonly known as: TYLENOL Take 1,000 mg by mouth every 6 (six) hours as needed (for pain).   acetic  acid-hydrocortisone OTIC solution Commonly known as: VOSOL-HC Place 4 drops into both ears 3 (three) times daily. What changed:   how much to take  when to take this  reasons to take this   ALPRAZolam 0.25 MG tablet Commonly known as: XANAX Take 1 tablet (0.25 mg total) by mouth 3 (three) times daily as needed for anxiety.   buPROPion 150 MG 24 hr tablet Commonly known as: WELLBUTRIN XL Take 1 tablet (150 mg total) by mouth daily.   cyclobenzaprine 10 MG tablet Commonly known as: FLEXERIL Take 10 mg by mouth at bedtime as needed for muscle spasms (or TMJ symptoms).   cycloSPORINE 0.05 % ophthalmic emulsion Commonly known as: RESTASIS Place 1 drop into both eyes at bedtime as needed (dry eyes).   diphenhydrAMINE 25 MG tablet Commonly known as: BENADRYL Take 25 mg by mouth as needed (to tolerate Hydrocodone- take beforehand).   escitalopram 10 MG tablet Commonly known as: LEXAPRO TAKE 2 TABLETS BY MOUTH DAILY.   fluticasone 50 MCG/ACT nasal spray Commonly known as: FLONASE Place 1 spray into both nostrils 2 (two) times daily. What changed:   when to take this  reasons to take this   freestyle lancets Use as instructed   FREESTYLE TEST STRIPS test strip Generic drug: glucose blood Use as instructed   gabapentin 300 MG capsule Commonly known as: Neurontin Take one pill before bed for 3 days THEN one pill twice a day for 3 days THEN one pill three times a day What changed:   how much to take  how to take this  when to take this  additional instructions   gi cocktail Susp suspension Take 30 mLs by mouth 2 (two)  times daily as needed for indigestion. Shake well.   glipiZIDE 5 MG tablet Commonly known as: GLUCOTROL TAKE 1 TABLET (5 MG TOTAL) BY MOUTH DAILY BEFORE BREAKFAST.   HYDROcodone-acetaminophen 5-325 MG tablet Commonly known as: NORCO/VICODIN Take 1-2 tablets by mouth every 4 (four) hours as needed. What changed:   how much to take  when to take this  reasons to take this   ibuprofen 200 MG tablet Commonly known as: ADVIL Take 800 mg by mouth every 6 (six) hours as needed (for pain).   Januvia 100 MG tablet Generic drug: sitaGLIPtin TAKE 1 TABLET (100 MG TOTAL) BY MOUTH DAILY. What changed: See the new instructions.   losartan-hydrochlorothiazide 100-25 MG tablet Commonly known as: HYZAAR Take 1 tablet by mouth daily.   metaxalone 800 MG tablet Commonly known as: SKELAXIN TAKE 1 TABLET (800 MG TOTAL) BY MOUTH 3 (THREE) TIMES DAILY AS NEEDED FOR MUSCLE SPASMS.   methocarbamol 500 MG tablet Commonly known as: ROBAXIN Take 1 tablet (500 mg total) by mouth 2 (two) times daily.   pantoprazole 40 MG tablet Commonly known as: PROTONIX Take 1 tablet (40 mg total) by mouth 2 (two) times daily before a meal.   pravastatin 40 MG tablet Commonly known as: PRAVACHOL Take 1 tablet (40 mg total) by mouth daily.   Unifine Pentips 31G X 5 MM Misc Generic drug: Insulin Pen Needle USE TO INJECT VICTOZA DAILY What changed: See the new instructions.   Victoza 18 MG/3ML Sopn Generic drug: liraglutide Inject 0.2 mLs (1.2 mg total) into the skin daily.       Disposition: Home   Final Dx: Left L4-5 microdiscectomy  Discharge Instructions    Call MD for:  difficulty breathing, headache or visual disturbances   Complete by: As directed  Call MD for:  persistant nausea and vomiting   Complete by: As directed    Call MD for:  redness, tenderness, or signs of infection (pain, swelling, redness, odor or green/yellow discharge around incision site)   Complete by: As directed     Call MD for:  severe uncontrolled pain   Complete by: As directed    Call MD for:  temperature >100.4   Complete by: As directed    Diet - low sodium heart healthy   Complete by: As directed    Increase activity slowly   Complete by: As directed    Remove dressing in 48 hours   Complete by: As directed       Follow-up Information    Leandrew Koyanagi, MD Follow up in 4 week(s).   Specialty: Orthopedic Surgery Why: to discuss left lateral meniscus tear Contact information: [place] [address] [phone]        Eustace Moore, MD. Schedule an appointment as soon as possible for a visit in 2 week(s).   Specialty: Neurosurgery Contact information: [address] [place] [address] [place] [address] [phone]            Signed: Eustace Moore 10/02/2019, 7:57 AM

## 2019-10-02 NOTE — Discharge Instructions (Signed)
Wound Care Keep incision covered and dry for 72 hrs. You can take a shower Do not put any creams, lotions, or ointments on incision. Leave steri-strips on back.  They will fall off by themselves. Activity Walk each and every day, increasing distance each day. No lifting greater than 5 lbs.  Avoid bending, arching, or twisting. No driving for 1 week; may ride as a passenger locally.  Diet Resume your normal diet.  Return to Work Will be discussed at you follow up appointment. Call Your Doctor If Any of These Occur Redness, drainage, or swelling at the wound.  Temperature greater than 101 degrees. Severe pain not relieved by pain medication. Incision starts to come apart. Follow Up Appt Call today for appointment in 1-2 weeks [id number]) or for problems.  If you have any hardware placed in your spine, you will need an x-ray before your appointment.

## 2019-10-02 NOTE — Progress Notes (Signed)
Inpatient Diabetes Program Recommendations  AACE/ADA: New Consensus Statement on Inpatient Glycemic Control (2015)  Target Ranges:  Prepandial:   less than 140 mg/dL      Peak postprandial:   less than 180 mg/dL (1-2 hours)      Critically ill patients:  140 - 180 mg/dL   Lab Results  Component Value Date   GLUCAP 279 (H) 10/02/2019   HGBA1C 11.5 (A) 08/23/2019    Review of Glycemic Control Results for TRESSIA, LABRUM (MRN [id number]) as of 10/02/2019 09:58  Ref. Range 10/01/2019 13:44 10/01/2019 15:46 10/01/2019 18:10 10/01/2019 21:15 10/02/2019 06:26  Glucose-Capillary Latest Ref Range: 70 - 99 mg/dL 191 (H) 189 (H) 201 (H) 266 (H) 279 (H)   Inpatient Diabetes Program Recommendations:    Reviewed A1c again of 11.5 and reviewed ways to limit and count carbohydrate and sugar. Educated patient on insulin pen use at home in case placed on insulin. Reviewed contents of insulin flexpen starter kit. Reviewed all steps if insulin pen including attachment of needle, 2-unit air shot, dialing up dose, giving injection, removing needle, disposal of sharps, storage of unused insulin, disposal of insulin etc. Patient able to provide successful return demonstration. Also reviewed troubleshooting with insulin pen.  Patient had good questions regarding nutrition and plans to decrease carbohydrates and sugary drinks.  Thank you, Nani Gasser. Dung Salinger, RN, MSN, CDE  Diabetes Coordinator Inpatient Glycemic Control Team Team Pager [phone] (8am-5pm) 10/02/2019 10:05 AM

## 2019-10-02 NOTE — Consult Note (Signed)
   [facility] CM Inpatient Consult   10/02/2019  LILYTH MONTELLO 1965-09-05 [id number]   Patient is currently a pending active status in [place] Management for post hospital follow up with [facility] plan.  Patient will be followed by a [place] Coordinator for support and benefits needs..  Our community based plan of care will focus on disease management and community resource support.     Spoke with patient via hospital bedside telephone, HIPAA verified, and regarding [place] Management for follow up needs. Patient will receive a post hospital call and will be evaluated for assessments and disease process education.    Plan: Information given to patient regarding Joylene Draft, RN Care Coordinator is currently assigned for post hospital out reach. Patient graciously agreed to follow up.  Of note, [facility] Care Management services does not replace or interfere with any services that are needed or arranged by inpatient [facility] care management team.  For additional questions or referrals please contact:  Natividad Brood, RN BSN [facility] Liaison  [phone] business mobile phone Toll free office [phone]  Fax number: [phone] [email or web address]

## 2019-10-02 NOTE — Care Management (Signed)
Spoke w patient and PT at bedside. She states that she will follow up with surgeon and discuss from there future outpatient PT OT.  DME to be provided by unit staff from [facility]stock prior to discharge.

## 2019-10-02 NOTE — Progress Notes (Signed)
Subjective: HD#3 POD1 Overnight, no acute events reported.  This morning, patient evaluated at bedside. She notes that she is doing much better this morning. She notes pain is under control and her numbness is significantly improved.   Objective:  Vital signs in last 24 hours: Vitals:   10/01/19 1952 10/01/19 2333 10/02/19 0503 10/02/19 0713  BP: 126/88 (!) 120/53 111/63 123/71  Pulse: (!) 107 (!) 106 89 84  Resp: 20 20 20 18   Temp: 98.7 F (37.1 C) 98.7 F (37.1 C) 98.4 F (36.9 C) 98.2 F (36.8 C)  TempSrc: Oral Oral Oral Oral  SpO2: 100% 96% 93% 96%   CBC Latest Ref Rng & Units 10/02/2019 10/01/2019 09/30/2019  WBC 4.0 - 10.5 K/uL 11.3(H) 12.5(H) 11.3(H)  Hemoglobin 12.0 - 15.0 g/dL 10.6(L) 11.5(L) 12.5  Hematocrit 36.0 - 46.0 % 33.5(L) 36.5 38.7  Platelets 150 - 400 K/uL 216 240 259   BMP Latest Ref Rng & Units 10/02/2019 10/01/2019 09/30/2019  Glucose 70 - 99 mg/dL 284(H) 276(H) 276(H)  BUN 6 - 20 mg/dL 9 14 14   Creatinine 0.44 - 1.00 mg/dL 0.87 0.83 0.80  BUN/Creat Ratio 9 - 23 - - -  Sodium 135 - 145 mmol/L 136 134(L) 137  Potassium 3.5 - 5.1 mmol/L 4.0 3.8 3.6  Chloride 98 - 111 mmol/L 96(L) 95(L) 97(L)  CO2 22 - 32 mmol/L 30 27 27   Calcium 8.9 - 10.3 mg/dL 8.9 9.0 9.0   Physical Exam  Constitutional: She is oriented to person, place, and time and well-developed, well-nourished, and in no distress. No distress.  Eyes: Conjunctivae and EOM are normal.  Cardiovascular: Normal rate, regular rhythm, normal heart sounds and intact distal pulses. Exam reveals no gallop and no friction rub.  No murmur heard. Pulmonary/Chest: No respiratory distress. She has no wheezes. She has no rales.  Abdominal: Soft. Bowel sounds are normal. She exhibits no distension. There is no abdominal tenderness. There is no rebound.  Musculoskeletal:        General: No tenderness or edema. Normal range of motion.  Neurological: She is alert and oriented to person, place, and time. No cranial  nerve deficit.  Strength 5/5 in bilateral lower extremities  Sensation significantly improved in distribution of the common peroneal nerve   Skin: Skin is warm and dry. She is not diaphoretic.     Assessment/Plan: [name] is a 54yr old community dwelling female with PMHx of diabetes mellitus, hypertension and hyperlipidemia presenting with lower back pain that radiated down her left leg pain and decreased sensation and weakness acutely worsened over the past few days.   Lumbar radiculopathy s/p L4-5 microdiscectomy: Patient with worsening left extremity pain and weakness with MRI spine showing L4-L5 desiccation and bulging of disc, left disc herniation, lateral recess stenosis affect L5 nerve. Patient s/p L4-5 microdiscectomy. She is doing well this morning and notes that her pain and numbness/tingling is significantly improved. Patient evaluated by neurosurgery and safe for discharge to home.  - Patient to follow up with neurosurgery in 2 weeks  - Patient to follow up in clinic with PCP - Orthopedics consulted for lateral meniscus tear on MRI knee: recommended for outpatient follow up in 2-4 weeks - Pain control with Oxycodone IR 10mg , flexeril 5mg  and tylenol 650mg    DM II:  HbA1c 11.5 in January. She is on glipizide 5mg  qd, Januvia 100mg  qd and Victoza 1.2mg  qdat home. CBG's have improved since surgery. Suspect her elevated CBG's reactive to pain. Patient to  resume home medications at discharge and follow up with PCP for further diabetes management.  - Resume home medications on discharge - Follow up with PCP  Hypertension: - Losartan 100mg  qd - HCTZ 25mg  qd  Hyperlipidemia: - Pravastatin 40mg  qd  Depression/anxiety: - Continue bupropion 150mg qd - Continue escitalopram 20mg qd - Continue Xanax 0.25mg  tid prn  FEN/GI: Diet: Heart healthy/carb modified Electrolytes: Monitor and replete prn Fluids: None  Bowel regimen: Miralax + Senokot  VTE  Prophylaxis:Lovenox40mg  daily Code status:FULL  Prior to Admission Living Arrangement: Home Anticipated Discharge Location: Home  Barriers to Discharge: None Dispo: Anticipated discharge today.   Harvie Heck, MD  Internal Medicine, PGY-1 Pager: [phone] 10/02/2019, 8:19 AM

## 2019-10-02 NOTE — Progress Notes (Signed)
Physical Therapy Treatment Patient Details Name: Denise Stevens MRN: [id number] DOB: 1965-10-24 Today's Date: 10/02/2019    History of Present Illness Pt is a 54 y/o female admitted from internal medicine clinic secondary to worsening back pain. MRI of the back revealed "Lumbar spondylosis most significant at L4-5 with disc bulge and the small inferiorly migrating left paracentral disc extrusion which may be abutting the transversing nerve. No significant central canal stenosis. There is mild neural foraminal narrowing L4-5 and moderate on the left at L5-S1." Pt awaiting further neurosurgery recs.  S/P Left L4-5 hemilaminectomy medial facetectomy and foraminotomy for lumbar stenosis followed by microdiscectomy L4-5 on the left.  PMH including but not limited to HTN and DM.     PT Comments    Pt progressing well with post-op mobility. She was able to demonstrate transfers and ambulation with gross modified independence. Education was reinforced on precautions, appropriate activity progression, stair negotiation, positioning recommendations, and car transfer. Will continue to follow.      Follow Up Recommendations  No PT follow up;Supervision - Intermittent     Equipment Recommendations  Rolling walker with 5" wheels;3in1 (PT)    Recommendations for Other Services       Precautions / Restrictions Precautions Precautions: Back Precaution Booklet Issued: Yes (comment) Precaution Comments: reviewed precautions with pt  Required Braces or Orthoses: (no brace needed order ) Restrictions Weight Bearing Restrictions: No    Mobility  Bed Mobility Overal bed mobility: Modified Independent             General bed mobility comments: Pt able to complete without assistance. HOB flat and rails lowered to simulate home environment.   Transfers Overall transfer level: Modified independent Equipment used: Rolling walker (2 wheeled) Transfers: Sit to/from Stand Sit to Stand: Supervision         General transfer comment: Pt demonstrated proper hand placement on seated surface for safety. No assist required.   Ambulation/Gait Ambulation/Gait assistance: Modified independent (Device/Increase time) Gait Distance (Feet): 275 Feet Assistive device: Rolling walker (2 wheeled) Gait Pattern/deviations: Step-to pattern;Decreased step length - right;Decreased stance time - left;Decreased stride length;Antalgic Gait velocity: decreased Gait velocity interpretation: <1.8 ft/sec, indicate of risk for recurrent falls General Gait Details: No unsteadiness or LOB noted. Pt ambulating well with the RW and reports feeling more comfortable with BUE support. Mildly antalgic due to LLD (L shorter than R).    Stairs Stairs: Yes Stairs assistance: Min guard;Supervision Stair Management: One rail Right;Step to pattern;Forwards Number of Stairs: 10 General stair comments: VC's for sequencing and general safety as pt negotiated stairs.    Wheelchair Mobility    Modified Rankin (Stroke Patients Only)       Balance Overall balance assessment: Mild deficits observed, not formally tested                                          Cognition Arousal/Alertness: Awake/alert Behavior During Therapy: WFL for tasks assessed/performed Overall Cognitive Status: Within Functional Limits for tasks assessed                                        Exercises      General Comments General comments (skin integrity, edema, etc.): reports will have support at dc, encouraged pt to use shower chair in shower vs  3:1 OR have assistance moving 3:1 between commode and [place] pt agreeable       Pertinent Vitals/Pain Pain Assessment: Faces Faces Pain Scale: Hurts a little bit Pain Location: back Pain Descriptors / Indicators: Discomfort;Operative site guarding Pain Intervention(s): Monitored during session;Repositioned    Home Living                      Prior  Function            PT Goals (current goals can now be found in the care plan section) Acute Rehab PT Goals Patient Stated Goal: home  PT Goal Formulation: With patient Time For Goal Achievement: 10/12/19 Potential to Achieve Goals: Good Progress towards PT goals: Progressing toward goals    Frequency    Min 5X/week      PT Plan Frequency needs to be updated    Co-evaluation              AM-PAC PT "6 Clicks" Mobility   Outcome Measure  Help needed turning from your back to your side while in a flat bed without using bedrails?: None Help needed moving from lying on your back to sitting on the side of a flat bed without using bedrails?: None Help needed moving to and from a bed to a chair (including a wheelchair)?: None Help needed standing up from a chair using your arms (e.g., wheelchair or bedside chair)?: None Help needed to walk in hospital room?: A Little Help needed climbing 3-5 steps with a railing? : A Little 6 Click Score: 22    End of Session Equipment Utilized During Treatment: Gait belt Activity Tolerance: Patient tolerated treatment well Patient left: in bed;with call bell/phone within reach Nurse Communication: Mobility status PT Visit Diagnosis: Other abnormalities of gait and mobility (R26.89);Pain Pain - Right/Left: Left Pain - part of body: (back)     Time: [id number] PT Time Calculation (min) (ACUTE ONLY): 33 min  Charges:  $Gait Training: 23-37 mins                     Rolinda Roan, PT, DPT Acute Rehabilitation Services Pager: [phone] Office: [phone]    Thelma Comp 10/02/2019, 12:51 PM

## 2019-10-03 ENCOUNTER — Other Ambulatory Visit: Payer: Self-pay | Admitting: *Deleted

## 2019-10-03 ENCOUNTER — Telehealth: Payer: Self-pay | Admitting: Dietician

## 2019-10-03 ENCOUNTER — Encounter: Payer: Self-pay | Admitting: *Deleted

## 2019-10-03 MED ORDER — INSULIN PEN NEEDLE 32G X 4 MM MISC: 3 refills | Status: DC

## 2019-10-03 MED ORDER — LANTUS SOLOSTAR 100 UNIT/ML ~~LOC~~ SOPN: PEN_INJECTOR | SUBCUTANEOUS | 11 refills | Status: DC

## 2019-10-03 NOTE — Telephone Encounter (Signed)
Email exchange with patient today after sending her an email copy of yesterday's prevention of steroid induced hyperglycemia note:   "So my bs was 296 this morning. I took a glip and I've taken [place] will take my Victozia shortly. Can I take 1.2 since I've not had it since before Wednesday because of being admitted? Do need to take a lower dose I guess is what I'm asking?  No I don't mind at all Denise Stevens."  She was advised to restart at 1.2 Victoza daily

## 2019-10-03 NOTE — Patient Outreach (Signed)
[facility]) Care Management  10/03/2019  Denise Stevens 1966/06/25 [id number]  Transition of care call/case closure   Referral received:10/01/19 Initial outreach:10/03/19 Insurance: [place] [employer]    Subjective: Initial successful telephone call to patient's preferred number in order to complete transition of care assessment; 2 HIPAA identifiers verified. Explained purpose of call and completed transition of care assessment.  Denise Stevens states she is doing a little better, states she does not having sciatia pain and numbness improved.  She denies post-operative problems, says surgical incision dressing in place  states surgical pain well managed with prescribed medications. She reports not having much of an appetite but trying to make sure she is eating something,discussed food options with soft foods with balancing protein carbohydrates.  She discussed having a loose tooth after surgery and she has dental visit on tomorrow , she reports she has discussed with dentist recent back  surgery and concern regarding positioning during visit states she has been assured she can be evaluated without lying her back, patient plans  to notify surgeon of dental visit and discuss any other recommendation/concern  for positioning at dental appointment.  She denies bowel or bladder problems, had bowel movement on today.  Patient son with her and  assisting with her  recovery.  Discussed with patient ongoing  health issues of hypertension and Diabetes, she discussed being a participate in active health management chronic condition disease management program. She discussed being aware of her diabetes A1c level being elevated to 11.5, states she fell off the wagon but is working toward getting it back under control. She is discussed support receiving  by Registered Nutritionist Diabetes Coordinator at PCP office that she is communicating with . She reports blood sugar of 296 this morning and follow up with  PCP office she reports checking blood sugar twice daily and has meter and supplies on hand.  Patient discussed possibly of having to go on steroids again for inflammation  she will be in contact with her neurosurgeon on tomorrow as she was explained by surgeon day #3 may be the worst day and she may need steroids again. She has been in contact with Registered Dietician/PCP office regarding concerns of hyperglycemia if on steroids again and new instruction received.  .  Patient report tolerating mobility in home using her rolling walker, she discussed follow up visit also with orthopedic regarding left lateral meniscus tear.  She does  have the hospital indemnity , provided contact information to [facility] as she will need to file a claim.Patient states that she has began paperwork process for FMLA. She discussed being unsure of the amount of PAL time she has, discussed recent use of PAL in checking on her mother, reviewed locating information on sharepoint.She discussed coworkers donating PAL.  Discussed contact benefit office with question related to benefits, verified she has contact number.  She uses a [facility]      Objective:  Denise Stevens  was hospitalized at [facility] from 2/19-2/23/21 for  Lumbar L4-5 Microdiscectomy  Comorbidities include: Hypertension, Diabetes A1c, 11.5, Hyperlipidemia. Left meniscus tear.   She was discharged to home on 2/23/21without need for home health services, she received Rolling walker and bedside commode at discharge.    Assessment:  Patient voices good understanding of all discharge instructions.  See transition of care flowsheet for assessment details.   Plan:  Reviewed hospital discharge diagnosis of  Lumbar L4-5 Microdiscectomy    and discharge treatment plan using hospital  discharge instructions, assessing medication adherence, reviewing problems requiring provider notification, and discussing the importance of follow up with surgeon,  primary care provider and/or specialists as directed. Reviewed [facility] healthy lifestyle program information to receive discounted premium for  2022  Step 1: Get annual physical between August 09, 2018 and February 07, 2020; Step 2: Complete your health assessment between August 10, 2019 and April 09, 2020 at [email or web address] Step 3:Identify your current health status and complete the corresponding action step between January 1, and April 09, 2020.   Using [place] website, verified that patient is an active participate in [facility]'s Active Health Management chronic disease management program.    No ongoing care management needs identified so will close case to [place] Management services and route successful outreach letter with [place] Management pamphlet and 24 Hour Nurse Line Magnet to [place] Management clinical pool to be mailed to patient's home address.  Thanked patient for their services to [facility].  Joylene Draft, RN, BSN  [place] Management Coordinator  [phone]- Mobile [phone]- Toll Free Main Office

## 2019-10-03 NOTE — Telephone Encounter (Signed)
done

## 2019-10-04 MED FILL — AMOXICILLIN 875 MG TABS: 875 | 7 days supply | Qty: 14 | Fill #0

## 2019-10-05 ENCOUNTER — Encounter: Payer: Self-pay | Admitting: Internal Medicine

## 2019-10-05 NOTE — Progress Notes (Signed)
Denise Stevens and I have been communicating via text.  After her surgery, she informed me that the anesthesiologist had hit her front tooth and that she cannot eat or chew with it and that it is painful. She first noted it on the 24th when trying to eat breakfast.  She scheduled a dental appointment on 25 February and afterwards, texted me that the front tooth is cracked and she has an appointment with a periodontist on Monday, March 1.  The tooth is going to have to be removed and is going to be extpensive procedure.

## 2019-10-11 ENCOUNTER — Encounter: Payer: Self-pay | Admitting: Internal Medicine

## 2019-10-11 ENCOUNTER — Ambulatory Visit (INDEPENDENT_AMBULATORY_CARE_PROVIDER_SITE_OTHER): Payer: 59 | Admitting: Internal Medicine

## 2019-10-11 DIAGNOSIS — E1169 Type 2 diabetes mellitus with other specified complication: Secondary | ICD-10-CM | POA: Diagnosis not present

## 2019-10-11 DIAGNOSIS — Z79891 Long term (current) use of opiate analgesic: Secondary | ICD-10-CM | POA: Diagnosis not present

## 2019-10-11 DIAGNOSIS — G8929 Other chronic pain: Secondary | ICD-10-CM | POA: Diagnosis not present

## 2019-10-11 DIAGNOSIS — K08409 Partial loss of teeth, unspecified cause, unspecified class: Secondary | ICD-10-CM

## 2019-10-11 DIAGNOSIS — Z7984 Long term (current) use of oral hypoglycemic drugs: Secondary | ICD-10-CM

## 2019-10-11 DIAGNOSIS — Z791 Long term (current) use of non-steroidal anti-inflammatories (NSAID): Secondary | ICD-10-CM | POA: Diagnosis not present

## 2019-10-11 DIAGNOSIS — Z9889 Other specified postprocedural states: Secondary | ICD-10-CM

## 2019-10-11 DIAGNOSIS — Z79899 Other long term (current) drug therapy: Secondary | ICD-10-CM

## 2019-10-11 DIAGNOSIS — Z981 Arthrodesis status: Secondary | ICD-10-CM

## 2019-10-11 DIAGNOSIS — Z6841 Body Mass Index (BMI) 40.0 and over, adult: Secondary | ICD-10-CM | POA: Diagnosis not present

## 2019-10-11 DIAGNOSIS — I10 Essential (primary) hypertension: Secondary | ICD-10-CM

## 2019-10-11 DIAGNOSIS — M25552 Pain in left hip: Secondary | ICD-10-CM

## 2019-10-11 MED ORDER — INSULIN DEGLUDEC 100 UNIT/ML ~~LOC~~ SOPN: 25.0000 [IU] | PEN_INJECTOR | Freq: Every day | SUBCUTANEOUS | 3 refills | Status: DC

## 2019-10-11 MED FILL — TRESIBA FLEXTOUCH 100 UNITS: 100 | 60 days supply | Qty: 15 | Fill #0

## 2019-10-11 NOTE — Patient Instructions (Signed)
1. Stop Glipizide 2. Start Tresiba 25 units once a day 3. See Butch Penny in 1-2 weeks 4. Looking into Dexcom CGM

## 2019-10-12 ENCOUNTER — Other Ambulatory Visit: Payer: Self-pay | Admitting: Internal Medicine

## 2019-10-12 MED ORDER — LANTUS SOLOSTAR 100 UNIT/ML ~~LOC~~ SOPN: 25.0000 [IU] | PEN_INJECTOR | Freq: Every day | SUBCUTANEOUS | 1 refills | Status: DC

## 2019-10-12 MED FILL — LANTUS SOLOSTAR 100 UNITS/M: 100 | 60 days supply | Qty: 15 | Fill #0

## 2019-10-12 NOTE — Assessment & Plan Note (Signed)
This problem is chronic and stable.  She has pain in her left hip but it feels like it is bony pain or muscular pain and not neurologic pain.  She has numbness on her left toes and the top of the foot.  She feels that her toe is weak trying to move it upwards.  Heat makes the numbness feel a little bit better.  Her neurosurgeon has said it might take up to 4 months to see if the numbness and weakness improves.  She has seen her surgeon, Dr. Ronnald Ramp, as postop and has another appointment soon.  She is currently using ibuprofen, acetaminophen, Flexeril, gabapentin, and oxycodone (although the oxycodone is primarily for mouth pain) to control pain.  She is using a walker to ambulate.  She has been encouraged to walk as part of her postop treatment course.  She is tachycardic today which is regular on radial pulse.  She denies any signs or symptoms of DVT or PE.  I do not think her tachycardia is from a PE but rather pain and having to walk from the [facility] to [facility].  PLAN : F/U Dr Ronnald Ramp' recs

## 2019-10-12 NOTE — Progress Notes (Signed)
   Subjective:    Patient ID: Denise Stevens, female    DOB: Jan 04, 1966, 54 y.o.   MRN: [id number]  HPI  CHASTELYN RENNARD is here for HFU after microdiscectomy left L4-5. Please see the A&P for the status of the pt's chronic medical problems.  ROS : per ROS section and in problem oriented charting. All other systems are negative.  PMHx, Soc hx, and / or Fam hx : Right upper tooth fracture during extubation and had to be removed.  Review of Systems  HENT:       Gum swollen and painful  Respiratory: Negative for shortness of breath.   Cardiovascular: Negative for chest pain.  Musculoskeletal:       No calf tenderness or swelling  Skin:       Positive itching around the suture and tape Positive bruising about 5 cm in diameter and inferior and lateral to the left of the incision       Objective:   Physical Exam Constitutional:      Appearance: Normal appearance.  HENT:     Head: Normocephalic and atraumatic.     Right Ear: External ear normal.     Left Ear: External ear normal.     Nose: Nose normal.     Mouth/Throat:     Comments: Missing right upper front tooth Eyes:     Extraocular Movements: Extraocular movements intact.     Conjunctiva/sclera: Conjunctivae normal.  Cardiovascular:     Comments: Sinus tach by radial pulse Pulmonary:     Effort: Pulmonary effort is normal.  Neurological:     General: No focal deficit present.     Mental Status: She is alert. Mental status is at baseline.  Psychiatric:        Mood and Affect: Mood normal.        Behavior: Behavior normal.        Thought Content: Thought content normal.        Judgment: Judgment normal.       Assessment & Plan:

## 2019-10-12 NOTE — Assessment & Plan Note (Signed)
This problem is chronic and uncontrolled. She has lost weight since her surgery.  In the hospital, she was told she had left knee meniscus injury along with some abnormalities of her left hip.  She understands her weight is contributing to her musculoskeletal issues.  She is still processing the best path forward to address this.  Wt Readings from Last 3 Encounters:  10/11/19 (!) 308 lb (139.7 kg)  09/28/19 (!) 310 lb 9.6 oz (140.9 kg)  01/26/19 (!) 323 lb (146.5 kg)   Plan : follow

## 2019-10-12 NOTE — Assessment & Plan Note (Signed)
This problem is chronic and stable.  She is on losartan 100 and HCTZ 25 without any side effects to these medications.  Her blood pressure is at goal.  PLAN:  Cont current meds

## 2019-10-12 NOTE — Assessment & Plan Note (Signed)
This problem is chronic and uncontrolled.  Most recent A1c was 11.5.  Currently taking Victoza 1.2 and Januvia 100 along with glipizide in the morning.  She has not started the Lantus 25.  She is ready to start insulin and is motivated to get her diabetes under control.  I discussed her management with Butch Penny, our diabetes educator and nutritionist.  We discussed the difference between basal and bolus insulin.  She is agreed to start basal but not bolus insulin.  We are going to start degludec insulin 25 units once a day.  I will continue the liraglutide and the Januvia but asked that she stop the glipizide.  She asked about a Dexcom continuous glucose monitor and Butch Penny will investigate whether her insurance will cover it at this time.  Butch Penny will contact her in 1 to 2 weeks to see how she is doing on the degludec.  PLAN  Stop glipizide Start degludec 25 units once a day Continue Januvia Continue liraglutide

## 2019-10-16 ENCOUNTER — Telehealth: Payer: Self-pay | Admitting: Dietician

## 2019-10-16 DIAGNOSIS — Z6841 Body Mass Index (BMI) 40.0 and over, adult: Secondary | ICD-10-CM | POA: Insufficient documentation

## 2019-10-16 NOTE — Telephone Encounter (Signed)
Called patient per Dr. Lynnae January to schedule an appointment after patient starts insulin. She intends to pick up the insulin today and start it tomorrow. She states her blood sugar fasting today was 259. An appointment with scheduled for 1 week. Patient to bring meter and medications. Storage of insulin pens reviewed on phone today.

## 2019-10-25 ENCOUNTER — Encounter: Payer: Self-pay | Admitting: Dietician

## 2019-10-25 ENCOUNTER — Ambulatory Visit (INDEPENDENT_AMBULATORY_CARE_PROVIDER_SITE_OTHER): Payer: 59 | Admitting: Dietician

## 2019-10-25 ENCOUNTER — Encounter: Payer: 59 | Admitting: Internal Medicine

## 2019-10-25 DIAGNOSIS — Z713 Dietary counseling and surveillance: Secondary | ICD-10-CM

## 2019-10-25 DIAGNOSIS — E119 Type 2 diabetes mellitus without complications: Secondary | ICD-10-CM | POA: Diagnosis not present

## 2019-10-25 NOTE — Patient Instructions (Signed)
Sent via MyChart

## 2019-10-25 NOTE — Progress Notes (Signed)
Diabetes Self-Management Education  Visit Type: First/Initial  Appt. Start Time: 1020 Appt. End Time: 1100  10/25/2019  [name], identified by name and date of birth, is a 54 y.o. female with a diagnosis of Diabetes: Type 2.   ASSESSMENT  Her weight is trending down, her a1c was trending up.  Blood sugars reported today are: 143 fasting today (in order withy most recent: 215/132/187/337/338/281/259/240/192/220/280/278/133/197 7 day average 230(6 readings) 14 day average 240(9 readings) 30 day average 225 (18 readings)  Lab Results  Component Value Date   HGBA1C 11.5 (A) 08/23/2019   HGBA1C 8.9 (A) 03/06/2019   HGBA1C 10.5 (A) 05/22/2018   HGBA1C 6.9 11/29/2017   HGBA1C 7.7 07/21/2017     Wt Readings from Last 10 Encounters:  10/11/19 (!) 308 lb (139.7 kg)  09/28/19 (!) 310 lb 9.6 oz (140.9 kg)  01/26/19 (!) 323 lb (146.5 kg)  01/08/19 (!) 314 lb (142.4 kg)  05/29/18 (!) 324 lb 12.8 oz (147.3 kg)  05/22/18 (!) 322 lb 4.8 oz (146.2 kg)  01/12/18 (!) 323 lb 11.2 oz (146.8 kg)  01/10/18 (!) 318 lb (144.2 kg)  07/21/17 (!) 317 lb 3.2 oz (143.9 kg)  07/05/17 (!) 322 lb 14.4 oz (146.5 kg)     Diabetes Self-Management Education - 10/25/19 1300      Visit Information   Visit Type  First/Initial      Initial Visit   Diabetes Type  Type 2    Are you currently following a meal plan?  Yes    What type of meal plan do you follow?  --   trying to limit carbs and drink sugar free beverages/water   Are you taking your medications as prescribed?  Yes      Pre-Education Assessment   Patient understands using medications safely.  Needs Instruction    Patient understands monitoring blood glucose, interpreting and using results  Needs Instruction    Patient understands prevention, detection, and treatment of acute complications.  Needs Review    Patient understands how to develop strategies to promote health/change behavior.  Needs Review      Complications   Last HgB A1C  per patient/outside source  11.5 %    How often do you check your blood sugar?  3-4 times / week    Fasting Blood glucose range (mg/dL)  130-179;180-200    Postprandial Blood glucose range (mg/dL)  >200    Number of hypoglycemic episodes per month  0    Number of hyperglycemic episodes per week  7      Dietary Intake   Lunch  peanut butter, fruit, mini bagel    Beverage(s)  water, half and half tea, diet soda      Exercise   Exercise Type  ADL's;Light (walking / raking leaves)    How many days per week to you exercise?  7    How many minutes per day do you exercise?  15    Total minutes per week of exercise  105      Patient Education   Previous Diabetes Education  Yes (please comment)   here   Medications  Reviewed patients medication for diabetes, action, purpose, timing of dose and side effects.   discussed actions of meds   Monitoring  Identified appropriate SMBG and/or A1C goals.;Other (comment)    Personal strategies to promote health  Helped patient develop diabetes management plan for (enter comment)   using CGM to learn about effects fo food/meds/physical act  Individualized Goals (developed by patient)   Monitoring   test my blood glucose as discussed   6 times a day     Outcomes   Expected Outcomes  Demonstrated interest in learning. Expect positive outcomes    Future DMSE  2 wks    Program Status  Not Completed       Individualized Plan for Diabetes Self-Management Training:   Learning Objective:  Patient will have a greater understanding of diabetes self-management. Patient education plan is to attend individual and/or group sessions per assessed needs and concerns.   Plan:   There are no Patient Instructions on file for this visit.  Expected Outcomes:  Demonstrated interest in learning. Expect positive outcomes  Education material provided: Diabetes Resources  If problems or questions, patient to contact team via:  Phone and Email  Future DSME  appointment: 2 wks by phone Debera Lat, RD 10/25/2019 2:13 PM.

## 2019-10-26 ENCOUNTER — Other Ambulatory Visit: Payer: Self-pay | Admitting: Dietician

## 2019-10-26 DIAGNOSIS — E1169 Type 2 diabetes mellitus with other specified complication: Secondary | ICD-10-CM

## 2019-10-26 MED ORDER — FREESTYLE LIBRE 2 SENSOR MISC: 1.0000 | Freq: Every day | 3 refills | Status: DC

## 2019-10-26 NOTE — Telephone Encounter (Signed)
Received meter download. Discussed with patient. She agreed to make no changes for another 24 hours of CGM wear to see how it reads after the 24 ours warm up period. . If any blood sugar < 100 mg/dl in the next 24 hours she agreed to decrease her Tresiba dose by 2 units and if another reading <70 then decrease [name] again by another 2 units.

## 2019-10-26 NOTE — Telephone Encounter (Signed)
Denise Stevens was provided with a sample personal Freestyle Libre 2 CGM yesterday. She [facility].

## 2019-10-26 NOTE — Telephone Encounter (Signed)
Discussed comparing the CGM to the meter as she is finding a difference. One should no be compared to the other,but use one consistently. She reports low alarm fatigue at her original setting of 100 and therefore decreased the [place] alarm to 70 yesterday. It went off last night during sleep and read 67, and she thinks this was not accurate because it was in the first 24 hours. Denise Stevens will upload her CGM later today.

## 2019-10-29 ENCOUNTER — Telehealth: Payer: Self-pay | Admitting: Dietician

## 2019-10-29 NOTE — Telephone Encounter (Signed)
Hi Denise Stevens,  Way to go self-managing your diabetes !!!  You are doing well!  As a reminder- keep on the lookout for low blood sugar signs and symptoms -hunger -sweaty -clammy -fast heartbeat -shaky -weak -headache -irritable -anxious  If you get these symptoms- and must treat the low blood sugar with 15 grams carb(1/2 glass juice or reg soda or 5 lifesavers), feel free to dial down the [name] 1-2 units again while you are finding the right combo of meds for you.  The increased Victoza will gradually kick in over the next 1-2 weeks.    Take care,  Denise Stevens   From: Blanch Media [email or web address]>  Sent: Monday, October 29, 2019 12:42 AM To: Denise Stevens, Denise Stevens [email or web address]> Subject: [email or web address] Importance: High  Hi Denise Stevens,  I know it's late/early but I wanted to send this tonight.  I decreased the insulin to 2 notches 23 units today.  I increase the Victoza 2 notches (1.4).  My meter blood sugars have been good.  No lows but according to the CGM I have had lows over the weekend.  I'm so excited that my blood sugar before eating an early supper was 135.  I think I'm doing well.   Thanks,  Denise Stevens

## 2019-10-29 NOTE — Telephone Encounter (Signed)
Continued conversation via email:   Thank you for explaining, I forget that it's not just about food.  I need to remember that.   There was no dip or hummus but that's a good idea.  Hummus   Khrysten     Yes- Carrots are a great snack! They are low carb, high fiber. Sometimes it is hard to predict why blood sugars do what they do. Was there dip or hummus with the carrots?  You could eat the carrots one night and your fasting is in the 90s, another night- it is 120s and another night 150s.   I realize this can be frustrating to someone with diabetes.  We are looking for trends (rather than induvial numbers) and the range of how much the blood sugar goes up and down. Many things can affect blood sugars besides food- I believe there are at least 42 things to include:  sleep, hormones, other meds, activity ,pain, healing, etc.  Blood sugar go up and down many times daily, even in people without diabetes.   The CGM and finger stick are consistently about 40 mg/dl difference. I am glad they are consistent!   Hope this helps!  Butch Penny    Are carrots a good snack? I had them about 2 hrs before bed last night and this morning my bs was 155 this morning via finger stick and 112 on CGM.     Santiago Glad

## 2019-10-31 ENCOUNTER — Telehealth: Payer: Self-pay | Admitting: *Deleted

## 2019-10-31 NOTE — Telephone Encounter (Signed)
Information was called to [employer] for PA for [employer].  Awaiting determination within 24-72 hours.  PA [phone].  Sander Nephew, RN 10/31/2019 9:29 AM

## 2019-11-07 ENCOUNTER — Other Ambulatory Visit: Payer: Self-pay

## 2019-11-07 ENCOUNTER — Other Ambulatory Visit: Payer: Self-pay | Admitting: Internal Medicine

## 2019-11-07 MED ORDER — CYCLOBENZAPRINE HCL 5 MG PO TABS: 5.0000 mg | ORAL_TABLET | Freq: Three times a day (TID) | ORAL | 0 refills | Status: DC | PRN

## 2019-11-07 MED ORDER — GABAPENTIN 300 MG PO CAPS: 300.0000 mg | ORAL_CAPSULE | Freq: Two times a day (BID) | ORAL | 1 refills | Status: DC

## 2019-11-07 MED FILL — CYCLOBENZAPRINE HCL 5 MG TA: 5 | 30 days supply | Qty: 90 | Fill #0

## 2019-11-07 MED FILL — GABAPENTIN 300 MG CAPSULE: 300 | 90 days supply | Qty: 180 | Fill #0

## 2019-11-07 NOTE — Telephone Encounter (Signed)
TC to [facility], pharmacist states "Nevermind, we got it figured out". SChaplin, RN,BSN

## 2019-11-07 NOTE — Telephone Encounter (Signed)
Jasmine with [facility] requesting to speak with a nurse about gabapentin. Please call back.

## 2019-11-08 ENCOUNTER — Telehealth: Payer: Self-pay | Admitting: Dietician

## 2019-11-08 ENCOUNTER — Encounter: Payer: Self-pay | Admitting: Dietician

## 2019-11-08 ENCOUNTER — Ambulatory Visit (INDEPENDENT_AMBULATORY_CARE_PROVIDER_SITE_OTHER): Payer: 59 | Admitting: Dietician

## 2019-11-08 DIAGNOSIS — Z713 Dietary counseling and surveillance: Secondary | ICD-10-CM | POA: Diagnosis not present

## 2019-11-08 NOTE — Patient Instructions (Addendum)
How to decide what to eat?   Your  Answer to the the following questions can help:  1- What do I need?  A- - carb (half whole grain)  B- protein   C- vegetable and fruit 2- What do I want? 3- How hungry am I ?  Please increase the Victoza by 2 more clicks daily- you should now be 4 clicks past the 1.2 mg daily  Butch Penny [phone]

## 2019-11-08 NOTE — Progress Notes (Signed)
Diabetes Self-Management Education  Telephone visit due to COVID-19.  I connected with  Denise Stevens on 11/08/19 by  telephone and verified that I am speaking with the correct person using two identifiers.   I discussed the limitations of evaluation and management by telemedicine. The patient expressed understanding and agreed to proceed.  Visit Type:  follow up  Appt. Start Time: 1020 Appt. End Time: 1050  11/08/2019  [name], identified by name and date of birth, is a 54 y.o. female with a diagnosis of Diabetes:  Marland Kitchen Type 2  ASSESSMENT Diabetes medicine- not taking glipizide; taking lantus- 23 units daily januvia- 100 mg daily and liraglutide- tolerating 1.2 plus 2 clicks  She reports that her Tooth implant is planned for July; this is motivating her to lower her blood sugars She states her Weight is 302# today- it is decreasing Activity- was not discussed per patient Food- her questions were answered about how to decide what to eat based on her answers to the following 3 questions:  What I need?- whole grain carb,protein, vegetable and fruit What I want? How hungry am I ?  She verbalized very good understanding and needs support for increased self efficacy.   Thinks she had a low blood sugar on Monday but did not check her blood sugar   Blood sugars- sensor ended yesterday, she agrees to use figersticks for now. Will reveiw data when available   Lab Results  Component Value Date   HGBA1C 11.5 (A) 08/23/2019   HGBA1C 8.9 (A) 03/06/2019   HGBA1C 10.5 (A) 05/22/2018   HGBA1C 6.9 11/29/2017   HGBA1C 7.7 07/21/2017     Wt Readings from Last 10 Encounters:  10/11/19 (!) 308 lb (139.7 kg)  09/28/19 (!) 310 lb 9.6 oz (140.9 kg)  01/26/19 (!) 323 lb (146.5 kg)  01/08/19 (!) 314 lb (142.4 kg)  05/29/18 (!) 324 lb 12.8 oz (147.3 kg)  05/22/18 (!) 322 lb 4.8 oz (146.2 kg)  01/12/18 (!) 323 lb 11.2 oz (146.8 kg)  01/10/18 (!) 318 lb (144.2 kg)  07/21/17 (!) 317 lb  3.2 oz (143.9 kg)  07/05/17 (!) 322 lb 14.4 oz (146.5 kg)     Individualized Plan for Diabetes Self-Management Training:   Learning Objective:  Patient will have a greater understanding of diabetes self-management. Patient education plan is to attend individual and/or group sessions per assessed needs and concerns.   Plan:   There are no Patient Instructions on file for this visit.  Expected Outcomes:     Education material provided: Diabetes Resources  If problems or questions, patient to contact team via:  Phone and Email  Future DSME appointment:   by phone in 4 weeks Debera Lat, RD 11/08/2019 11:32 AM.

## 2019-11-08 NOTE — Telephone Encounter (Signed)
Below is an email exchange between this patient and myself. She provided her Freestyle Libre sensor and blood sugar data from her meter after our phone visits.   The results of the Freestyle Elenor Legato will be shared with Dr,. Lynnae January and are as follows:   CGM Results from [facility] of her personal reader from march 18 through November 07, 2019  % time is active is 27 ( >70 recommended)  Average is  95  for 14 days Glucose management indicator 5.6 % Time in range (70-180 mg/dL): 85 % (Goal >70%) Time High (181-250 mg/dL) 0 % (Goal < 25%) Time Very High (>250 mg/dl) 0 % (Goal < 5%) Time Low (54-69 mg/dL): 12 % (Goal is <4%) Time Very Low (<54) 3%  (Goal <1%)  Coefficient of variation:25.9% (Goal is <36%)      Email from [employer]  At 1:57 Pm today:   I'm good with that.  I will lower it to 20 units.  I will increase the Victoza by 4 clicks past the 1.2.     From: Safina Huard, Jeanmarie Hubert.[email or web address]>  Sent: Thursday, November 08, 2019 1:54 PM To: Blanch Media [email or web address]> Subject: RE: Glucose level 3/18 -3/30   Ok. I still think we should try to prevent that by lowering your Lantus dose- what do you think?   From: [employer]anch Media [email or web address]>  Sent: Thursday, November 08, 2019 1:52 PM To: Senya Hinzman, Butch Penny @[email or web address]> Subject: RE: Glucose level 3/18 -3/30   I didn't take my BS on Monday when feeling low it would've been between 5:00 - 5:30 pm ish when I was feeling pretty bad.    From: Sidney Kann, Jeanmarie Hubert.[email or web address]>  Sent: Thursday, November 08, 2019 1:43 PM To: Blanch Media [email or web address]> Subject: RE: Glucose level 3/18 -3/30   Thank you! Looking good!  I suggest aiming for < 130-140 right now for most of your fasting sugars.   Another question- was it this past Monday, March 29th between 1-230 PM that you felt low? The sensor said you were low then. 65 and 68 per your scans and the line was red and under  70 at that time If so, I suggest you drop your Lantus back to 20 units by Monday (or sooner if you'd like) when you increase the Victoza 4 clicks past the 1.2 (to ~ 1.5mg  /day).     You are doing great with meal planning and that will help lower and keep your blood sugars in check as well.    Here is your download: [email or web address] I hope you can access it.  It shows fabulous blood sugars. I am baffled by the difference between the [place] and your meter, but I'd rather risk your blood sugars being a bit higher than having the [place] feelings of being too low.   Happy holiday and have a great weekend!  Butch Penny  From: Blanch Media [email or web address]>  Sent: Thursday, November 08, 2019 1:09 PM To: Jayliani Wanner, Butch Penny @[email or web address]> Subject: FW: Glucose level 3/18 -3/30     From: Earnestine Tuohey, Butch Penny @[email or web address]>  Sent: Thursday, November 08, 2019 1:05 PM To: Blanch Media [email or web address]> Subject: RE: Glucose level 3/18 -3/30   I agree. Your averages are heading in the right direction.  You did a fabulous job recording the data! It is very easy to read. Quick favor- can you highlight in yellow the ones you think are fasting?   Thank you!   From: Blanch Media [email or web address]>  Sent: Thursday, November 08, 2019 12:26 PM To: Aira Sallade, Butch Penny @[email or web address]> Subject: Glucose level  3/18 -3/30  Importance: High  Date TIME Glucose   18-Mar 6:32 143    11:29 148    12:48 154    3:38 165   19-Mar 10:53 116    12:39 209    2:42 184    5:59 181   20-Mar 4:27 139    4:04 144    5:09 97   21-Mar 4:48 135   22-Mar 9:05 152   23-Mar 1:36 133   24-Mar 11:40 209   3-30 2:20 148             Average 30 d 194 n=28   14 d 154 n=14   7 d 148 n=1          Looks like my average is pretty good.

## 2019-11-20 ENCOUNTER — Other Ambulatory Visit: Payer: Self-pay | Admitting: Internal Medicine

## 2019-11-20 DIAGNOSIS — E1169 Type 2 diabetes mellitus with other specified complication: Secondary | ICD-10-CM

## 2019-11-22 ENCOUNTER — Telehealth: Payer: Self-pay | Admitting: *Deleted

## 2019-11-22 MED FILL — VICTOZA 18 MG/3 ML INJECT P: 18 | 90 days supply | Qty: 18 | Fill #0

## 2019-11-22 MED FILL — PRAVASTATIN NA 40 MG TAB: 40 | 90 days supply | Qty: 90 | Fill #2

## 2019-11-22 MED FILL — JANUVIA 100 MG TABLET: 100 | 60 days supply | Qty: 60 | Fill #3

## 2019-11-22 MED FILL — PANTOPRAZOLE SOD DR 40 MG T: 40 | 90 days supply | Qty: 180 | Fill #1

## 2019-11-22 NOTE — Telephone Encounter (Signed)
Information was called to [employer] for PA for [employer].  Awaitinf determination via Fax.  Sander Nephew, RN 11/22/2019 11:52 AM.

## 2019-12-04 DIAGNOSIS — M79672 Pain in left foot: Secondary | ICD-10-CM | POA: Diagnosis not present

## 2019-12-04 DIAGNOSIS — R269 Unspecified abnormalities of gait and mobility: Secondary | ICD-10-CM | POA: Diagnosis not present

## 2019-12-04 DIAGNOSIS — M25551 Pain in right hip: Secondary | ICD-10-CM | POA: Diagnosis not present

## 2019-12-07 ENCOUNTER — Other Ambulatory Visit (INDEPENDENT_AMBULATORY_CARE_PROVIDER_SITE_OTHER): Payer: 59

## 2019-12-07 DIAGNOSIS — E1169 Type 2 diabetes mellitus with other specified complication: Secondary | ICD-10-CM | POA: Diagnosis not present

## 2019-12-07 LAB — POCT GLYCOSYLATED HEMOGLOBIN (HGB A1C): Hemoglobin A1C: 8.1 % — AB (ref 4.0–5.6)

## 2019-12-07 LAB — GLUCOSE, CAPILLARY: Glucose-Capillary: 250 mg/dL — ABNORMAL HIGH (ref 70–99)

## 2019-12-10 ENCOUNTER — Encounter: Payer: Self-pay | Admitting: Dietician

## 2019-12-10 ENCOUNTER — Other Ambulatory Visit: Payer: Self-pay

## 2019-12-10 ENCOUNTER — Ambulatory Visit (INDEPENDENT_AMBULATORY_CARE_PROVIDER_SITE_OTHER): Payer: 59 | Admitting: Dietician

## 2019-12-10 DIAGNOSIS — Z713 Dietary counseling and surveillance: Secondary | ICD-10-CM

## 2019-12-10 DIAGNOSIS — E119 Type 2 diabetes mellitus without complications: Secondary | ICD-10-CM

## 2019-12-10 MED FILL — UNIFINE PENTIPS 32GX5/32: 32G X 4 MM | 50 days supply | Qty: 100 | Fill #0

## 2019-12-10 NOTE — Progress Notes (Signed)
Diabetes Self-Management Education  Telephone visit due to COVID-19.  I connected with Alycia Patten on 12/10/19 by  telephone and verified that I am speaking with the correct person using two identifiers.  I discussed the limitations of evaluation and management by telemedicine. The patient expressed understanding and agreed to proceed.  Visit Type:  follow up  Appt. Start Time: 1040            Appt. End Time: [place] is excited that her A1C is decreased. She is waiting for a callback from her dentist to see if she can start on her implant. This is motivating her to lower her blood sugars.  She reports a 5# weight gain.  Lab Results  Component Value Date   HGBA1C 8.1 (A) 12/07/2019   HGBA1C 11.5 (A) 08/23/2019   HGBA1C 8.9 (A) 03/06/2019   HGBA1C 10.5 (A) 05/22/2018   HGBA1C 6.9 11/29/2017    Wt Readings from Last 5 Encounters:  10/11/19 (!) 308 lb (139.7 kg)  09/28/19 (!) 310 lb 9.6 oz (140.9 kg)  01/26/19 (!) 323 lb (146.5 kg)  01/08/19 (!) 314 lb (142.4 kg)  05/29/18 (!) 324 lb 12.8 oz (147.3 kg)    Diabetes medicine- decreased l;antus to 18 units about 3 weeks agp due to low blood sugar. None since thn. She is also taking [place]- 100 mg daily and liraglutide- tolerating 1.2 plus 4 clicks  Activity- [facility] at [facility]. Doing foot, hip and other exercises from them daily, thinking about more in the future.   Nutrition- she is eating more fruit-blue, straw,waterr,cantelope,grapes,  Drinking more water, and eating balanced meals daily from [place]. Has trouble sometimes with tolerating salad greens, but does eat carrots, cukes, green beans. Is thinking about trying cauliflower rice,    Blood sugars- stopped checking blood sugars- 2-3 weeks ago, does not have meter today. Her symptoms of  Low blood sugar- that happened ~ 3 weeks ago are  moody, headache, hungry and had just eaten. When she ate she felt better.   PLan is for her to  1- start  doing at least 1 weekly profile by checking blood sugar 1 day per week before and 2 hours after each meal and email these to me.  2- Keep meter with her while on insulin in case of low blood sugar 3- Increase Victoza to 1.8mg /day  4- Decrease insulin to 16 units  5- keep working on eating more vegetables for example by trying some cauliflower rice  Consider stating that as a goal:  I will do _______________ in the next ______________ days/ weeks.  "I will find a recipe with want to try using caulfilfower rice in the next 7 days. "  Plan for follow up: emailed weekly profiles and 6 weeks [place], [place] 12/10/2019 3:38 PM.

## 2019-12-10 NOTE — Patient Instructions (Signed)
Hi Yanelis,   You are doing a wonderful job of taking care of your diabetes!!!  Here is a recap of what we discussed:  1- Start doing at least 1 weekly profile by checking blood sugar 1 day per week before and 2 hours after each meal and email these to me.  2- Keep meter with her while on insulin in case of low blood sugar 3- Increase Victoza to 1.8mg /day  4- Decrease insulin to 16 units  5- Keep working on eating more vegetables for example by trying some cauliflower rice 6- Keep trying to work in more activity in your every day routine Some ideas that may or may not be right for you...Marland Kitchen.  -by getting up from desk every hour and walking to end of hall and back  - taking stairs one time a day - walking to mail room a few times a week  Consider stating that as a goal:  I will do _______________ in the next ______________ days/ weeks.  "I will find a recipe with want to try using cauliflower rice in the next 7 days. "  As always, email or call if needed.  Butch Penny [phone]

## 2019-12-11 ENCOUNTER — Other Ambulatory Visit: Payer: Self-pay | Admitting: Internal Medicine

## 2019-12-11 DIAGNOSIS — E1169 Type 2 diabetes mellitus with other specified complication: Secondary | ICD-10-CM

## 2019-12-11 DIAGNOSIS — E119 Type 2 diabetes mellitus without complications: Secondary | ICD-10-CM

## 2019-12-11 MED ORDER — LANTUS SOLOSTAR 100 UNIT/ML ~~LOC~~ SOPN: PEN_INJECTOR | SUBCUTANEOUS | 11 refills | Status: DC

## 2019-12-11 MED ORDER — VICTOZA 18 MG/3ML ~~LOC~~ SOPN: 1.8000 mg | PEN_INJECTOR | Freq: Every day | SUBCUTANEOUS | 3 refills | Status: DC

## 2019-12-13 ENCOUNTER — Other Ambulatory Visit: Payer: Self-pay | Admitting: Internal Medicine

## 2019-12-13 DIAGNOSIS — E041 Nontoxic single thyroid nodule: Secondary | ICD-10-CM

## 2019-12-13 MED ORDER — ALCLOMETASONE DIPROPIONATE 0.05 % EX CREA: TOPICAL_CREAM | Freq: Two times a day (BID) | CUTANEOUS | 0 refills | Status: AC

## 2019-12-13 MED FILL — ALCLOMETASONE DIPRO 0.05% C: 0.05 | 30 days supply | Qty: 30 | Fill #0

## 2019-12-19 ENCOUNTER — Ambulatory Visit: Payer: 59 | Admitting: Podiatry

## 2019-12-20 ENCOUNTER — Ambulatory Visit: Payer: 59 | Admitting: Internal Medicine

## 2019-12-20 ENCOUNTER — Encounter: Payer: Self-pay | Admitting: Internal Medicine

## 2019-12-20 VITALS — BP 126/77 | HR 104 | Temp 98.5°F | Ht 65.0 in | Wt 320.0 lb

## 2019-12-20 DIAGNOSIS — Z9889 Other specified postprocedural states: Secondary | ICD-10-CM

## 2019-12-20 DIAGNOSIS — J301 Allergic rhinitis due to pollen: Secondary | ICD-10-CM

## 2019-12-20 DIAGNOSIS — Z0000 Encounter for general adult medical examination without abnormal findings: Secondary | ICD-10-CM | POA: Diagnosis not present

## 2019-12-20 DIAGNOSIS — D649 Anemia, unspecified: Secondary | ICD-10-CM | POA: Diagnosis not present

## 2019-12-20 DIAGNOSIS — G8929 Other chronic pain: Secondary | ICD-10-CM | POA: Insufficient documentation

## 2019-12-20 DIAGNOSIS — E041 Nontoxic single thyroid nodule: Secondary | ICD-10-CM

## 2019-12-20 DIAGNOSIS — E785 Hyperlipidemia, unspecified: Secondary | ICD-10-CM | POA: Diagnosis not present

## 2019-12-20 DIAGNOSIS — I10 Essential (primary) hypertension: Secondary | ICD-10-CM

## 2019-12-20 DIAGNOSIS — K219 Gastro-esophageal reflux disease without esophagitis: Secondary | ICD-10-CM

## 2019-12-20 DIAGNOSIS — F3281 Premenstrual dysphoric disorder: Secondary | ICD-10-CM

## 2019-12-20 DIAGNOSIS — R4589 Other symptoms and signs involving emotional state: Secondary | ICD-10-CM

## 2019-12-20 DIAGNOSIS — G4733 Obstructive sleep apnea (adult) (pediatric): Secondary | ICD-10-CM | POA: Diagnosis not present

## 2019-12-20 DIAGNOSIS — E1169 Type 2 diabetes mellitus with other specified complication: Secondary | ICD-10-CM | POA: Diagnosis not present

## 2019-12-20 DIAGNOSIS — Z79899 Other long term (current) drug therapy: Secondary | ICD-10-CM

## 2019-12-20 MED ORDER — ALPRAZOLAM 0.25 MG PO TABS: 0.2500 mg | ORAL_TABLET | Freq: Every evening | ORAL | 0 refills | Status: AC | PRN

## 2019-12-20 MED ORDER — INSULIN DEGLUDEC 100 UNIT/ML ~~LOC~~ SOPN: 16.0000 [IU] | PEN_INJECTOR | Freq: Every day | SUBCUTANEOUS | 3 refills | Status: DC

## 2019-12-20 MED ORDER — DULOXETINE HCL 30 MG PO CPEP: 30.0000 mg | ORAL_CAPSULE | Freq: Every day | ORAL | 1 refills | Status: DC

## 2019-12-20 NOTE — Assessment & Plan Note (Signed)
This problem is chronic and uncontrolled.  We reviewed the results of her most recent home sleep study that showed an AHI of only 7 corresponding to very mild OSA.  She is not using her CPAP but is doing all of the other behavioral modifications.  However, she states that she does feel better more energetic when she uses her CPAP.  PLAN : follow

## 2019-12-20 NOTE — Assessment & Plan Note (Signed)
This problem is chronic and well controlled.  She is on losartan 100 and HCTZ 25 without any side effects.  Her blood pressure is at goal.  PLAN:  Cont current meds   BP Readings from Last 3 Encounters:  12/20/19 126/77  10/11/19 126/80  10/02/19 131/73

## 2019-12-20 NOTE — Progress Notes (Signed)
   Subjective:    Patient ID: Denise Stevens, female    DOB: Apr 05, 1966, 54 y.o.   MRN: [id number]  HPI  Denise Stevens is here for DM F/U. Please see the A&P for the status of the pt's chronic medical problems.  ROS : per ROS section and in problem oriented charting. All other systems are negative.  PMHx, Soc hx, and / or Fam hx : Works at [employer]. Mother with DM, CKD, breast Ca  Review of Systems  Constitutional: Positive for fatigue. Negative for appetite change and fever.  HENT: Negative for congestion and rhinorrhea.   Eyes: Positive for visual disturbance.       Dry eyes  Respiratory: Negative for shortness of breath.   Cardiovascular: Negative for chest pain and leg swelling.  Gastrointestinal: Positive for constipation. Negative for abdominal pain and diarrhea.  Genitourinary: Negative for dysuria and frequency.  Neurological: Negative for dizziness and light-headedness.  Psychiatric/Behavioral: Positive for sleep disturbance.       Objective:   Physical Exam Constitutional:      General: She is not in acute distress.    Appearance: Normal appearance. She is not ill-appearing.  HENT:     Head: Normocephalic and atraumatic.     Right Ear: External ear normal.     Left Ear: External ear normal.     Nose: Nose normal.  Eyes:     Extraocular Movements: Extraocular movements intact.     Conjunctiva/sclera: Conjunctivae normal.  Musculoskeletal:        General: No swelling, tenderness, deformity or signs of injury.     Comments: Trace edema bilaterally  Skin:    General: Skin is warm and dry.     Comments: Well-healed surgical incision mid lumbar back  Neurological:     General: No focal deficit present.     Mental Status: She is alert. Mental status is at baseline.  Psychiatric:        Mood and Affect: Mood normal.        Behavior: Behavior normal.        Thought Content: Thought content normal.        Judgment: Judgment normal.       Assessment & Plan:

## 2019-12-20 NOTE — Assessment & Plan Note (Signed)
This problem is chronic and stable.  She uses Flonase daily and Benadryl only when needed and has not required it for quite some time.  PLAN:  Cont current meds

## 2019-12-20 NOTE — Assessment & Plan Note (Signed)
This problem is chronic and uncontrolled.  Her A1c trend has been 8.9 - 11.5 - 8.1 today.  She is on Tresiba 16, Januvia 100, and Victoza working her way from 1.2-1.8.  She has had great success decreasing her A1c and continues to work with Butch Penny.  She has had a couple of hypoglycemia episodes that resulted in lowering the Tresiba dose and her goal is to get her insulin down to off.  PLAN:  Cont current meds

## 2019-12-20 NOTE — Assessment & Plan Note (Signed)
This problem is chronic and controlled.  Her surgical incision is well-healed.  She still has weakness of flexion and extension of the left great toe of the ankle flexion and extension is normal.  She remains on gabapentin 300 twice daily and Advil twice daily.  She no longer is seeing the neurosurgeon.  PLAN : follow

## 2019-12-20 NOTE — Assessment & Plan Note (Signed)
This problem is chronic and well controlled.  She remains on her Wellbutrin and Lexapro with very good response.  Her Wellbutrin also controls her hot flashes.  She also has Xanax but has not taken any since December and it was refilled several years ago so I requested that she be thrown away and I sent in a new prescription for a small quantity to be used as needed.  PLAN:  Cont current meds

## 2019-12-20 NOTE — Assessment & Plan Note (Signed)
This problem is chronic and controlled.  She is on pravastatin 40 mg a moderate intensity statin without any side effects to the medication.  PLAN:  Cont current meds

## 2019-12-20 NOTE — Assessment & Plan Note (Signed)
This problem is chronic and controlled.  I have ordered a thyroid ultrasound and will check a TSH today.

## 2019-12-20 NOTE — Assessment & Plan Note (Signed)
This problem is chronic and controlled.  She is on Protonix and takes 2 pills each day.  She has no side effects to this medication.  PLAN:  Cont current meds

## 2019-12-20 NOTE — Assessment & Plan Note (Signed)
This problem is new.  We discussed the pain in her hips, back, ankles, and knees.  It has gotten worse since her back surgery.  We reviewed imaging of her hips, knees, and back all of which are consistent with osteoarthritis.  She is already on Advil twice daily and has topical Voltaren gel that does help somewhat.  She is trying to lose weight which will help POA symptoms and prevent progression.  We discussed adding duloxetine and elected to go ahead and try it.  It does interact with her antidepressants, increasing the level, so we will start at a low dose and monitor response.  She also enjoys exercising in the pool and is considering [facility]  PLAN : Continue Advil and topical diclofenac Duloxetine 30 mg once a day

## 2019-12-20 NOTE — Assessment & Plan Note (Signed)
Today's visit was to review all medical issues and meet criteria for determining insurance discount.

## 2019-12-21 ENCOUNTER — Other Ambulatory Visit: Payer: Self-pay | Admitting: Internal Medicine

## 2019-12-21 DIAGNOSIS — G4733 Obstructive sleep apnea (adult) (pediatric): Secondary | ICD-10-CM | POA: Diagnosis not present

## 2019-12-21 LAB — BMP8+ANION GAP
Anion Gap: 15 mmol/L (ref 10.0–18.0)
BUN/Creatinine Ratio: 9 (ref 9–23)
BUN: 8 mg/dL (ref 6–24)
CO2: 26 mmol/L (ref 20–29)
Calcium: 9.4 mg/dL (ref 8.7–10.2)
Chloride: 95 mmol/L — ABNORMAL LOW (ref 96–106)
Creatinine, Ser: 0.88 mg/dL (ref 0.57–1.00)
GFR calc Af Amer: 86 mL/min/{1.73_m2} (ref >59)
GFR calc non Af Amer: 74 mL/min/{1.73_m2} (ref >59)
Glucose: 333 mg/dL — ABNORMAL HIGH (ref 65–99)
Potassium: 4.1 mmol/L (ref 3.5–5.2)
Sodium: 136 mmol/L (ref 134–144)

## 2019-12-21 LAB — CBC
Hematocrit: 36.6 % (ref 34.0–46.6)
Hemoglobin: 11.6 g/dL (ref 11.1–15.9)
MCH: 27.6 pg (ref 26.6–33.0)
MCHC: 31.7 g/dL (ref 31.5–35.7)
MCV: 87 fL (ref 79–97)
Platelets: 326 10*3/uL (ref 150–450)
RBC: 4.2 x10E6/uL (ref 3.77–5.28)
RDW: 13.2 % (ref 11.7–15.4)
WBC: 10.6 10*3/uL (ref 3.4–10.8)

## 2019-12-21 LAB — MICROALBUMIN / CREATININE URINE RATIO
Creatinine, Urine: 90.4 mg/dL
Microalb/Creat Ratio: <3 mg/g creat (ref 0–29)
Microalbumin, Urine: <3 ug/mL

## 2019-12-21 LAB — FERRITIN: Ferritin: 218 ng/mL — ABNORMAL HIGH (ref 15–150)

## 2019-12-21 LAB — TSH: TSH: 1.35 u[IU]/mL (ref 0.450–4.500)

## 2019-12-21 NOTE — Addendum Note (Signed)
Addended by: Marcelino Duster on: 12/21/2019 11:57 AM   Modules accepted: Orders

## 2019-12-22 DIAGNOSIS — R05 Cough: Secondary | ICD-10-CM | POA: Diagnosis not present

## 2019-12-24 LAB — VITAMIN D 25 HYDROXY (VIT D DEFICIENCY, FRACTURES): Vit D, 25-Hydroxy: 23.1 ng/mL — ABNORMAL LOW (ref 30.0–100.0)

## 2019-12-24 LAB — SPECIMEN STATUS REPORT

## 2019-12-31 ENCOUNTER — Ambulatory Visit (HOSPITAL_COMMUNITY): Admission: RE | Admit: 2019-12-31 | Payer: 59 | Source: Ambulatory Visit

## 2020-01-17 ENCOUNTER — Other Ambulatory Visit: Payer: Self-pay | Admitting: Internal Medicine

## 2020-01-17 DIAGNOSIS — K219 Gastro-esophageal reflux disease without esophagitis: Secondary | ICD-10-CM

## 2020-01-17 MED ORDER — GI COCKTAIL ~~LOC~~: 30.0000 mL | Freq: Two times a day (BID) | ORAL | 1 refills | Status: DC | PRN

## 2020-01-17 MED ORDER — SITAGLIPTIN PHOSPHATE 100 MG PO TABS: ORAL_TABLET | ORAL | 3 refills | Status: DC

## 2020-01-18 ENCOUNTER — Encounter (HOSPITAL_COMMUNITY): Payer: Self-pay

## 2020-01-18 ENCOUNTER — Ambulatory Visit (HOSPITAL_COMMUNITY): Payer: 59

## 2020-01-21 ENCOUNTER — Other Ambulatory Visit: Payer: Self-pay | Admitting: Internal Medicine

## 2020-01-21 MED ORDER — CYCLOBENZAPRINE HCL 5 MG PO TABS: 5.0000 mg | ORAL_TABLET | Freq: Three times a day (TID) | ORAL | 1 refills | Status: AC | PRN

## 2020-01-21 MED FILL — CYCLOBENZAPRINE HCL 5 MG TA: 5 | 20 days supply | Qty: 60 | Fill #0

## 2020-01-22 DIAGNOSIS — H25013 Cortical age-related cataract, bilateral: Secondary | ICD-10-CM | POA: Diagnosis not present

## 2020-01-22 DIAGNOSIS — H2513 Age-related nuclear cataract, bilateral: Secondary | ICD-10-CM | POA: Diagnosis not present

## 2020-01-22 DIAGNOSIS — E119 Type 2 diabetes mellitus without complications: Secondary | ICD-10-CM | POA: Diagnosis not present

## 2020-01-22 DIAGNOSIS — H524 Presbyopia: Secondary | ICD-10-CM | POA: Diagnosis not present

## 2020-01-22 LAB — HM DIABETES EYE EXAM

## 2020-01-23 ENCOUNTER — Encounter: Payer: Self-pay | Admitting: *Deleted

## 2020-01-29 ENCOUNTER — Encounter: Payer: 59 | Admitting: Obstetrics & Gynecology

## 2020-02-07 ENCOUNTER — Encounter: Payer: Self-pay | Admitting: Obstetrics & Gynecology

## 2020-02-07 ENCOUNTER — Ambulatory Visit (INDEPENDENT_AMBULATORY_CARE_PROVIDER_SITE_OTHER): Payer: 59 | Admitting: Obstetrics & Gynecology

## 2020-02-07 ENCOUNTER — Other Ambulatory Visit: Payer: Self-pay

## 2020-02-07 VITALS — BP 126/80 | Ht 65.0 in | Wt 309.0 lb

## 2020-02-07 DIAGNOSIS — Z1272 Encounter for screening for malignant neoplasm of vagina: Secondary | ICD-10-CM

## 2020-02-07 DIAGNOSIS — Z6841 Body Mass Index (BMI) 40.0 and over, adult: Secondary | ICD-10-CM

## 2020-02-07 DIAGNOSIS — Z780 Asymptomatic menopausal state: Secondary | ICD-10-CM | POA: Diagnosis not present

## 2020-02-07 DIAGNOSIS — Z90710 Acquired absence of both cervix and uterus: Secondary | ICD-10-CM

## 2020-02-07 DIAGNOSIS — Z01419 Encounter for gynecological examination (general) (routine) without abnormal findings: Secondary | ICD-10-CM | POA: Diagnosis not present

## 2020-02-07 NOTE — Progress Notes (Signed)
Denise Stevens 07-Aug-1966 [id number]   History:    54 y.o. G2P2L2  Single/Boyfriend  RP:  Established patient presenting for annual gyn exam   HPI: S/P Total Hysterectomy.  Menopause, well on no HRT.  Less hot flushes after increasing Lexapro last year.  No pelvic pain.  No pain with IC.  Urine/BMs normal.  Breasts normal.  BMI 51.42.  Planning to start swimming/walking in the pool.  DM on Insulin.  Health labs with Fam MD.   Past medical history,surgical history, family history and social history were all reviewed and documented in the EPIC chart.  Gynecologic History No LMP recorded. Patient has had a hysterectomy.  Obstetric History OB History  Gravida Para Term Preterm AB Living  2 2       2   SAB TAB Ectopic Multiple Live Births               # Outcome Date GA Lbr Len/2nd Weight Sex Delivery Anes PTL Lv  2 Para           1 Para              ROS: A ROS was performed and pertinent positives and negatives are included in the history.  GENERAL: No fevers or chills. HEENT: No change in vision, no earache, sore throat or sinus congestion. NECK: No pain or stiffness. CARDIOVASCULAR: No chest pain or pressure. No palpitations. PULMONARY: No shortness of breath, cough or wheeze. GASTROINTESTINAL: No abdominal pain, nausea, vomiting or diarrhea, melena or bright red blood per rectum. GENITOURINARY: No urinary frequency, urgency, hesitancy or dysuria. MUSCULOSKELETAL: No joint or muscle pain, no back pain, no recent trauma. DERMATOLOGIC: No rash, no itching, no lesions. ENDOCRINE: No polyuria, polydipsia, no heat or cold intolerance. No recent change in weight. HEMATOLOGICAL: No anemia or easy bruising or bleeding. NEUROLOGIC: No headache, seizures, numbness, tingling or weakness. PSYCHIATRIC: No depression, no loss of interest in normal activity or change in sleep pattern.     Exam:   BP 126/80 (BP Location: Right Arm, Patient Position: Sitting, Cuff Size: Large)   Ht 5\' 5"   (1.651 m)   Wt (!) 309 lb (140.2 kg)   BMI 51.42 kg/m   Body mass index is 51.42 kg/m.  General appearance : Well developed well nourished female. No acute distress HEENT: Eyes: no retinal hemorrhage or exudates,  Neck supple, trachea midline, no carotid bruits, no thyroidmegaly Lungs: Clear to auscultation, no rhonchi or wheezes, or rib retractions  Heart: Regular rate and rhythm, no murmurs or gallops Breast:Examined in sitting and supine position were symmetrical in appearance, no palpable masses or tenderness,  no skin retraction, no nipple inversion, no nipple discharge, no skin discoloration, no axillary or supraclavicular lymphadenopathy Abdomen: no palpable masses or tenderness, no rebound or guarding Extremities: no edema or skin discoloration or tenderness  Pelvic: Vulva: Normal             Vagina: No gross lesions or discharge.  Pap reflex/Gono-Chlam   Cervix/Uterus Absent  Adnexa  Without masses or tenderness  Anus: Normalm   Assessment/Plan:  54 y.o. female for annual exam   1. Encounter for Papanicolaou smear of vagina as part of routine gynecological examination Gynecologic exam status post total hysterectomy.  Pap reflex done at the vaginal vault.  Gonorrhea and Chlamydia done as well.  Breast exam normal.  Screening mammogram November 2020 was negative.  Colonoscopy in 2016.  Health labs with family physician.  2. S/P total hysterectomy  3. Postmenopause Well on no hormone replacement therapy.  Recommend vitamin D with calcium 1200 mg daily and weightbearing physical activities on a regular basis.  4. Class 3 severe obesity due to excess calories with serious comorbidity and body mass index (BMI) of 50.0 to 59.9 in adult [facility]) Recommend a lower calorie/carb diet.  Following a low sugar diabetic diet.  Aerobic activities 5 times a week and light weightlifting every 2 days.  Planning to start aerobic activities in the pool.  Princess Bruins MD, 9:57 AM  02/07/2020

## 2020-02-09 ENCOUNTER — Encounter: Payer: Self-pay | Admitting: Obstetrics & Gynecology

## 2020-02-09 NOTE — Patient Instructions (Signed)
1. Encounter for Papanicolaou smear of vagina as part of routine gynecological examination Gynecologic exam status post total hysterectomy.  Pap reflex done at the vaginal vault.  Gonorrhea and Chlamydia done as well.  Breast exam normal.  Screening mammogram November 2020 was negative.  Colonoscopy in 2016.  Health labs with family physician.  2. S/P total hysterectomy  3. Postmenopause Well on no hormone replacement therapy.  Recommend vitamin D with calcium 1200 mg daily and weightbearing physical activities on a regular basis.  4. Class 3 severe obesity due to excess calories with serious comorbidity and body mass index (BMI) of 50.0 to 59.9 in adult [facility]) Recommend a lower calorie/carb diet.  Following a low sugar diabetic diet.  Aerobic activities 5 times a week and light weightlifting every 2 days.  Planning to start aerobic activities in the pool.  Rodneisha, it was a pleasure seeing you today!  I will inform you of your results as soon as they are available.

## 2020-02-13 LAB — PAP THINPREP ASCUS RFLX HPV RFLX TYPE
C. trachomatis RNA, TMA: NOT DETECTED
N. gonorrhoeae RNA, TMA: NOT DETECTED

## 2020-03-04 ENCOUNTER — Telehealth: Payer: Self-pay | Admitting: Dietician

## 2020-03-04 NOTE — Telephone Encounter (Signed)
Denise Stevens called asking for help increasing her Tresiba insulin. She went off all her diabetes medicines for a few weeks and her blood sugars became uncontrolled with fasting today 200, a few days ago 300 and later in the day 148-425. She restarted her Tresiba at 25 units Victoza at 1.2mg  daily and 100 mg Januvia about 1 week ago. She would like her target fasting to be 130 mg/dl and is motivated to lower them so she can get her dental work done without delay.  Lab Results  Component Value Date   HGBA1C 8.1 (A) 12/07/2019   HGBA1C 11.5 (A) 08/23/2019   HGBA1C 8.9 (A) 03/06/2019   HGBA1C 10.5 (A) 05/22/2018   HGBA1C 6.9 11/29/2017    She does not tolerate an increased dose of victoza, therefore would like to increase the [name] insulin. She was advised she could increase the [name] 2 units every 3 days until 2/3 fasting readings are <130 mg/dl(This is her stated target blood sugar). This plan will be discussed with her doctor and she will be contacted if needed. She will follow up with [facility] on Friday for an update. Her a1c can be done that day if warranted.

## 2020-03-06 NOTE — Telephone Encounter (Signed)
Discussed with patient yesterday and reviewed her CBGs. She remains uncontrolled. I agree with this plan, she will increase tresiba by 2 units. She has follow up scheduled with me in clinic next month.

## 2020-03-07 ENCOUNTER — Other Ambulatory Visit: Payer: Self-pay | Admitting: Internal Medicine

## 2020-03-07 MED FILL — TRESIBA FLEXTOUCH 100 UNITS: 100 | 60 days supply | Qty: 15 | Fill #1

## 2020-03-07 MED FILL — PANTOPRAZOLE SOD DR 40 MG T: 40 | 90 days supply | Qty: 180 | Fill #2

## 2020-03-07 MED FILL — DULoxetine HCL 30 MG CPEP: 30 | 90 days supply | Qty: 90 | Fill #1

## 2020-03-07 MED FILL — LOSARTAN-HCTZ 100-25 MG TAB: 100-25 | 90 days supply | Qty: 90 | Fill #0

## 2020-03-10 MED FILL — TRIAZOLAM 0.25 MG TABLET: 0.25 | 1 days supply | Qty: 1 | Fill #0

## 2020-03-11 NOTE — Telephone Encounter (Signed)
Email exchange from 03/11/20:  Denise Stevens,  Your blood sugars are much improved! I think the 25 units is fine. I would stay at 25 units per day. Id like to see your blood sugars be a bit lower.  It is okay for someone on insulin to be as low as 90 because that is still 20 points above being too low. I have some Dexcom CGM samples that need a smartphone to operate and therefore need to be used if you are interested you may have one.   Thanks for the update! Denise Stevens  From: Denise Stevens [email or web address]>  Sent: Tuesday, March 11, 2020 2:48 PM To: Denise Stevens, Denise Stevens [email or web address]> Subject: Blood Glucose Levels   Update:  8/3:     9:30                191 8/3      12:16              186  7/31    11:21a            141             9:30a              166             1:02p              131  7/30    9:13p              181 7/29    3:17pm           158 7/29    1:22pm           115  I have gone down to 23 units of insulin or should I stay at 25 units  The 9:30a are my fasting

## 2020-03-11 NOTE — Telephone Encounter (Signed)
Late entry for 02/24/20 from an email exchange with patient:  Good Morning Denise Stevens, Here are some numbers from this past week.    Happy to report this morning my bs was 178 fasting.  I did take 27units of [name].  Denise Stevens!  I was so worried yesterday.    From: Denise Stevens  Sent: Wednesday, March 05, 2020 3:14 PM To: Denise Stevens [email or web address]> Subject: Blood Glucose Levels   Date BS with Times BS with Times BS with Times  7-20 425/3:47p    7-21 240/3:35a 197/9:25a 198/1:13P   178/2:37p 146/7:41p   7-22 192/8:50a    7-26 306/7:44a 308/7:48a   7-27 203/12:07p    7-28 225/8:54a 243/2:50P   7-29 178/9am

## 2020-03-12 NOTE — Telephone Encounter (Signed)
I agree, thank you Butch Penny!

## 2020-03-13 ENCOUNTER — Encounter (HOSPITAL_COMMUNITY): Payer: Self-pay

## 2020-03-13 ENCOUNTER — Ambulatory Visit (HOSPITAL_COMMUNITY): Payer: 59

## 2020-03-26 ENCOUNTER — Other Ambulatory Visit: Payer: Self-pay

## 2020-03-26 ENCOUNTER — Encounter: Payer: Self-pay | Admitting: Internal Medicine

## 2020-03-26 ENCOUNTER — Ambulatory Visit (INDEPENDENT_AMBULATORY_CARE_PROVIDER_SITE_OTHER): Payer: 59 | Admitting: Internal Medicine

## 2020-03-26 VITALS — BP 124/73 | HR 98 | Temp 98.3°F | Ht 65.0 in | Wt 308.2 lb

## 2020-03-26 DIAGNOSIS — E1169 Type 2 diabetes mellitus with other specified complication: Secondary | ICD-10-CM

## 2020-03-26 DIAGNOSIS — E041 Nontoxic single thyroid nodule: Secondary | ICD-10-CM

## 2020-03-26 DIAGNOSIS — I10 Essential (primary) hypertension: Secondary | ICD-10-CM

## 2020-03-26 DIAGNOSIS — Z9889 Other specified postprocedural states: Secondary | ICD-10-CM

## 2020-03-26 LAB — GLUCOSE, CAPILLARY: Glucose-Capillary: 168 mg/dL — ABNORMAL HIGH (ref 70–99)

## 2020-03-26 LAB — POCT GLYCOSYLATED HEMOGLOBIN (HGB A1C): Hemoglobin A1C: 9.9 % — AB (ref 4.0–5.6)

## 2020-03-26 MED FILL — AMOXICILLIN 500 MG CAPSULE: 500 | 7 days supply | Qty: 21 | Fill #0

## 2020-03-26 NOTE — Progress Notes (Signed)
Subjective:   Patient ID: Denise Stevens female   DOB: 01-Dec-1965 54 y.o.   MRN: [id number]  HPI: [name] is a 54 y.o. female with past medical history outlined below here for diabetes follow up. For the details of today's visit, please refer to the assessment and plan.   Past Medical History:  Diagnosis Date  . Asthma    no intubation or hospitalization hx  . Depression   . Diabetes mellitus   . GERD (gastroesophageal reflux disease)   . HTN (hypertension)   . Hyperlipidemia    Current Outpatient Medications  Medication Sig Dispense Refill  . acetic acid-hydrocortisone (VOSOL-HC) OTIC solution Place 4 drops into both ears 3 (three) times daily. (Patient taking differently: Place 1-2 drops into both ears 3 (three) times daily as needed (dermatitis- to be applied topically). ) 10 mL 1  . alclomethasone (ACLOVATE) 0.05 % cream Apply topically 2 (two) times daily. 30 g 0  . ALPRAZolam (XANAX) 0.25 MG tablet Take 1 tablet (0.25 mg total) by mouth at bedtime as needed for anxiety. 30 tablet 0  . Blood Glucose Monitoring Suppl (ACCU-CHEK GUIDE) w/Device KIT 1 each by Does not apply route 3 (three) times daily as needed. 1 kit 1  . buPROPion (WELLBUTRIN XL) 150 MG 24 hr tablet Take 1 tablet (150 mg total) by mouth daily. 90 tablet 3  . cyclobenzaprine (FLEXERIL) 5 MG tablet Take 1 tablet (5 mg total) by mouth 3 (three) times daily as needed for muscle spasms. 60 tablet 1  . DULoxetine (CYMBALTA) 30 MG capsule Take 1 capsule (30 mg total) by mouth daily. 90 capsule 1  . escitalopram (LEXAPRO) 10 MG tablet TAKE 2 TABLETS BY MOUTH DAILY. (Patient taking differently: Take 20 mg by mouth daily. ) 180 tablet 3  . fluticasone (FLONASE) 50 MCG/ACT nasal spray Place 1 spray into both nostrils 2 (two) times daily. (Patient taking differently: Place 1 spray into both nostrils 2 (two) times daily as needed for allergies or rhinitis. ) 48 g 3  . gabapentin (NEURONTIN) 300 MG capsule Take 1  capsule (300 mg total) by mouth 2 (two) times daily. 180 capsule 1  . glucose blood (FREESTYLE TEST STRIPS) test strip Use as instructed 100 each 12  . glucose monitoring kit (FREESTYLE) monitoring kit 1 each by Does not apply route as needed for other. 1 each 0  . insulin degludec (TRESIBA) 100 UNIT/ML FlexTouch Pen Inject 0.16 mLs (16 Units total) into the skin daily. 15 mL 3  . Insulin Pen Needle 32G X 4 MM MISC Use to inject insulin and victoza daily 200 each 3  . Lancets (FREESTYLE) lancets Use as instructed 100 each 12  . liraglutide (VICTOZA) 18 MG/3ML SOPN Inject 0.3 mLs (1.8 mg total) into the skin daily. 18 mL 3  . losartan-hydrochlorothiazide (HYZAAR) 100-25 MG tablet TAKE 1 TABLET BY MOUTH DAILY. 90 tablet 3  . pantoprazole (PROTONIX) 40 MG tablet Take 1 tablet (40 mg total) by mouth 2 (two) times daily before a meal. 180 tablet 3  . pravastatin (PRAVACHOL) 40 MG tablet Take 1 tablet (40 mg total) by mouth daily. 90 tablet 3  . sitaGLIPtin (JANUVIA) 100 MG tablet TAKE 1 TABLET (100 MG TOTAL) BY MOUTH DAILY. 90 tablet 3   No current facility-administered medications for this visit.   Family History  Problem Relation Age of Onset  . Cancer Father        prostate  . Hyperlipidemia Mother   . Hypertension  Mother   . Kidney disease Mother   . Diabetes Mother   . Breast cancer Mother   . Obesity Sister        s/p bypass  . Heart attack Neg Hx    Social History   Socioeconomic History  . Marital status: Divorced    Spouse name: Not on file  . Number of children: 2  . Years of education:  Some college  . Highest education level: Not on file  Occupational History  . Occupation: [employer]  Tobacco Use  . Smoking status: Never Smoker  . Smokeless tobacco: Never Used  Vaping Use  . Vaping Use: Never used  Substance and Sexual Activity  . Alcohol use: Not Currently    Alcohol/week: 0.0 standard drinks  . Drug use: No  . Sexual activity: Yes    Partners: Male     Birth control/protection: None    Comment: 1st intercourse- 66, partners- less than , divorded  Other Topics Concern  . Not on file  Social History Narrative   Lives with 19 y/o son and boyfriend (05/11/16)   Caffeine use: daily - 20oz diet mtn dew   Social Determinants of Health   Financial Resource Strain:   . Difficulty of Paying Living Expenses:   Food Insecurity:   . Worried About Charity fundraiser in the Last Year:   . Arboriculturist in the Last Year:   Transportation Needs:   . Film/video editor (Medical):   Marland Kitchen Lack of Transportation (Non-Medical):   Physical Activity:   . Days of Exercise per Week:   . Minutes of Exercise per Session:   Stress:   . Feeling of Stress :   Social Connections:   . Frequency of Communication with Friends and Family:   . Frequency of Social Gatherings with Friends and Family:   . Attends Religious Services:   . Active Member of Clubs or Organizations:   . Attends Archivist Meetings:   Marland Kitchen Marital Status:     Review of Systems: Review of Systems  Respiratory: Negative for shortness of breath.   Cardiovascular: Negative for chest pain.     Objective:  Physical Exam:  Vitals:   03/26/20 0841  BP: 124/73  Pulse: 98  Temp: 98.3 F (36.8 C)  TempSrc: Oral  SpO2: 98%  Weight: (!) 308 lb 3.2 oz (139.8 kg)  Height: 5' 5"  (1.651 m)    Physical Exam Constitutional:      General: She is not in acute distress.    Appearance: Normal appearance.  Cardiovascular:     Rate and Rhythm: Normal rate and regular rhythm.     Heart sounds: Normal heart sounds.  Pulmonary:     Effort: Pulmonary effort is normal. No respiratory distress.     Breath sounds: Normal breath sounds.  Musculoskeletal:     Right lower leg: Edema present.     Left lower leg: Edema present.     Comments: 1+ pitting edema bilateral LE   Skin:    General: Skin is warm and dry.  Neurological:     Mental Status: She is alert and oriented to person,  place, and time.      Assessment & Plan:   See Encounters Tab for problem based charting.

## 2020-03-26 NOTE — Assessment & Plan Note (Signed)
Patient is here for diabetes follow-up.  Patient recently stopped her insulin and noted an uptrend in her CBGs.  She called the office and has been meeting with Butch Penny to retitrate her [name].  She is currently taking 25 units of Tresiba daily, Januvia 100 mg, and Victoza 1.8 mg.  Hemoglobin A1c today is uncontrolled, 9.9 from 8.13 months ago.  However she is only been on her current dose of insulin now for 2 weeks.  On review of her glucometer, her more recent CBGs have been within target range.  Plan to continue current regimen for now, encourage daily CBG checks, and follow-up in 3 months for repeat hemoglobin A1c.  Patient is to call if her CBGs become elevated.

## 2020-03-26 NOTE — Assessment & Plan Note (Signed)
Patient has a past medical history of HTN, currently well controlled on losartan 100 mg and HCTZ 25 mg daily. BP today is 124/73. She is tolerating her medications well without issue. Last BMP checked three months ago showed normal renal function and electrolytes.  -- Continue current losartan 100 mg and HCTZ 25 mg daily

## 2020-03-26 NOTE — Assessment & Plan Note (Signed)
Patient has history of thyroid nodule being followed with serial ultrasound.  Her last thyroid ultrasound done on 06-13-2018 showed a stable 1.5 cm nodule in the left inferior gland.  Recommended follow-up 1-2 years.  TSH checked 3 months ago was within normal range.  She is due for repeat ultrasound.  This was previously ordered, patient said she will call to reschedule.

## 2020-03-27 ENCOUNTER — Telehealth: Payer: Self-pay | Admitting: Internal Medicine

## 2020-03-27 ENCOUNTER — Encounter: Payer: Self-pay | Admitting: Internal Medicine

## 2020-03-27 MED ORDER — INSULIN DEGLUDEC 100 UNIT/ML ~~LOC~~ SOPN: 25.0000 [IU] | PEN_INJECTOR | Freq: Every day | SUBCUTANEOUS | 3 refills | Status: DC

## 2020-03-27 MED ORDER — HYDROCODONE-ACETAMINOPHEN 5-325 MG PO TABS: 1.0000 | ORAL_TABLET | Freq: Four times a day (QID) | ORAL | 0 refills | Status: DC | PRN

## 2020-03-27 MED FILL — HYDROCODON-APAP 5-325: 5-325 | 3 days supply | Qty: 10 | Fill #0

## 2020-03-27 NOTE — Telephone Encounter (Signed)
I got called by the patient this afternoon. Patient's PCP is my partner - Dr. Philipp Ovens who is not available today. She complained of facial pain and swelling following a dental procedure yesterday. She tried Alleve at home but has persistent pain. Will prescribe a short course of narco for pain control. Patient instructed to seek medical care if pain worsens

## 2020-03-27 NOTE — Assessment & Plan Note (Signed)
Patient has a history of left L4-5 disc herniation w/ L5 radiculopathy s/p microdiscectomy per neurosurgery on 10/01/19. She has persistent chronic bilateral back and flank pain although this has improved since the operation. She also has weakness of flexion and extension of the left great toe with some pain. She is currently taking gabapentin 300 mg qAM. The weakness sometimes causes her difficulty with ambulation and balance and occasionally feels like she needs a cane to steady herself. She paid private for private physical therapy after her procedure but only for a few sessions. She is willing to try PT again.  -- Referral placed; she is requesting to return to her prior PT center ([facility] on [address]) -- Continue gabapentin

## 2020-04-08 MED FILL — UNIFINE PENTIPS 32GX5/32: 32G X 4 MM | 50 days supply | Qty: 100 | Fill #1

## 2020-04-15 MED FILL — NAPROXEN SODIUM 550 MG TABS: 550 | 6 days supply | Qty: 12 | Fill #0

## 2020-04-15 MED FILL — AMOXICILLIN 500 MG CAPSULE: 500 | 7 days supply | Qty: 22 | Fill #0

## 2020-04-16 ENCOUNTER — Other Ambulatory Visit: Payer: Self-pay | Admitting: *Deleted

## 2020-04-16 MED ORDER — PRAVASTATIN SODIUM 40 MG PO TABS
40.0000 mg | ORAL_TABLET | Freq: Every day | ORAL | 3 refills | Status: DC
Start: 2020-04-16 — End: 2020-08-21

## 2020-04-16 MED FILL — PRAVASTATIN NA 40 MG TAB: 40 | 90 days supply | Qty: 90 | Fill #0

## 2020-04-21 MED FILL — AMOX-CLAV 500-125 MG TABLET: 500-125 | 7 days supply | Qty: 21 | Fill #0

## 2020-04-21 NOTE — Addendum Note (Signed)
Addended by: Hulan Fray on: 04/21/2020 05:16 PM   Modules accepted: Orders

## 2020-04-29 ENCOUNTER — Ambulatory Visit: Payer: 59

## 2020-05-05 MED FILL — ESCITALOPRAM 10 MG TABLET: 10 | 90 days supply | Qty: 180 | Fill #2

## 2020-05-05 MED FILL — JANUVIA 100 MG TABLET: 100 | 90 days supply | Qty: 90 | Fill #1

## 2020-05-05 MED FILL — CHLORHEXIDINE 0.12% RINSE: 0.12 | 18 days supply | Qty: 473 | Fill #0

## 2020-05-05 MED FILL — NAPROXEN SODIUM 550 MG TABS: 550 | 6 days supply | Qty: 12 | Fill #0

## 2020-05-05 MED FILL — GABAPENTIN 300 MG CAPSULE: 300 | 90 days supply | Qty: 180 | Fill #1

## 2020-05-08 ENCOUNTER — Other Ambulatory Visit: Payer: Self-pay

## 2020-05-08 ENCOUNTER — Other Ambulatory Visit: Payer: Self-pay | Admitting: Internal Medicine

## 2020-05-08 MED ORDER — BUPROPION HCL ER (XL) 150 MG PO TB24
150.0000 mg | ORAL_TABLET | Freq: Every day | ORAL | 3 refills | Status: DC
Start: 2020-05-08 — End: 2020-05-08

## 2020-05-08 MED FILL — buPROPion HCL ER (XL) 150 M: 150 | 90 days supply | Qty: 90 | Fill #0

## 2020-05-16 MED FILL — buPROPion HCL ER (XL) 150 M: 150 | 90 days supply | Qty: 90 | Fill #0

## 2020-05-23 ENCOUNTER — Ambulatory Visit (HOSPITAL_COMMUNITY)
Admission: RE | Admit: 2020-05-23 | Discharge: 2020-05-23 | Disposition: A | Discharge status: Home / Self Care | Payer: 59 | Source: Ambulatory Visit | Attending: Internal Medicine | Admitting: Internal Medicine

## 2020-05-23 ENCOUNTER — Other Ambulatory Visit: Payer: Self-pay

## 2020-05-23 DIAGNOSIS — E042 Nontoxic multinodular goiter: Secondary | ICD-10-CM | POA: Diagnosis not present

## 2020-05-23 DIAGNOSIS — E041 Nontoxic single thyroid nodule: Secondary | ICD-10-CM | POA: Insufficient documentation

## 2020-06-03 DIAGNOSIS — G510 Bell's palsy: Secondary | ICD-10-CM | POA: Diagnosis not present

## 2020-06-03 DIAGNOSIS — M9902 Segmental and somatic dysfunction of thoracic region: Secondary | ICD-10-CM | POA: Diagnosis not present

## 2020-06-03 DIAGNOSIS — M9903 Segmental and somatic dysfunction of lumbar region: Secondary | ICD-10-CM | POA: Diagnosis not present

## 2020-06-03 DIAGNOSIS — M9901 Segmental and somatic dysfunction of cervical region: Secondary | ICD-10-CM | POA: Diagnosis not present

## 2020-06-09 MED FILL — TRESIBA FLEXTOUCH 100 UNITS: 100 | 60 days supply | Qty: 15 | Fill #2

## 2020-06-09 MED FILL — UNIFINE PENTIPS 32GX5/32: 32G X 4 MM | 50 days supply | Qty: 100 | Fill #2

## 2020-06-09 MED FILL — VICTOZA 2-PAK 18 MG/3 ML PE: 18 | 90 days supply | Qty: 18 | Fill #0

## 2020-06-17 DIAGNOSIS — Z1231 Encounter for screening mammogram for malignant neoplasm of breast: Secondary | ICD-10-CM

## 2020-06-19 DIAGNOSIS — R269 Unspecified abnormalities of gait and mobility: Secondary | ICD-10-CM | POA: Diagnosis not present

## 2020-06-19 DIAGNOSIS — M25551 Pain in right hip: Secondary | ICD-10-CM | POA: Diagnosis not present

## 2020-06-19 DIAGNOSIS — M79672 Pain in left foot: Secondary | ICD-10-CM | POA: Diagnosis not present

## 2020-06-24 DIAGNOSIS — M549 Dorsalgia, unspecified: Secondary | ICD-10-CM | POA: Diagnosis not present

## 2020-06-24 DIAGNOSIS — M791 Myalgia, unspecified site: Secondary | ICD-10-CM | POA: Diagnosis not present

## 2020-06-24 DIAGNOSIS — R293 Abnormal posture: Secondary | ICD-10-CM | POA: Diagnosis not present

## 2020-06-24 DIAGNOSIS — M542 Cervicalgia: Secondary | ICD-10-CM | POA: Diagnosis not present

## 2020-06-27 DIAGNOSIS — M791 Myalgia, unspecified site: Secondary | ICD-10-CM | POA: Diagnosis not present

## 2020-06-27 DIAGNOSIS — M549 Dorsalgia, unspecified: Secondary | ICD-10-CM | POA: Diagnosis not present

## 2020-06-27 DIAGNOSIS — R293 Abnormal posture: Secondary | ICD-10-CM | POA: Diagnosis not present

## 2020-06-27 DIAGNOSIS — M542 Cervicalgia: Secondary | ICD-10-CM | POA: Diagnosis not present

## 2020-06-30 DIAGNOSIS — R293 Abnormal posture: Secondary | ICD-10-CM | POA: Diagnosis not present

## 2020-06-30 DIAGNOSIS — M791 Myalgia, unspecified site: Secondary | ICD-10-CM | POA: Diagnosis not present

## 2020-06-30 DIAGNOSIS — M542 Cervicalgia: Secondary | ICD-10-CM | POA: Diagnosis not present

## 2020-06-30 DIAGNOSIS — M549 Dorsalgia, unspecified: Secondary | ICD-10-CM | POA: Diagnosis not present

## 2020-07-01 DIAGNOSIS — M791 Myalgia, unspecified site: Secondary | ICD-10-CM | POA: Diagnosis not present

## 2020-07-01 DIAGNOSIS — M542 Cervicalgia: Secondary | ICD-10-CM | POA: Diagnosis not present

## 2020-07-01 DIAGNOSIS — R293 Abnormal posture: Secondary | ICD-10-CM | POA: Diagnosis not present

## 2020-07-01 DIAGNOSIS — M549 Dorsalgia, unspecified: Secondary | ICD-10-CM | POA: Diagnosis not present

## 2020-07-02 ENCOUNTER — Encounter: Payer: Self-pay | Admitting: Family Medicine

## 2020-07-02 ENCOUNTER — Ambulatory Visit (INDEPENDENT_AMBULATORY_CARE_PROVIDER_SITE_OTHER): Payer: 59 | Admitting: Family Medicine

## 2020-07-02 ENCOUNTER — Other Ambulatory Visit: Payer: Self-pay

## 2020-07-02 VITALS — BP 158/90 | Ht 65.5 in | Wt 309.0 lb

## 2020-07-02 DIAGNOSIS — M6740 Ganglion, unspecified site: Secondary | ICD-10-CM

## 2020-07-02 NOTE — Progress Notes (Addendum)
   PCP: Velna Ochs, MD  Subjective:   HPI: Denise Stevens is a 54 y.o. female with history of dorsal wrist ganglion cyst here for evaluation of a mass on her left hand.  Denise Stevens reports that she first noticed this a few weeks ago, the mass is a small approximately 1 cm hard nodule which is on the radial aspect of her left first MCP joint.  She has noticed pain when she is moving her MCP joint which she does a lot at work when she is typing.  She has tried using a compression sleeve that she found on the Internet, which has not been very helpful and in fact is been somewhat painful.  She has not had any numbness or tingling, no weakness.   Review of Systems:  Per HPI.   [place], medications and smoking status reviewed.      Objective:  Physical Exam:  No flowsheet data found.   Gen: awake, alert, NAD, comfortable in exam room Pulm: breathing unlabored  Left wrist:  Inspection: No evidence of erythema, ecchymosis, swelling, edema.  She does have a small about 0.5 x 0.5 cm rubbery mobile mass overlying the radial aspect of the first MCP joint on the left hand.  No erythema or warmth around this mass. ROM: Intact ROM to wrist flexion/extension, ulnar/radial deviation w/o pain  Strength: 5/5 strength to resisted wrist flexion/extension, ulnar/radial deviation  Palpation: She does not have any significant tenderness on palpation of the mass, some mild tenderness to palpation of the extensor tendons of the thumb Special tests: Neg axial load to scaphoid.  Positive Finkelstein's.  Limited ultrasound examination of the left wrist: Ultrasound examination of the mass shows a small approximately 0.3 x 0.3 cm anechoic cyst with a single septation overlying the first MCP joint with a small stalk extending into the MCP joint.  Impression: -Small 0.3 x 0.3 cm mucoid cyst   Assessment & Plan:  1.  Mucoid cyst of left first MCP joint  Denise Stevens was signed symptoms most consistent with a mucoid cyst  versus ganglion cyst.  Given the communication with the joint is most likely a mucoid cyst.  We discussed options, Denise Stevens elected to proceed with needle fenestration.  This was performed in the office without any complications, we will plan to keep the cyst compressed to encourage continued decompression over the next 5 to 7 days.  Follow-up as needed if recurrence.  Procedure note: Denise Stevens's clinical status is marked by significant pain and/or functional disability.  Other conservative therapies nonparetic relief and/or not indicated.  Timeout was called and correct location of the cyst on the left hand was identified.  Recent benefits were discussed with the Denise Stevens prior to the procedure and she elected to proceed.  The area was cleaned with alcohol swab, anesthetized with ethyl chloride spray and then anesthetized with 0.5 cc of 1% lidocaine.  After allowing some time for lidocaine to take effect, a small 25-gauge needle was used to fenestrate the cyst using palpation guidance.  After fenestration, compression was applied and the cyst was notably smaller in size.  Denise Stevens tolerated the procedure well.   Denise Ligas, MD [facility] Fellow 07/02/2020 12:22 PM  I observed and examined the Denise Stevens with the [facility] resident and agree with assessment and plan.  Note reviewed and modified by me. Denise Mcgill, MD

## 2020-07-08 ENCOUNTER — Ambulatory Visit: Payer: 59 | Admitting: Sports Medicine

## 2020-07-14 ENCOUNTER — Other Ambulatory Visit: Payer: Self-pay | Admitting: Internal Medicine

## 2020-07-14 MED FILL — DULoxetine HCL 30 MG CPEP: 30 | 90 days supply | Qty: 90 | Fill #0

## 2020-07-14 MED FILL — LOSARTAN-HCTZ 100-25 MG TAB: 100-25 | 90 days supply | Qty: 90 | Fill #1

## 2020-07-15 DIAGNOSIS — R293 Abnormal posture: Secondary | ICD-10-CM | POA: Diagnosis not present

## 2020-07-15 DIAGNOSIS — M791 Myalgia, unspecified site: Secondary | ICD-10-CM | POA: Diagnosis not present

## 2020-07-15 DIAGNOSIS — M542 Cervicalgia: Secondary | ICD-10-CM | POA: Diagnosis not present

## 2020-07-15 DIAGNOSIS — M549 Dorsalgia, unspecified: Secondary | ICD-10-CM | POA: Diagnosis not present

## 2020-07-16 ENCOUNTER — Other Ambulatory Visit: Payer: Self-pay | Admitting: Internal Medicine

## 2020-07-16 DIAGNOSIS — Z1231 Encounter for screening mammogram for malignant neoplasm of breast: Secondary | ICD-10-CM

## 2020-07-28 ENCOUNTER — Ambulatory Visit
Admission: RE | Admit: 2020-07-28 | Discharge: 2020-07-28 | Disposition: A | Discharge status: Home / Self Care | Payer: 59 | Source: Ambulatory Visit

## 2020-07-28 ENCOUNTER — Other Ambulatory Visit: Payer: Self-pay

## 2020-07-28 DIAGNOSIS — Z1231 Encounter for screening mammogram for malignant neoplasm of breast: Secondary | ICD-10-CM | POA: Diagnosis not present

## 2020-07-29 ENCOUNTER — Other Ambulatory Visit: Payer: Self-pay | Admitting: Internal Medicine

## 2020-07-29 MED ORDER — PANTOPRAZOLE SODIUM 40 MG PO TBEC: 40.0000 mg | DELAYED_RELEASE_TABLET | Freq: Two times a day (BID) | ORAL | 3 refills | Status: DC

## 2020-07-29 MED FILL — PANTOPRAZOLE SOD DR 40 MG T: 40 | 90 days supply | Qty: 180 | Fill #0

## 2020-07-29 MED FILL — JANUVIA 100 MG TABLET: 100 | 90 days supply | Qty: 90 | Fill #2

## 2020-07-29 MED FILL — buPROPion HCL ER (XL) 150 M: 150 | 90 days supply | Qty: 90 | Fill #1

## 2020-07-29 MED FILL — ESCITALOPRAM 10 MG TABLET: 10 | 90 days supply | Qty: 180 | Fill #3

## 2020-08-13 ENCOUNTER — Other Ambulatory Visit: Payer: Self-pay

## 2020-08-13 ENCOUNTER — Encounter: Payer: Self-pay | Admitting: Internal Medicine

## 2020-08-13 ENCOUNTER — Ambulatory Visit (INDEPENDENT_AMBULATORY_CARE_PROVIDER_SITE_OTHER): Payer: 59 | Admitting: Internal Medicine

## 2020-08-13 DIAGNOSIS — I10 Essential (primary) hypertension: Secondary | ICD-10-CM

## 2020-08-13 DIAGNOSIS — E1169 Type 2 diabetes mellitus with other specified complication: Secondary | ICD-10-CM

## 2020-08-13 NOTE — Progress Notes (Signed)
    This is a telephone encounter between Jaci Lazier and Reymundo Poll on 08/14/2020 for DM follow up. The visit was conducted with the patient located at home and Reymundo Poll at [facility]. The patient's identity was confirmed using their DOB and current address. The patient has consented to being evaluated through a telephone encounter and understands the associated risks (an examination cannot be done and the patient may need to come in for an appointment) / benefits (allows the patient to remain at home, decreasing exposure to coronavirus). I personally spent 10 minutes on medical discussion.   CC: Diabetes   HPI:  Ms.Kayleigh D Sewell is a 55 y.o. female with past medical history outlined below. Patient was contacted for a telehealth appointment for DM. For the details of today's visit, please refer to the assessment and plan.   Review of Systems  HENT: Positive for congestion.   Respiratory: Positive for cough. Negative for shortness of breath.   Cardiovascular: Negative for chest pain.     Assessment & Plan:   See Encounters Tab for problem based charting.

## 2020-08-14 ENCOUNTER — Encounter: Payer: Self-pay | Admitting: Internal Medicine

## 2020-08-14 NOTE — Assessment & Plan Note (Signed)
Patient's appointment was changed to telehealth today due to URI symptoms and pending COVID-19 test. She is overdue for a Hgb A1c, but was uncontrolled at her last visit with Hgb A1c of 9.9. Today she reports non compliance with her insulin and victoza. She is taking her [place]. Says she does not like doing the injections and her CBGs have been high, occasionally in the 400s. She was previously on glipizide but this was stopped when she was initiated on tresiba. We discussed the option of restarting glipizde if she was not going to use her insulin, however she would like to "work on it" and reconsider in 3 months. Her son is moving in with her in the near future and we discussed having him help with her injections and holding her accountable. No changes today, in person follow up once feeling better.

## 2020-08-14 NOTE — Assessment & Plan Note (Signed)
Has a BP cuff at home but does not check regularly. Reports compliance with losartan-HCTZ. Needs BMP at next hospital follow up.

## 2020-08-20 ENCOUNTER — Encounter: Payer: Self-pay | Admitting: Internal Medicine

## 2020-08-20 DIAGNOSIS — Z1152 Encounter for screening for COVID-19: Secondary | ICD-10-CM | POA: Diagnosis not present

## 2020-08-21 ENCOUNTER — Other Ambulatory Visit: Payer: Self-pay | Admitting: Internal Medicine

## 2020-08-21 MED ORDER — PRAVASTATIN SODIUM 40 MG PO TABS: 40.0000 mg | ORAL_TABLET | Freq: Every day | ORAL | 3 refills | Status: DC

## 2020-08-21 MED ORDER — FLUTICASONE PROPIONATE 50 MCG/ACT NA SUSP
1.0000 | Freq: Two times a day (BID) | NASAL | 3 refills | Status: DC
Start: 2020-08-21 — End: 2020-08-21

## 2020-08-21 MED FILL — PRAVASTATIN NA 40 MG TAB: 40 | 90 days supply | Qty: 90 | Fill #0

## 2020-08-21 MED FILL — FLUTICASONE PROP 50 MCG SPR: 50 | 90 days supply | Qty: 48 | Fill #0

## 2020-08-28 ENCOUNTER — Other Ambulatory Visit: Payer: Self-pay | Admitting: Internal Medicine

## 2020-08-28 MED FILL — FREESTYLE LANCETS: 90 days supply | Qty: 100 | Fill #0

## 2020-09-01 ENCOUNTER — Other Ambulatory Visit: Payer: Self-pay

## 2020-09-01 ENCOUNTER — Ambulatory Visit: Payer: 59 | Admitting: Podiatry

## 2020-09-01 ENCOUNTER — Other Ambulatory Visit: Payer: Self-pay | Admitting: Podiatry

## 2020-09-01 DIAGNOSIS — L600 Ingrowing nail: Secondary | ICD-10-CM | POA: Diagnosis not present

## 2020-09-01 MED ORDER — GENTAMICIN SULFATE 0.1 % EX CREA: 1.0000 "application " | TOPICAL_CREAM | Freq: Two times a day (BID) | CUTANEOUS | 1 refills | Status: DC

## 2020-09-01 MED FILL — GENTAMICIN 0.1% CREAM: 0.1 | 15 days supply | Qty: 30 | Fill #0

## 2020-09-01 NOTE — Patient Instructions (Signed)

## 2020-09-03 ENCOUNTER — Encounter: Payer: Self-pay | Admitting: Podiatry

## 2020-09-12 NOTE — Progress Notes (Signed)
   Subjective: Patient presents today for evaluation of pain to the medial border left great toe. Patient is concerned for possible ingrown nail.  She states that she has noticed some purulence and discoloration that is causing pain.  This is been ongoing for approximately 1-2 months now.  Patient presents today for further treatment and evaluation.  Past Medical History:  Diagnosis Date  . Asthma    no intubation or hospitalization hx  . Depression   . Diabetes mellitus   . GERD (gastroesophageal reflux disease)   . HTN (hypertension)   . Hyperlipidemia     Objective:  General: Well developed, nourished, in no acute distress, alert and oriented x3   Dermatology: Skin is warm, dry and supple bilateral.  Medial border left great toe appears to be erythematous with evidence of an ingrowing nail. Pain on palpation noted to the border of the nail fold. The remaining nails appear unremarkable at this time. There are no open sores, lesions.  Vascular: Dorsalis Pedis artery and Posterior Tibial artery pedal pulses palpable. No lower extremity edema noted.   Neruologic: Grossly intact via light touch bilateral.  Musculoskeletal: Muscular strength within normal limits in all groups bilateral. Normal range of motion noted to all pedal and ankle joints.   Assesement: #1 Paronychia with ingrowing nail medial border left great toe #2 Pain in toe  Plan of Care:  1. Patient evaluated.  2. Discussed treatment alternatives and plan of care. Explained nail avulsion procedure and post procedure course to patient. 3. Patient opted for permanent partial nail avulsion of the medial border left great toe.  4. Prior to procedure, local anesthesia infiltration utilized using 3 ml of a 50:50 mixture of 2% plain lidocaine and 0.5% plain marcaine in a normal hallux block fashion and a betadine prep performed.  5. Partial permanent nail avulsion with chemical matrixectomy performed using [id number] applications  of phenol followed by alcohol flush.  6. Light dressing applied. 7.  Prescription for gentamicin cream  8.  Return to clinic 2 weeks.  Edrick Kins, DPM [facility]  Dr. Edrick Kins, DPM    [address], [place] [address]                Office [phone]  Fax [phone]

## 2020-09-15 ENCOUNTER — Ambulatory Visit: Payer: 59 | Admitting: Podiatry

## 2020-09-15 ENCOUNTER — Other Ambulatory Visit: Payer: Self-pay

## 2020-09-15 ENCOUNTER — Other Ambulatory Visit: Payer: Self-pay | Admitting: Podiatry

## 2020-09-15 DIAGNOSIS — L600 Ingrowing nail: Secondary | ICD-10-CM

## 2020-09-15 MED ORDER — DOXYCYCLINE HYCLATE 100 MG PO TABS: 100.0000 mg | ORAL_TABLET | Freq: Two times a day (BID) | ORAL | 0 refills | Status: DC

## 2020-09-15 MED FILL — DOXYCYCLINE HYCLATE 100 MG: 100 | 10 days supply | Qty: 20 | Fill #0

## 2020-09-15 NOTE — Progress Notes (Signed)
   Subjective: 55 y.o. female presents today status post permanent nail avulsion procedure of the medial border left great toe that was performed on 09/01/2020.  Patient states that she continues to have some tenderness to the area.  She has been soaking her foot and applying antibiotic ointment as instructed.  No new plaints at this time  Past Medical History:  Diagnosis Date  . Asthma    no intubation or hospitalization hx  . Depression   . Diabetes mellitus   . GERD (gastroesophageal reflux disease)   . HTN (hypertension)   . Hyperlipidemia     Objective: Skin is warm, dry and supple. Nail and respective nail fold appears to be healing appropriately. Open wound to the associated nail fold with a granular wound base and moderate amount of fibrotic tissue. Minimal drainage noted. Mild erythema around the periungual region likely due to phenol chemical matricectomy.  Assessment: #1 postop permanent partial nail avulsion medial border left great toe #2 open wound periungual nail fold of respective digit.   Plan of care: #1 patient was evaluated  #2 debridement of open wound was performed to the periungual border of the respective toe using a currette. Antibiotic ointment and Band-Aid was applied. #3  Prescription for doxycycline 100 mg 2 times daily #20  #4 patient is to return to clinic if her symptoms do not resolve over the next week or 2   Edrick Kins, DPM [facility]  Dr. Edrick Kins, DPM    [address], [place] [address]                Office [phone]  Fax [phone]

## 2020-09-23 ENCOUNTER — Other Ambulatory Visit: Payer: Self-pay | Admitting: Internal Medicine

## 2020-09-23 DIAGNOSIS — B373 Candidiasis of vulva and vagina: Secondary | ICD-10-CM

## 2020-09-23 DIAGNOSIS — B3731 Acute candidiasis of vulva and vagina: Secondary | ICD-10-CM

## 2020-09-23 MED ORDER — FLUCONAZOLE 150 MG PO TABS: ORAL_TABLET | ORAL | 0 refills | Status: DC

## 2020-09-23 MED FILL — FLUCONAZOLE 150 MG TABS: 150 | 3 days supply | Qty: 2 | Fill #0

## 2020-09-23 MED FILL — CHLORHEXIDINE 0.12% RINSE: 0.12 | 17 days supply | Qty: 473 | Fill #0

## 2020-09-23 MED FILL — AMOXICILLIN 500 MG CAPSULE: 500 | 7 days supply | Qty: 21 | Fill #0

## 2020-09-23 NOTE — Progress Notes (Signed)
Sent prescription for diflucan. Patient s/p dental procedure today and is taking amoxicillin. Instructed to take only after finishing antibiotics if she develops symptoms of vaginal yeast infection.

## 2020-10-28 ENCOUNTER — Other Ambulatory Visit (HOSPITAL_COMMUNITY): Payer: Self-pay | Admitting: Neurological Surgery

## 2020-10-28 DIAGNOSIS — I10 Essential (primary) hypertension: Secondary | ICD-10-CM | POA: Diagnosis not present

## 2020-10-28 DIAGNOSIS — M542 Cervicalgia: Secondary | ICD-10-CM | POA: Diagnosis not present

## 2020-10-28 DIAGNOSIS — Z6841 Body Mass Index (BMI) 40.0 and over, adult: Secondary | ICD-10-CM | POA: Diagnosis not present

## 2020-10-28 DIAGNOSIS — M545 Low back pain, unspecified: Secondary | ICD-10-CM | POA: Diagnosis not present

## 2020-10-28 DIAGNOSIS — G8929 Other chronic pain: Secondary | ICD-10-CM | POA: Diagnosis not present

## 2020-10-28 MED FILL — NAPROXEN SODIUM 550 MG TABS: 550 | 30 days supply | Qty: 60 | Fill #0

## 2020-11-05 ENCOUNTER — Ambulatory Visit: Payer: 59 | Admitting: Internal Medicine

## 2020-11-05 ENCOUNTER — Other Ambulatory Visit: Payer: Self-pay | Admitting: Internal Medicine

## 2020-11-05 ENCOUNTER — Encounter: Payer: Self-pay | Admitting: Internal Medicine

## 2020-11-05 ENCOUNTER — Other Ambulatory Visit: Payer: Self-pay

## 2020-11-05 VITALS — BP 141/69 | HR 99 | Temp 98.1°F | Ht 65.0 in | Wt 302.8 lb

## 2020-11-05 DIAGNOSIS — L03039 Cellulitis of unspecified toe: Secondary | ICD-10-CM

## 2020-11-05 DIAGNOSIS — E1169 Type 2 diabetes mellitus with other specified complication: Secondary | ICD-10-CM

## 2020-11-05 DIAGNOSIS — I10 Essential (primary) hypertension: Secondary | ICD-10-CM

## 2020-11-05 DIAGNOSIS — G8929 Other chronic pain: Secondary | ICD-10-CM

## 2020-11-05 DIAGNOSIS — E785 Hyperlipidemia, unspecified: Secondary | ICD-10-CM | POA: Diagnosis not present

## 2020-11-05 DIAGNOSIS — F419 Anxiety disorder, unspecified: Secondary | ICD-10-CM

## 2020-11-05 DIAGNOSIS — K219 Gastro-esophageal reflux disease without esophagitis: Secondary | ICD-10-CM

## 2020-11-05 LAB — POCT GLYCOSYLATED HEMOGLOBIN (HGB A1C): Hemoglobin A1C: 10.4 % — AB (ref 4.0–5.6)

## 2020-11-05 LAB — GLUCOSE, CAPILLARY: Glucose-Capillary: 244 mg/dL — ABNORMAL HIGH (ref 70–99)

## 2020-11-05 MED ORDER — PANTOPRAZOLE SODIUM 40 MG PO TBEC: 40.0000 mg | DELAYED_RELEASE_TABLET | Freq: Two times a day (BID) | ORAL | 3 refills | Status: DC

## 2020-11-05 MED ORDER — XULTOPHY 100-3.6 UNIT-MG/ML ~~LOC~~ SOPN: 16.0000 [IU] | PEN_INJECTOR | Freq: Every day | SUBCUTANEOUS | 2 refills | Status: DC

## 2020-11-05 MED ORDER — GABAPENTIN 300 MG PO CAPS: 300.0000 mg | ORAL_CAPSULE | Freq: Two times a day (BID) | ORAL | 3 refills | Status: DC

## 2020-11-05 MED ORDER — CEPHALEXIN 500 MG PO CAPS
500.0000 mg | ORAL_CAPSULE | Freq: Four times a day (QID) | ORAL | 0 refills | Status: DC
Start: 2020-11-05 — End: 2020-11-05

## 2020-11-05 MED ORDER — DULOXETINE HCL 30 MG PO CPEP
30.0000 mg | ORAL_CAPSULE | Freq: Every day | ORAL | 3 refills | Status: DC
Start: 2020-11-05 — End: 2020-11-05

## 2020-11-05 MED ORDER — ESCITALOPRAM OXALATE 20 MG PO TABS: 20.0000 mg | ORAL_TABLET | Freq: Every day | ORAL | 11 refills | Status: DC

## 2020-11-05 MED FILL — DULoxetine HCL 30 MG CPEP: 30 | 90 days supply | Qty: 90 | Fill #0

## 2020-11-05 MED FILL — ESCITALOPRAM 20 MG TABLET: 20 | 30 days supply | Qty: 30 | Fill #0

## 2020-11-05 MED FILL — CEPHALEXIN 500 MG CAPSULE: 500 | 5 days supply | Qty: 20 | Fill #0

## 2020-11-05 MED FILL — GABAPENTIN 300 MG CAPSULE: 300 | 90 days supply | Qty: 180 | Fill #0

## 2020-11-05 NOTE — Progress Notes (Signed)
Subjective:   Patient ID: Denise Stevens female   DOB: March 07, 1966 55 y.o.   MRN: [id number]  HPI: [name] is a 55 y.o. female with past medical history outlined below here for DM follow up. For the details of today's visit, please refer to the assessment and plan.   Past Medical History:  Diagnosis Date  . Asthma    no intubation or hospitalization hx  . Depression   . Diabetes mellitus   . GERD (gastroesophageal reflux disease)   . HTN (hypertension)   . Hyperlipidemia    Current Outpatient Medications  Medication Sig Dispense Refill  . cephALEXin (KEFLEX) 500 MG capsule Take 1 capsule (500 mg total) by mouth 4 (four) times daily for 10 days. 20 capsule 0  . Insulin Degludec-Liraglutide (XULTOPHY) 100-3.6 UNIT-MG/ML SOPN Inject 16 Units into the skin daily. 15 mL 2  . acetic acid-hydrocortisone (VOSOL-HC) OTIC solution Place 4 drops into both ears 3 (three) times daily. (Patient taking differently: Place 1-2 drops into both ears 3 (three) times daily as needed (dermatitis- to be applied topically). ) 10 mL 1  . alclomethasone (ACLOVATE) 0.05 % cream Apply topically 2 (two) times daily. 30 g 0  . ALPRAZolam (XANAX) 0.25 MG tablet Take 1 tablet (0.25 mg total) by mouth at bedtime as needed for anxiety. 30 tablet 0  . Blood Glucose Monitoring Suppl (ACCU-CHEK GUIDE) w/Device KIT 1 each by Does not apply route 3 (three) times daily as needed. 1 kit 1  . buPROPion (WELLBUTRIN XL) 150 MG 24 hr tablet Take 1 tablet (150 mg total) by mouth daily. 90 tablet 3  . cyclobenzaprine (FLEXERIL) 5 MG tablet Take 1 tablet (5 mg total) by mouth 3 (three) times daily as needed for muscle spasms. 60 tablet 1  . doxycycline (VIBRA-TABS) 100 MG tablet Take 1 tablet (100 mg total) by mouth 2 (two) times daily. 20 tablet 0  . DULoxetine (CYMBALTA) 30 MG capsule Take 1 capsule (30 mg total) by mouth daily. 90 capsule 3  . escitalopram (LEXAPRO) 20 MG tablet Take 1 tablet (20 mg total) by mouth  daily. 30 tablet 11  . fluconazole (DIFLUCAN) 150 MG tablet Take one tablet for vaginal yeast infection. If symptoms persist, can take a second tablet after 3 days. 2 tablet 0  . fluticasone (FLONASE) 50 MCG/ACT nasal spray Place 1 spray into both nostrils 2 (two) times daily. 48 g 3  . gabapentin (NEURONTIN) 300 MG capsule Take 1 capsule (300 mg total) by mouth 2 (two) times daily. 180 capsule 3  . gentamicin cream (GARAMYCIN) 0.1 % Apply 1 application topically in the morning and at bedtime. 30 g 1  . glucose blood (FREESTYLE TEST STRIPS) test strip Use as instructed 100 each 12  . glucose monitoring kit (FREESTYLE) monitoring kit 1 each by Does not apply route as needed for other. 1 each 0  . Insulin Pen Needle 32G X 4 MM MISC Use to inject insulin and victoza daily 200 each 3  . Lancets (FREESTYLE) lancets USE AS INSTRUCTED 100 each 12  . losartan-hydrochlorothiazide (HYZAAR) 100-25 MG tablet TAKE 1 TABLET BY MOUTH DAILY. 90 tablet 3  . pantoprazole (PROTONIX) 40 MG tablet Take 1 tablet (40 mg total) by mouth 2 (two) times daily before a meal. 180 tablet 3  . pravastatin (PRAVACHOL) 40 MG tablet Take 1 tablet (40 mg total) by mouth daily. 90 tablet 3  . sitaGLIPtin (JANUVIA) 100 MG tablet TAKE 1 TABLET (100 MG TOTAL) BY  MOUTH DAILY. 90 tablet 3   No current facility-administered medications for this visit.   Family History  Problem Relation Age of Onset  . Cancer Father        prostate  . Hyperlipidemia Mother   . Hypertension Mother   . Kidney disease Mother   . Diabetes Mother   . Breast cancer Mother   . Obesity Sister        s/p bypass  . Heart attack Neg Hx    Social History   Socioeconomic History  . Marital status: Single    Spouse name: Not on file  . Number of children: 2  . Years of education:  Some college  . Highest education level: Not on file  Occupational History  . Occupation: [employer]  Tobacco Use  . Smoking status: Never Smoker  . Smokeless  tobacco: Never Used  Vaping Use  . Vaping Use: Never used  Substance and Sexual Activity  . Alcohol use: Yes    Alcohol/week: 0.0 standard drinks    Comment: Wine occasionally.  . Drug use: No  . Sexual activity: Yes    Partners: Male    Birth control/protection: None    Comment: 1st intercourse- 76, partners- less than , divorded  Other Topics Concern  . Not on file  Social History Narrative   Lives with 44 y/o son and boyfriend (05/11/16)   Caffeine use: daily - 20oz diet mtn dew   Social Determinants of Health   Financial Resource Strain: Not on file  Food Insecurity: Not on file  Transportation Needs: Not on file  Physical Activity: Not on file  Stress: Not on file  Social Connections: Not on file    Review of Systems: Review of Systems  Constitutional: Negative for chills and fever.     Objective:  Physical Exam:  Vitals:   11/05/20 1104  BP: (!) 141/69  Pulse: 99  Temp: 98.1 F (36.7 C)  TempSrc: Oral  SpO2: 97%  Weight: (!) 302 lb 12.8 oz (137.3 kg)  Height: 5' 5" (1.651 m)    Physical Exam Constitutional:      Appearance: Normal appearance.  Cardiovascular:     Rate and Rhythm: Normal rate and regular rhythm.     Pulses: Normal pulses.  Pulmonary:     Effort: Pulmonary effort is normal. No respiratory distress.     Breath sounds: Normal breath sounds.  Skin:    Comments: Left big toe with induration and erythema around the nail bed, no drainage   Neurological:     General: No focal deficit present.     Mental Status: She is alert and oriented to person, place, and time.      Assessment & Plan:   See Encounters Tab for problem based charting.

## 2020-11-05 NOTE — Assessment & Plan Note (Signed)
Patient has paronychia of her left great toe after excision of an ingrown toenail per podiatry in February. No evidence of abscess or drainage, however she reports intermittent drainage at home. Over the past few week then she has been on two courses of antibiotics. First amoxicillin for a dental procedure followed by a course of doxycycline for this toe infection. I suspect poor healing 2/2 to uncontrolled diabetes. We are working on this. Will treat with a short course of keflex. Continue with epsom salt soaks and compresses at home. I will have her follow up with podiatry if no improvement after keflex.

## 2020-11-05 NOTE — Assessment & Plan Note (Signed)
Excellent response to Cymbalta. Will continue this, refills sent to pharmacy. Of note, she is also on lexapro and bupropion for anxiety / depression. We discussed increased risk of serotonin syndrome with these medications. However she has been tolerating this well without issue. Will continue to monitor.

## 2020-11-05 NOTE — Assessment & Plan Note (Addendum)
Taking pravastatin 40 mg daily. Checking lipid panel.   ADDENDUM: LDL still elevated, unchanged despite pravastatin 40. Patient is agreeable to high intensity statin. Stop pravastatin, start Crestor 20 mg instead. Repeat lipid panel at follow up.

## 2020-11-05 NOTE — Patient Instructions (Addendum)
Denise Stevens,  Always great to see you :)   For your toe, I have prescribed keflex. Take this 4 times a day for 5 days. If your toe does not get better, I will have you follow up with podiatry.   Please stop your insulin and victoza. Instead I have started you on xultophy, a combination injection. Please inject 16 units once a day. Record your daily fasting morning blood sugars. We will increase by 2 units a week to achieve a goal fasting CBG of <140.   I have sent your other refills to your pharmacy and will call you with the results of your blood work.   Dr. Darnell Level

## 2020-11-05 NOTE — Assessment & Plan Note (Signed)
Mildly elevated today. Continue losartan-HCTZ 100-25 mg daily. Checking BMP. Consider adding additional agent if still elevated at follow up in 3 months.

## 2020-11-05 NOTE — Assessment & Plan Note (Signed)
Chronic and well controlled. Refilled Protonix 40 mg BID

## 2020-11-05 NOTE — Assessment & Plan Note (Signed)
Chronic and uncontrolled, Hgb A1c today is 10.4. She is prescribed Januvia, tresiba 25 units QHS, and victoza 1.8 mg daily. She admits to inconsistent compliance with her tresiba and victoza. Has gone weeks without taking either, but recently has been trying to take them daily. She has fear of needles and struggles with injecting herself. We discussed other options for therapy. Either changing to a combination injection like xultophy to reduce the number of injections, or stopping tresiba and victoza and trying oral semaglutide and glipizide instead. She reports she has tried semaglutide in the past and was unable to tolerate it due to GI side effects. She might be willing to try it again, but would prefer to start with xultophy. Unfortunately this isn't covered by her insurance. We will fill out prior authorization in attempt to get it covered. She will continue her current regimen in the meantime.  -- Prior auth for xultophy; if approved, start 16 units daily; increase by 2 units once a week until fasting AM CBGs are < 140  -- D/c tresiba and victoza if able to start xultophy  -- Continue Januvia 100 mg daily

## 2020-11-06 ENCOUNTER — Other Ambulatory Visit: Payer: Self-pay | Admitting: Internal Medicine

## 2020-11-06 ENCOUNTER — Telehealth: Payer: Self-pay | Admitting: *Deleted

## 2020-11-06 LAB — BMP8+ANION GAP
Anion Gap: 20 mmol/L — ABNORMAL HIGH (ref 10.0–18.0)
BUN/Creatinine Ratio: 11 (ref 9–23)
BUN: 9 mg/dL (ref 6–24)
CO2: 23 mmol/L (ref 20–29)
Calcium: 9.7 mg/dL (ref 8.7–10.2)
Chloride: 95 mmol/L — ABNORMAL LOW (ref 96–106)
Creatinine, Ser: 0.84 mg/dL (ref 0.57–1.00)
Glucose: 240 mg/dL — ABNORMAL HIGH (ref 65–99)
Potassium: 4.1 mmol/L (ref 3.5–5.2)
Sodium: 138 mmol/L (ref 134–144)
eGFR: 82 mL/min/{1.73_m2} (ref >59)

## 2020-11-06 LAB — LIPID PANEL
Chol/HDL Ratio: 4.1 ratio (ref 0.0–4.4)
Cholesterol, Total: 171 mg/dL (ref 100–199)
HDL: 42 mg/dL (ref >39)
LDL Chol Calc (NIH): 107 mg/dL — ABNORMAL HIGH (ref 0–99)
Triglycerides: 125 mg/dL (ref 0–149)
VLDL Cholesterol Cal: 22 mg/dL (ref 5–40)

## 2020-11-06 MED ORDER — ROSUVASTATIN CALCIUM 20 MG PO TABS: 20.0000 mg | ORAL_TABLET | Freq: Every day | ORAL | 11 refills | Status: DC

## 2020-11-06 NOTE — Telephone Encounter (Addendum)
Information was called to [facility] for PA for Xultophy.  Awaiting determination.  Sander Nephew, RN 11/06/2020 12:05 PM. PA for Xultophy was approved 11/06/2020 thru 11/05/2021.  Sander Nephew, RN 11/07/2020 8:54 AM.

## 2020-11-06 NOTE — Addendum Note (Signed)
Addended by: Jodean Lima on: 11/06/2020 02:59 PM   Modules accepted: Orders

## 2020-11-13 ENCOUNTER — Other Ambulatory Visit (HOSPITAL_COMMUNITY): Payer: Self-pay

## 2020-11-13 ENCOUNTER — Encounter: Payer: Self-pay | Admitting: Internal Medicine

## 2020-11-13 MED FILL — Insulin Degludec-Liraglutide Sol Pen-Inj 100-3.6 Unit-MG/ML: SUBCUTANEOUS | 75 days supply | Qty: 15 | Fill #0 | Status: CN

## 2020-11-14 ENCOUNTER — Other Ambulatory Visit (HOSPITAL_COMMUNITY): Payer: Self-pay

## 2020-11-14 MED FILL — Losartan Potassium & Hydrochlorothiazide Tab 100-25 MG: ORAL | 30 days supply | Qty: 30 | Fill #0 | Status: AC

## 2020-11-17 ENCOUNTER — Other Ambulatory Visit (HOSPITAL_COMMUNITY): Payer: Self-pay

## 2020-12-01 ENCOUNTER — Other Ambulatory Visit (HOSPITAL_COMMUNITY): Payer: Self-pay

## 2020-12-01 ENCOUNTER — Other Ambulatory Visit: Payer: Self-pay

## 2020-12-01 ENCOUNTER — Ambulatory Visit: Payer: 59 | Admitting: Podiatry

## 2020-12-01 DIAGNOSIS — L03032 Cellulitis of left toe: Secondary | ICD-10-CM

## 2020-12-01 MED ORDER — SULFAMETHOXAZOLE-TRIMETHOPRIM 800-160 MG PO TABS
1.0000 | ORAL_TABLET | Freq: Two times a day (BID) | ORAL | 0 refills | Status: DC
  Filled 2020-12-01: qty 20, 10d supply, fill #0

## 2020-12-01 NOTE — Progress Notes (Signed)
   Subjective: Patient presents today for follow-up evaluation of pain and sensitivity to the medial border of the left great toe.  Patient had nail matricectomy procedure performed in January 2022.  She has had some pain and sensitivity ever since.  She presents today for further treatment and evaluation.  The pain is been exacerbated over the last 2 weeks.  Past Medical History:  Diagnosis Date  . Asthma    no intubation or hospitalization hx  . Depression   . Diabetes mellitus   . GERD (gastroesophageal reflux disease)   . HTN (hypertension)   . Hyperlipidemia     Objective:  General: Well developed, nourished, in no acute distress, alert and oriented x3   Dermatology: Skin is warm, dry and supple bilateral.  Medial border left great toe appears to be erythematous with evidence of an ingrowing nail with possible abscess. Pain on palpation noted to the border of the nail fold. The remaining nails appear unremarkable at this time. There are no open sores, lesions.  Vascular: Dorsalis Pedis artery and Posterior Tibial artery pedal pulses palpable. No lower extremity edema noted.   Neruologic: Grossly intact via light touch bilateral.  Musculoskeletal: Muscular strength within normal limits in all groups bilateral. Normal range of motion noted to all pedal and ankle joints.   Assesement: #1  Recurrent paronychia with ingrowing nail medial border left great toe #2  Paronychia with abscess medial border left great toe  Plan of Care:  1. Patient evaluated.  2. Discussed treatment alternatives and plan of care. Explained nail avulsion procedure and post procedure course to patient. 3. Patient opted for incision and drainage of the medial border left great toe.  4. Prior to procedure, local anesthesia infiltration utilized using 3 ml of a 50:50 mixture of 2% plain lidocaine and 0.5% plain marcaine in a normal hallux block fashion and a betadine prep performed.  5.  The soft tissue  inflammation was explored and any inflammatory tissue and residual tissue within the nail avulsion site was removed and and purulence was drained from the area 6. Light dressing applied. 7.  Prescription for Bactrim DS #20 8.  Return to clinic 2 weeks.  *Moving toward [place] to be with her mother.  We will find out today if she is allowed to work from home  Edrick Kins, DPM [facility]  Dr. Edrick Kins, DPM    [address], [place] [address]                Office [phone]  Fax [phone]

## 2020-12-17 ENCOUNTER — Other Ambulatory Visit: Payer: Self-pay

## 2020-12-17 ENCOUNTER — Ambulatory Visit: Payer: 59 | Admitting: Podiatry

## 2020-12-17 DIAGNOSIS — K589 Irritable bowel syndrome without diarrhea: Secondary | ICD-10-CM | POA: Insufficient documentation

## 2020-12-17 DIAGNOSIS — L03032 Cellulitis of left toe: Secondary | ICD-10-CM | POA: Diagnosis not present

## 2020-12-17 DIAGNOSIS — R194 Change in bowel habit: Secondary | ICD-10-CM | POA: Insufficient documentation

## 2020-12-17 DIAGNOSIS — L600 Ingrowing nail: Secondary | ICD-10-CM | POA: Diagnosis not present

## 2020-12-17 NOTE — Progress Notes (Signed)
   Subjective: 55 y.o. female presents today status post permanent nail avulsion procedure of the medial border left great toe that was performed on 12/01/2020.  Patient states she is feeling much better.  She feels that the ingrown area has resolved.  No new complaints at this time  Past Medical History:  Diagnosis Date  . Asthma    no intubation or hospitalization hx  . Depression   . Diabetes mellitus   . GERD (gastroesophageal reflux disease)   . HTN (hypertension)   . Hyperlipidemia     Objective: Skin is warm, dry and supple. Nail and respective nail fold appears to be healing appropriately. Open wound to the associated nail fold with a granular wound base and moderate amount of fibrotic tissue. Minimal drainage noted. Mild erythema around the periungual region likely due to phenol chemical matricectomy.  Assessment: #1 postop permanent partial nail avulsion medial border left great toe #2 open wound periungual nail fold of respective digit.   Plan of care: #1 patient was evaluated  #2 debridement of open wound was performed to the periungual border of the respective toe using a currette. Antibiotic ointment and Band-Aid was applied. #3 patient is to return to clinic on a PRN basis.  *Moving to [place], [place] to be with her mother end of June   Edrick Kins, DPM [facility]  Dr. Edrick Kins, [address]                                        [place], [place] [address]                Office [phone]  Fax [phone]

## 2020-12-19 ENCOUNTER — Encounter: Payer: Self-pay | Admitting: Internal Medicine

## 2020-12-19 ENCOUNTER — Other Ambulatory Visit (HOSPITAL_COMMUNITY): Payer: Self-pay

## 2020-12-19 MED ORDER — GLIPIZIDE 5 MG PO TABS
5.0000 mg | ORAL_TABLET | Freq: Every day | ORAL | 2 refills | Status: DC
  Filled 2020-12-19: qty 30, 30d supply, fill #0

## 2020-12-19 MED FILL — Lancets: 90 days supply | Qty: 100 | Fill #0 | Status: CN

## 2020-12-19 NOTE — Telephone Encounter (Signed)
Received message from patient regarding uncontrolled CBGs. Requesting to restart glipizide. Sent prescription to [facility] outpatient pharmacy. 5 mg daily. She has follow up scheduled with me later this month.

## 2020-12-29 ENCOUNTER — Other Ambulatory Visit (HOSPITAL_COMMUNITY): Payer: Self-pay

## 2020-12-30 ENCOUNTER — Other Ambulatory Visit: Payer: Self-pay | Admitting: Internal Medicine

## 2020-12-30 ENCOUNTER — Encounter: Payer: Self-pay | Admitting: Internal Medicine

## 2020-12-30 ENCOUNTER — Other Ambulatory Visit (HOSPITAL_COMMUNITY): Payer: Self-pay

## 2020-12-30 DIAGNOSIS — E119 Type 2 diabetes mellitus without complications: Secondary | ICD-10-CM

## 2020-12-30 MED FILL — Bupropion HCl Tab ER 24HR 150 MG: ORAL | 90 days supply | Qty: 90 | Fill #0 | Status: AC

## 2020-12-30 MED FILL — Pantoprazole Sodium EC Tab 40 MG (Base Equiv): ORAL | 90 days supply | Qty: 180 | Fill #0 | Status: AC

## 2020-12-30 MED FILL — Losartan Potassium & Hydrochlorothiazide Tab 100-25 MG: ORAL | 90 days supply | Qty: 90 | Fill #1 | Status: CN

## 2020-12-30 MED FILL — Duloxetine HCl Enteric Coated Pellets Cap 30 MG (Base Eq): ORAL | 90 days supply | Qty: 90 | Fill #0 | Status: CN

## 2020-12-31 ENCOUNTER — Other Ambulatory Visit: Payer: Self-pay

## 2020-12-31 ENCOUNTER — Ambulatory Visit (INDEPENDENT_AMBULATORY_CARE_PROVIDER_SITE_OTHER): Payer: 59 | Admitting: Internal Medicine

## 2020-12-31 ENCOUNTER — Other Ambulatory Visit (HOSPITAL_COMMUNITY): Payer: Self-pay

## 2020-12-31 ENCOUNTER — Encounter: Payer: Self-pay | Admitting: Internal Medicine

## 2020-12-31 VITALS — BP 140/73 | HR 99 | Temp 98.2°F | Ht 65.0 in | Wt 307.3 lb

## 2020-12-31 DIAGNOSIS — I10 Essential (primary) hypertension: Secondary | ICD-10-CM

## 2020-12-31 DIAGNOSIS — U071 COVID-19: Secondary | ICD-10-CM | POA: Diagnosis not present

## 2020-12-31 DIAGNOSIS — E1169 Type 2 diabetes mellitus with other specified complication: Secondary | ICD-10-CM

## 2020-12-31 LAB — POCT GLYCOSYLATED HEMOGLOBIN (HGB A1C): Hemoglobin A1C: 11.2 % — AB (ref 4.0–5.6)

## 2020-12-31 LAB — GLUCOSE, CAPILLARY: Glucose-Capillary: 316 mg/dL — ABNORMAL HIGH (ref 70–99)

## 2020-12-31 MED ORDER — LOSARTAN POTASSIUM-HCTZ 100-25 MG PO TABS
1.0000 | ORAL_TABLET | Freq: Every day | ORAL | 3 refills | Status: AC
  Filled 2020-12-31: qty 90, 90d supply, fill #0

## 2020-12-31 MED ORDER — GLIPIZIDE 10 MG PO TABS
10.0000 mg | ORAL_TABLET | Freq: Every day | ORAL | 1 refills | Status: AC
  Filled 2020-12-31: qty 90, 90d supply, fill #0

## 2020-12-31 MED ORDER — TRESIBA FLEXTOUCH 100 UNIT/ML ~~LOC~~ SOPN
25.0000 [IU] | PEN_INJECTOR | Freq: Every day | SUBCUTANEOUS | 5 refills | Status: AC
  Filled 2020-12-31: qty 9, 36d supply, fill #0
  Filled 2020-12-31: qty 21, 84d supply, fill #0

## 2020-12-31 MED ORDER — DAPAGLIFLOZIN PROPANEDIOL 5 MG PO TABS
5.0000 mg | ORAL_TABLET | Freq: Every day | ORAL | 2 refills | Status: DC
  Filled 2020-12-31: qty 30, 30d supply, fill #0
  Filled 2021-01-26: qty 30, 30d supply, fill #1

## 2020-12-31 NOTE — Assessment & Plan Note (Signed)
Chronic and uncontrolled.  Hemoglobin A1c today is 11.2, up from 10.4 two months ago.  She has been under a lot of stress recently caring for her mother who's health is declining.  She has made the decision to move in permanently with her mother to provide 24-hour care.  Over the past few months she has been driving back and forth from [facility] to [facility] multiple times a week to provide care, which is a 2-hour drive.  Because of this she has had difficulty keeping up with her own health and medication compliance.  We recently restarted glipizide 5 mg daily per patient request due hyperglycemia on home glucometer readings.  She reports compliance with Januvia, however has not been taking [name] or her Trulicity.  She continues to struggle with injection medications and prefers pills whenever possible.  We attempted to switch her to [facility], however this medication was unaffordable even after prior authorization.  Since she is already on a DPP 4 inhibitor, I think we can hold Trulicity since it's unlikely to provide much additional benefit and will help reduce the number of injections.  Instead, she is agreeable to starting an SGLT2 inhibitor.  Discussed the risk of UTIs and vaginal candidiasis.  She reports this is not a frequent issue for her and is willing to try the medication.  We will also increase her glipizide to 10 mg daily.  She was instructed to continue checking fasting CBGs once daily in the morning. -- Continue Januvia 100 mg daily - Increase glipizide to 10 mg daily - Start Farxiga 5 mg daily - Restart Tresiba 25 units daily - Follow-up with me or another provider in 3 months for repeat hemoglobin A1c check

## 2020-12-31 NOTE — Progress Notes (Signed)
Subjective:   Patient ID: Denise Stevens female   DOB: November 14, 1965 55 y.o.   MRN: [id number]  HPI: Denise Stevens is a 55 y.o. female with past medical history outlined below here for follow up of her chronic medical conditions. For the details of today's visit, please refer to the assessment and plan.   Past Medical History:  Diagnosis Date  . Asthma    no intubation or hospitalization hx  . Depression   . Diabetes mellitus   . GERD (gastroesophageal reflux disease)   . HTN (hypertension)   . Hyperlipidemia    Current Outpatient Medications  Medication Sig Dispense Refill  . Acetaminophen 500 MG capsule Take by mouth.    Marland Kitchen acetic acid-hydrocortisone (VOSOL-HC) OTIC solution Place 4 drops into both ears 3 (three) times daily. (Patient taking differently: Place 1-2 drops into both ears 3 (three) times daily as needed (dermatitis- to be applied topically).) 10 mL 1  . alclomethasone (ACLOVATE) 0.05 % cream Apply topically 2 (two) times daily. 30 g 0  . ALPRAZolam (XANAX) 0.25 MG tablet Take 1 tablet (0.25 mg total) by mouth at bedtime as needed for anxiety. 30 tablet 0  . amoxicillin (AMOXIL) 500 MG capsule TAKE 1 CAPSULE BY MOUTH 3 TIMES A DAY UNTIL GONE 21 capsule 0  . Blood Glucose Monitoring Suppl (ACCU-CHEK GUIDE) w/Device KIT 1 each by Does not apply route 3 (three) times daily as needed. 1 kit 1  . buPROPion (WELLBUTRIN XL) 150 MG 24 hr tablet TAKE 1 TABLET (150 MG TOTAL) BY MOUTH DAILY. 90 tablet 3  . cephALEXin (KEFLEX) 500 MG capsule TAKE 1 CAPSULE (500 MG TOTAL) BY MOUTH 4 (FOUR) TIMES DAILY FOR 5 DAYS. 20 capsule 0  . chlorhexidine (PERIDEX) 0.12 % solution AFTER BRUSHING, RINSE WITH ONE CAPFUL FOR ONE MINUTE TWICE A DAY THEN SPIT OUT 473 mL 0  . cyclobenzaprine (FLEXERIL) 5 MG tablet Take 1 tablet (5 mg total) by mouth 3 (three) times daily as needed for muscle spasms. 60 tablet 1  . cycloSPORINE (RESTASIS) 0.05 % ophthalmic emulsion Apply to eye.    Marland Kitchen doxycycline  (VIBRA-TABS) 100 MG tablet TAKE 1 TABLET (100 MG TOTAL) BY MOUTH 2 (TWO) TIMES DAILY. 20 tablet 0  . DULoxetine (CYMBALTA) 30 MG capsule TAKE 1 CAPSULE (30 MG TOTAL) BY MOUTH DAILY. 90 capsule 3  . escitalopram (LEXAPRO) 20 MG tablet TAKE 1 TABLET (20 MG TOTAL) BY MOUTH DAILY. 30 tablet 11  . fluconazole (DIFLUCAN) 150 MG tablet Take by mouth.    . fluticasone (FLONASE) 50 MCG/ACT nasal spray PLACE 1 SPRAY INTO BOTH NOSTRILS 2 (TWO) TIMES DAILY. 48 g 3  . gabapentin (NEURONTIN) 300 MG capsule TAKE 1 CAPSULE BY MOUTH TWICE DAILY. 180 capsule 3  . gentamicin cream (GARAMYCIN) 0.1 % APPLY TOPICALLY TO AFFECTED AREA IN THE MORNING AND AT BEDTIME. 30 g 1  . glipiZIDE (GLUCOTROL) 5 MG tablet Take 1 tablet (5 mg total) by mouth daily before breakfast. 30 tablet 2  . glucose blood (FREESTYLE TEST STRIPS) test strip Use as instructed 100 each 12  . glucose monitoring kit (FREESTYLE) monitoring kit 1 each by Does not apply route as needed for other. 1 each 0  . Insulin Degludec-Liraglutide (XULTOPHY) 100-3.6 UNIT-MG/ML SOPN INJECT 16 UNITS INTO THE SKIN DAILY. 15 mL 2  . Insulin Pen Needle 32G X 4 MM MISC Use to inject insulin and victoza daily 200 each 3  . Lancets (FREESTYLE) lancets USE AS INSTRUCTED 100 each 12  .  losartan-hydrochlorothiazide (HYZAAR) 100-25 MG tablet TAKE 1 TABLET BY MOUTH DAILY. 90 tablet 3  . naproxen sodium (ANAPROX) 550 MG tablet TAKE 1 TABLET BY MOUTH EVERY 12 HOURS AS NEEDED 60 tablet 2  . pantoprazole (PROTONIX) 40 MG tablet TAKE 1 TABLET BY MOUTH TWICE DAILY BEFORE MEALS. 180 tablet 0  . pravastatin (PRAVACHOL) 40 MG tablet Take by mouth.    . rosuvastatin (CRESTOR) 20 MG tablet TAKE 1 TABLET (20 MG TOTAL) BY MOUTH DAILY. *STOP PRAVASTATIN* 30 tablet 11  . sitaGLIPtin (JANUVIA) 100 MG tablet TAKE 1 TABLET (100 MG TOTAL) BY MOUTH DAILY. 90 tablet 3  . sulfamethoxazole-trimethoprim (BACTRIM DS) 800-160 MG tablet Take 1 tablet by mouth 2 (two) times daily. 20 tablet 0   No  current facility-administered medications for this visit.   Family History  Problem Relation Age of Onset  . Cancer Father        prostate  . Hyperlipidemia Mother   . Hypertension Mother   . Kidney disease Mother   . Diabetes Mother   . Breast cancer Mother   . Obesity Sister        s/p bypass  . Heart attack Neg Hx    Social History   Socioeconomic History  . Marital status: Single    Spouse name: Not on file  . Number of children: 2  . Years of education:  Some college  . Highest education level: Not on file  Occupational History  . Occupation: [employer]  Tobacco Use  . Smoking status: Never Smoker  . Smokeless tobacco: Never Used  Vaping Use  . Vaping Use: Never used  Substance and Sexual Activity  . Alcohol use: Yes    Alcohol/week: 0.0 standard drinks    Comment: Wine occasionally.  . Drug use: No  . Sexual activity: Yes    Partners: Male    Birth control/protection: None  Other Topics Concern  . Not on file  Social History Narrative   Lives with 13 y/o son and boyfriend (05/11/16)   Caffeine use: daily - 20oz diet mtn dew   Social Determinants of Health   Financial Resource Strain: Not on file  Food Insecurity: Not on file  Transportation Needs: Not on file  Physical Activity: Not on file  Stress: Not on file  Social Connections: Not on file    Review of Systems: Review of Systems  Respiratory: Negative for shortness of breath.   Cardiovascular: Negative for chest pain.     Objective:  Physical Exam:  Vitals:   12/31/20 1021  BP: 140/73  Pulse: 99  Temp: 98.2 F (36.8 C)  TempSrc: Oral  SpO2: 100%  Weight: (!) 307 lb 4.8 oz (139.4 kg)  Height: 5' 5"  (1.651 m)    Physical Exam Constitutional:      Appearance: Normal appearance.  Cardiovascular:     Rate and Rhythm: Normal rate and regular rhythm.     Pulses: Normal pulses.  Pulmonary:     Effort: Pulmonary effort is normal. No respiratory distress.  Skin:    General:  Skin is warm and dry.  Neurological:     Mental Status: She is alert.  Psychiatric:     Comments: Tearful, depressed mood      Assessment & Plan:   See Encounters Tab for problem based charting.

## 2020-12-31 NOTE — Patient Instructions (Addendum)
Denise Stevens,  For your diabetes: - Stop injecting trulicity  - Start farxiga  - Continue [name] - Re start tresiba 25 units  - increase glipize to 10 mg daily  - Continue to check you blood glucose at least once a day, preferably fasting in the morning   Blood pressure: - I have refilled your medication    Follow up with me or another provider again in 3 months. If you have any questions or concerns, call our clinic at [phone] or after hours call [phone] and ask for the internal medicine resident on call.   Dr. Darnell Level

## 2020-12-31 NOTE — Assessment & Plan Note (Signed)
Elevated today however she ran out of her antihypertensive.  Restart losartan-HCTZ 100-25 mg daily.  BMP 2 months ago showed normal renal function.  Repeat BMP at follow-up.

## 2021-01-03 DIAGNOSIS — U071 COVID-19: Secondary | ICD-10-CM | POA: Insufficient documentation

## 2021-01-03 MED ORDER — NIRMATRELVIR/RITONAVIR (PAXLOVID)TABLET: 3.0000 | ORAL_TABLET | Freq: Two times a day (BID) | ORAL | 0 refills | Status: AC

## 2021-01-03 NOTE — Assessment & Plan Note (Signed)
Unfortunately patient became symptomatic with sinus congestion, sore throat, body aches, and generalized weakness the day after her office visit. COVID-19 PCR resulted today and is positive. She is high risk for severe disease with her uncontrolled diabetes, hypertension, and obesity. Currently denies SOB and chest pain. Offered paxlovid, patient agreeable. Sent prescription to [facility] on [address]. Patient will call back or go to the ER with any worsening symptoms.

## 2021-01-03 NOTE — Addendum Note (Signed)
Addended by: Jodean Lima on: 01/03/2021 12:01 PM   Modules accepted: Orders

## 2021-01-11 MED FILL — Sitagliptin Phosphate Tab 100 MG (Base Equiv): ORAL | 90 days supply | Qty: 90 | Fill #0 | Status: CN

## 2021-01-11 MED FILL — Escitalopram Oxalate Tab 20 MG (Base Equiv): ORAL | 30 days supply | Qty: 30 | Fill #0 | Status: CN

## 2021-01-12 ENCOUNTER — Other Ambulatory Visit (HOSPITAL_COMMUNITY): Payer: Self-pay

## 2021-01-20 ENCOUNTER — Other Ambulatory Visit (HOSPITAL_COMMUNITY): Payer: Self-pay

## 2021-01-21 ENCOUNTER — Other Ambulatory Visit (HOSPITAL_COMMUNITY): Payer: Self-pay

## 2021-01-22 ENCOUNTER — Other Ambulatory Visit (HOSPITAL_COMMUNITY): Payer: Self-pay

## 2021-01-22 MED ORDER — INSULIN PEN NEEDLE 32G X 4 MM MISC
3 refills | Status: AC
  Filled 2021-01-22: qty 100, 90d supply, fill #0

## 2021-01-22 MED ORDER — FREESTYLE LITE TEST VI STRP
ORAL_STRIP | 12 refills | Status: DC
  Filled 2021-01-22: qty 50, 50d supply, fill #0

## 2021-01-22 MED ORDER — FREESTYLE LITE W/DEVICE KIT
1.0000 | PACK | Freq: Once | 0 refills | Status: AC
  Filled 2021-01-22: qty 1, 30d supply, fill #0

## 2021-01-22 MED ORDER — FREESTYLE LANCETS MISC
12 refills | Status: AC
  Filled 2021-01-22: qty 100, 90d supply, fill #0

## 2021-01-22 NOTE — Addendum Note (Signed)
Addended by: Jodean Lima on: 01/22/2021 02:09 PM   Modules accepted: Orders

## 2021-01-23 ENCOUNTER — Other Ambulatory Visit (HOSPITAL_COMMUNITY): Payer: Self-pay

## 2021-01-23 MED ORDER — CARESTART COVID-19 HOME TEST VI KIT
PACK | 0 refills | Status: AC
  Filled 2021-01-23: qty 4, 4d supply, fill #0

## 2021-01-26 ENCOUNTER — Other Ambulatory Visit (HOSPITAL_COMMUNITY): Payer: Self-pay

## 2021-01-26 ENCOUNTER — Encounter: Payer: Self-pay | Admitting: Internal Medicine

## 2021-01-26 ENCOUNTER — Other Ambulatory Visit: Payer: Self-pay | Admitting: Internal Medicine

## 2021-01-26 MED FILL — Duloxetine HCl Enteric Coated Pellets Cap 30 MG (Base Eq): ORAL | 30 days supply | Qty: 30 | Fill #0 | Status: CN

## 2021-01-26 MED FILL — Rosuvastatin Calcium Tab 20 MG: ORAL | 90 days supply | Qty: 90 | Fill #0 | Status: AC

## 2021-01-26 MED FILL — Gabapentin Cap 300 MG: ORAL | 90 days supply | Qty: 180 | Fill #0 | Status: AC

## 2021-01-26 MED FILL — Fluticasone Propionate Nasal Susp 50 MCG/ACT: NASAL | 90 days supply | Qty: 48 | Fill #0 | Status: AC

## 2021-01-26 MED FILL — Escitalopram Oxalate Tab 20 MG (Base Equiv): ORAL | 30 days supply | Qty: 30 | Fill #0 | Status: AC

## 2021-01-26 MED FILL — Duloxetine HCl Enteric Coated Pellets Cap 30 MG (Base Eq): ORAL | 90 days supply | Qty: 90 | Fill #0 | Status: AC

## 2021-01-27 ENCOUNTER — Other Ambulatory Visit (HOSPITAL_COMMUNITY): Payer: Self-pay

## 2021-01-27 MED ORDER — SITAGLIPTIN PHOSPHATE 100 MG PO TABS
100.0000 mg | ORAL_TABLET | Freq: Every day | ORAL | 3 refills | Status: DC
  Filled 2021-01-27: qty 30, 30d supply, fill #0

## 2021-02-10 ENCOUNTER — Encounter: Payer: Self-pay | Admitting: *Deleted

## 2021-02-12 ENCOUNTER — Ambulatory Visit: Payer: 59 | Admitting: Obstetrics & Gynecology

## 2021-02-16 ENCOUNTER — Other Ambulatory Visit (HOSPITAL_COMMUNITY): Payer: Self-pay

## 2021-02-24 ENCOUNTER — Other Ambulatory Visit (HOSPITAL_COMMUNITY): Payer: Self-pay

## 2021-03-06 ENCOUNTER — Encounter: Payer: Self-pay | Admitting: Internal Medicine

## 2021-03-06 ENCOUNTER — Other Ambulatory Visit (HOSPITAL_COMMUNITY): Payer: Self-pay

## 2021-03-06 ENCOUNTER — Other Ambulatory Visit: Payer: Self-pay | Admitting: Internal Medicine

## 2021-03-06 DIAGNOSIS — E1169 Type 2 diabetes mellitus with other specified complication: Secondary | ICD-10-CM

## 2021-03-06 MED ORDER — DAPAGLIFLOZIN PROPANEDIOL 5 MG PO TABS: 5.0000 mg | ORAL_TABLET | Freq: Every day | ORAL | 2 refills | Status: DC

## 2021-03-06 MED ORDER — SITAGLIPTIN PHOSPHATE 100 MG PO TABS
100.0000 mg | ORAL_TABLET | Freq: Every day | ORAL | 3 refills | Status: AC
  Filled 2021-03-06: qty 30, 30d supply, fill #0

## 2021-03-09 ENCOUNTER — Other Ambulatory Visit (HOSPITAL_COMMUNITY): Payer: Self-pay

## 2021-03-09 ENCOUNTER — Other Ambulatory Visit: Payer: Self-pay | Admitting: Internal Medicine

## 2021-03-09 DIAGNOSIS — E1169 Type 2 diabetes mellitus with other specified complication: Secondary | ICD-10-CM

## 2021-03-10 ENCOUNTER — Other Ambulatory Visit: Payer: Self-pay | Admitting: Internal Medicine

## 2021-03-10 ENCOUNTER — Other Ambulatory Visit (HOSPITAL_COMMUNITY): Payer: Self-pay

## 2021-03-10 DIAGNOSIS — E1169 Type 2 diabetes mellitus with other specified complication: Secondary | ICD-10-CM

## 2021-03-14 ENCOUNTER — Encounter: Payer: Self-pay | Admitting: Internal Medicine

## 2021-03-14 DIAGNOSIS — F419 Anxiety disorder, unspecified: Secondary | ICD-10-CM

## 2021-03-14 DIAGNOSIS — E1169 Type 2 diabetes mellitus with other specified complication: Secondary | ICD-10-CM

## 2021-03-16 ENCOUNTER — Other Ambulatory Visit (HOSPITAL_COMMUNITY): Payer: Self-pay

## 2021-03-16 MED ORDER — DAPAGLIFLOZIN PROPANEDIOL 5 MG PO TABS
5.0000 mg | ORAL_TABLET | Freq: Every day | ORAL | 2 refills | Status: AC
  Filled 2021-03-16 (×2): qty 30, 30d supply, fill #0

## 2021-03-16 MED ORDER — ESCITALOPRAM OXALATE 20 MG PO TABS
20.0000 mg | ORAL_TABLET | Freq: Every day | ORAL | 2 refills | Status: AC
  Filled 2021-03-16: qty 30, 30d supply, fill #0

## 2021-04-14 ENCOUNTER — Other Ambulatory Visit (HOSPITAL_COMMUNITY): Payer: Self-pay

## 2021-04-22 DIAGNOSIS — M50123 Cervical disc disorder at C6-C7 level with radiculopathy: Secondary | ICD-10-CM | POA: Diagnosis not present

## 2021-04-22 DIAGNOSIS — M4802 Spinal stenosis, cervical region: Secondary | ICD-10-CM | POA: Diagnosis not present

## 2021-04-22 DIAGNOSIS — M5412 Radiculopathy, cervical region: Secondary | ICD-10-CM | POA: Diagnosis not present

## 2021-04-22 DIAGNOSIS — M47812 Spondylosis without myelopathy or radiculopathy, cervical region: Secondary | ICD-10-CM | POA: Diagnosis not present

## 2021-04-23 ENCOUNTER — Telehealth: Payer: Self-pay

## 2021-04-23 NOTE — Telephone Encounter (Signed)
PA was sent over from cover my meds for PT ()  the decision came through   and is as followed :    Pharmacy claim for Januvia '100MG'$  tablets, prescriber Nischal Dareen Piano, has been rejected.  Compare the original medication with possible alternatives: Drug Name  PA Requirement* Januvia '100MG'$  tablets Required Wilder Glade                Not Required Glimepiride                   Not Required GlipiZIDE                    Not Required GlipiZIDE ER                   Not Required GlipiZIDE-MetFORMIN HCl Not Required Janumet                      Not Required Janumet                            Not Required Janumet XR                           Not Required Janumet XR                           Not Required Janumet XR                           Not Required Jardiance                          Not Required MetFORMIN HCl                Not Required MetFORMIN HCl ER              Not Required Pioglitazone HCl          Not Required Rybelsus                         Not Required Rybelsus                      Not Required Trulicity                   Not Required Trulicity                    Not Required Victoza Not Required Victoza Not Required Alogliptin Benzoate              Required Alogliptin-Metformin HCl Required Glyxambi                   Required Jentadueto                    Required [facility]                     Required [facility]                         Required Laurey Morale  Required Onglyza                           Required Steglatro                          Required Steglujan                      Required Steglujan                           Required Tradjenta                           Required Trijardy XR                                 Required   (  So I have include the list that they have provided  to show which medication  does not need a Prior AUTH  )

## 2021-04-24 ENCOUNTER — Other Ambulatory Visit (HOSPITAL_COMMUNITY): Payer: Self-pay

## 2021-04-29 ENCOUNTER — Encounter: Payer: Self-pay | Admitting: Internal Medicine

## 2021-04-29 ENCOUNTER — Other Ambulatory Visit (HOSPITAL_COMMUNITY): Payer: Self-pay

## 2021-05-12 DIAGNOSIS — M4802 Spinal stenosis, cervical region: Secondary | ICD-10-CM | POA: Diagnosis not present

## 2021-05-12 DIAGNOSIS — M50123 Cervical disc disorder at C6-C7 level with radiculopathy: Secondary | ICD-10-CM | POA: Diagnosis not present

## 2021-05-12 DIAGNOSIS — M5412 Radiculopathy, cervical region: Secondary | ICD-10-CM | POA: Diagnosis not present

## 2021-05-12 DIAGNOSIS — M47812 Spondylosis without myelopathy or radiculopathy, cervical region: Secondary | ICD-10-CM | POA: Diagnosis not present

## 2021-12-07 ENCOUNTER — Encounter: Payer: Self-pay | Admitting: Internal Medicine

## 2022-11-25 ENCOUNTER — Other Ambulatory Visit: Payer: Self-pay | Admitting: Internal Medicine

## 2022-11-25 DIAGNOSIS — E1169 Type 2 diabetes mellitus with other specified complication: Secondary | ICD-10-CM
